# Patient Record
Sex: Female | Born: 1950 | Race: White | Hispanic: No | Smoking: Never smoker
Health system: Southern US, Community
[De-identification: ages and names within clinical notes are randomized; demographics above are authoritative.]

## PROBLEM LIST (undated history)

## (undated) DIAGNOSIS — R000 Tachycardia, unspecified: Secondary | ICD-10-CM

## (undated) DIAGNOSIS — M1990 Unspecified osteoarthritis, unspecified site: Secondary | ICD-10-CM

## (undated) HISTORY — PX: ABDOMINAL HYSTERECTOMY: SHX81

## (undated) HISTORY — DX: Tachycardia, unspecified: R00.0

## (undated) HISTORY — PX: BREAST EXCISIONAL BIOPSY: SUR124

## (undated) HISTORY — PX: OTHER SURGICAL HISTORY: SHX169

---

## 2017-04-13 ENCOUNTER — Telehealth: Payer: Self-pay | Admitting: *Deleted

## 2017-04-13 NOTE — Telephone Encounter (Signed)
Call and left message for patient to call and schedule appointment--patient requested her records be transferred to [facility]

## 2017-04-15 ENCOUNTER — Encounter: Payer: Self-pay | Admitting: Physician Assistant

## 2017-04-15 ENCOUNTER — Other Ambulatory Visit: Payer: Self-pay | Admitting: Physician Assistant

## 2017-04-15 ENCOUNTER — Telehealth: Payer: Self-pay | Admitting: Cardiovascular Disease

## 2017-04-15 DIAGNOSIS — R000 Tachycardia, unspecified: Secondary | ICD-10-CM | POA: Insufficient documentation

## 2017-04-15 MED ORDER — METOPROLOL TARTRATE 50 MG PO TABS: 75.0000 mg | ORAL_TABLET | Freq: Two times a day (BID) | ORAL | 11 refills | Status: DC

## 2017-04-15 NOTE — Telephone Encounter (Signed)
Received records from [facility] for appointment on 04/20/17 with Dr Gwenlyn Found.  Records put with Dr Kennon Holter schedule for 04/20/17. lp

## 2017-04-15 NOTE — Progress Notes (Signed)
Pt will be seeing Dr Gwenlyn Found next week as a new pt.  However, she does not have enough metoprolol to last. Spoke with Dr Gwenlyn Found and got his permission to call in a short-term rx for her.  Emphasized need to keep appt. Pt states she will do so. (she is my sister)  Lenoard Aden 04/15/2017 11:40 AM Beeper [phone]

## 2017-04-16 ENCOUNTER — Telehealth: Payer: Self-pay | Admitting: Cardiovascular Disease

## 2017-04-16 NOTE — Telephone Encounter (Signed)
On 04/16/17 received records from [facility] for upcoming appointment on 04/20/17 @ 9:45am with Dr. Gwenlyn Found. Records given to [facility]. ab

## 2017-04-20 ENCOUNTER — Encounter: Payer: Self-pay | Admitting: Cardiovascular Disease

## 2017-04-20 ENCOUNTER — Ambulatory Visit (INDEPENDENT_AMBULATORY_CARE_PROVIDER_SITE_OTHER): Payer: Medicare HMO | Admitting: Cardiovascular Disease

## 2017-04-20 ENCOUNTER — Encounter (INDEPENDENT_AMBULATORY_CARE_PROVIDER_SITE_OTHER): Payer: Self-pay

## 2017-04-20 VITALS — BP 118/84 | HR 68 | Ht 67.0 in | Wt 188.8 lb

## 2017-04-20 DIAGNOSIS — G4733 Obstructive sleep apnea (adult) (pediatric): Secondary | ICD-10-CM

## 2017-04-20 DIAGNOSIS — R002 Palpitations: Secondary | ICD-10-CM

## 2017-04-20 DIAGNOSIS — I341 Nonrheumatic mitral (valve) prolapse: Secondary | ICD-10-CM

## 2017-04-20 MED ORDER — METOPROLOL TARTRATE 50 MG PO TABS: 75.0000 mg | ORAL_TABLET | Freq: Two times a day (BID) | ORAL | 11 refills | Status: DC

## 2017-04-20 MED ORDER — HYDROXYCHLOROQUINE SULFATE 200 MG PO TABS: 400.0000 mg | ORAL_TABLET | Freq: Every day | ORAL | 3 refills | Status: AC

## 2017-04-20 NOTE — Patient Instructions (Signed)
Medication Instructions: Your physician recommends that you continue on your current medications as directed. Please refer to the Current Medication list given to you today.  Labwork: Your physician recommends that you return for FASTING lab work.  Testing/Procedures: Your physician has requested that you have an echocardiogram. Echocardiography is a painless test that uses sound waves to create images of your heart. It provides your doctor with information about the size and shape of your heart and how well your heart's chambers and valves are working. This procedure takes approximately one hour. There are no restrictions for this procedure.   Follow-Up: Your physician wants you to follow-up in: 1 year with Dr. Gwenlyn Found. You will receive a reminder letter in the mail two months in advance. If you don't receive a letter, please call our office to schedule the follow-up appointment.  Dr. Gwenlyn Found has referred you to Dr. Claiborne Billings for evaluation of sleep apnea.

## 2017-04-20 NOTE — Addendum Note (Signed)
Addended by: Zebedee Iba on: 04/20/2017 04:33 PM   Modules accepted: Orders

## 2017-04-20 NOTE — Assessment & Plan Note (Signed)
History of obstructive sleep apnea which has been evaluated in the past but she is not wearing CPAP. This may be contributing to her palpitations. I will have her reevaluated for this.

## 2017-04-20 NOTE — Progress Notes (Signed)
     04/20/2017 Ruth Sharp   1951/07/23  [id number]  Primary Physician Patient, No Pcp Per Primary Cardiologist: Lorretta Harp MD Garret Reddish, [place], [place]  HPI:  Ruth Sharp is a 66 y.o. female , separated, mother of one child with no grandchildren who worked in a gym in the past and recently relocated from [place] down to [place] approximately 3 months ago to be closer to family. She had been seeing cardiology group in [place]. She has a history of palpitations thought to be related to PACs on event monitor on low-dose beta blocker. She has mild mitral valve prolapse with mild to moderate MR by 2-D echo and a negative stress test in the past. She does have symptoms of obstructive sleep apnea as well.   Current Meds  Medication Sig  . Calcium Carbonate-Vitamin D (CALTRATE 600+D PO) Take 1 tablet by mouth daily.  . hydroxychloroquine (PLAQUENIL) 200 MG tablet Take 2 tablets by mouth daily.  . metoprolol tartrate (LOPRESSOR) 50 MG tablet Take 1.5 tablets (75 mg total) by mouth 2 (two) times daily.     Allergies  Allergen Reactions  . Keflex [Cephalexin]     Social History   Social History  . Marital status: Legally Separated    Spouse name: N/A  . Number of children: N/A  . Years of education: N/A   Occupational History  . Not on file.   Social History Main Topics  . Smoking status: Never Smoker  . Smokeless tobacco: Never Used  . Alcohol use Not on file  . Drug use: Unknown  . Sexual activity: Not on file   Other Topics Concern  . Not on file   Social History Narrative  . No narrative on file     Review of Systems: General: negative for chills, fever, night sweats or weight changes.  Cardiovascular: negative for chest pain, dyspnea on exertion, edema, orthopnea, palpitations, paroxysmal nocturnal dyspnea or shortness of breath Dermatological: negative for rash Respiratory: negative for cough or wheezing Urologic: negative for  hematuria Abdominal: negative for nausea, vomiting, diarrhea, bright red blood per rectum, melena, or hematemesis Neurologic: negative for visual changes, syncope, or dizziness All other systems reviewed and are otherwise negative except as noted above.    Blood pressure 118/84, pulse 68, height 5\' 7"  (1.702 m), weight 188 lb 12.8 oz (85.6 kg), SpO2 97 %.  General appearance: alert and no distress Neck: no adenopathy, no carotid bruit, no JVD, supple, symmetrical, trachea midline and thyroid not enlarged, symmetric, no tenderness/mass/nodules Lungs: clear to auscultation bilaterally Heart: regular rate and rhythm, S1, S2 normal, no murmur, click, rub or gallop Extremities: extremities normal, atraumatic, no cyanosis or edema  EKG sinus rhythm at 68 without ST or T-wave changes. Personally reviewed this EKG.  ASSESSMENT AND PLAN:   Tachycardia Event monitor has shown PACs in the past. She is on low-dose beta blocker for this  Mitral valve prolapse History of nonrheumatic mild mitral valve prolapse P2 by 2-D echo in the past by her primary cardiologist with moderate MR. There is no pulmonary hypertension. We will repeat a 2-D echocardiogram.  Obstructive sleep apnea History of obstructive sleep apnea which has been evaluated in the past but she is not wearing CPAP. This may be contributing to her palpitations. I will have her reevaluated for this.      Lorretta Harp MD FACP,FACC,FAHA, [facility] 04/20/2017 10:19 AM

## 2017-04-20 NOTE — Assessment & Plan Note (Signed)
History of nonrheumatic mild mitral valve prolapse P2 by 2-D echo in the past by her primary cardiologist with moderate MR. There is no pulmonary hypertension. We will repeat a 2-D echocardiogram.

## 2017-04-20 NOTE — Assessment & Plan Note (Signed)
Event monitor has shown PACs in the past. She is on low-dose beta blocker for this

## 2017-04-21 LAB — LIPID PANEL
Chol/HDL Ratio: 2.9 ratio (ref 0.0–4.4)
Cholesterol, Total: 231 mg/dL — ABNORMAL HIGH (ref 100–199)
HDL: 80 mg/dL (ref >39)
LDL Calculated: 126 mg/dL — ABNORMAL HIGH (ref 0–99)
Triglycerides: 125 mg/dL (ref 0–149)
VLDL Cholesterol Cal: 25 mg/dL (ref 5–40)

## 2017-04-21 LAB — CBC WITH DIFFERENTIAL/PLATELET
Basophils Absolute: 0 10*3/uL (ref 0.0–0.2)
Basos: 1 %
EOS (ABSOLUTE): 0.2 10*3/uL (ref 0.0–0.4)
Eos: 4 %
Hematocrit: 46.4 % (ref 34.0–46.6)
Hemoglobin: 15.2 g/dL (ref 11.1–15.9)
Immature Grans (Abs): 0 10*3/uL (ref 0.0–0.1)
Immature Granulocytes: 0 %
Lymphocytes Absolute: 1.4 10*3/uL (ref 0.7–3.1)
Lymphs: 23 %
MCH: 27.3 pg (ref 26.6–33.0)
MCHC: 32.8 g/dL (ref 31.5–35.7)
MCV: 83 fL (ref 79–97)
Monocytes Absolute: 0.4 10*3/uL (ref 0.1–0.9)
Monocytes: 6 %
Neutrophils Absolute: 3.9 10*3/uL (ref 1.4–7.0)
Neutrophils: 66 %
Platelets: 590 10*3/uL — ABNORMAL HIGH (ref 150–379)
RBC: 5.57 x10E6/uL — ABNORMAL HIGH (ref 3.77–5.28)
RDW: 14.8 % (ref 12.3–15.4)
WBC: 5.9 10*3/uL (ref 3.4–10.8)

## 2017-04-21 LAB — T4, FREE: Free T4: 1.1 ng/dL (ref 0.82–1.77)

## 2017-04-21 LAB — HEPATIC FUNCTION PANEL
ALT: 21 IU/L (ref 0–32)
AST: 29 IU/L (ref 0–40)
Albumin: 4.7 g/dL (ref 3.6–4.8)
Alkaline Phosphatase: 85 IU/L (ref 39–117)
Bilirubin Total: 0.5 mg/dL (ref 0.0–1.2)
Bilirubin, Direct: 0.13 mg/dL (ref 0.00–0.40)
Total Protein: 7.1 g/dL (ref 6.0–8.5)

## 2017-04-21 LAB — BASIC METABOLIC PANEL
BUN/Creatinine Ratio: 21 (ref 12–28)
BUN: 16 mg/dL (ref 8–27)
CO2: 21 mmol/L (ref 20–29)
Calcium: 9.7 mg/dL (ref 8.7–10.3)
Chloride: 105 mmol/L (ref 96–106)
Creatinine, Ser: 0.75 mg/dL (ref 0.57–1.00)
GFR calc Af Amer: 96 mL/min/{1.73_m2} (ref >59)
GFR calc non Af Amer: 83 mL/min/{1.73_m2} (ref >59)
Glucose: 106 mg/dL — ABNORMAL HIGH (ref 65–99)
Potassium: 5.4 mmol/L — ABNORMAL HIGH (ref 3.5–5.2)
Sodium: 142 mmol/L (ref 134–144)

## 2017-04-21 LAB — HEMOGLOBIN A1C
Est. average glucose Bld gHb Est-mCnc: 123 mg/dL
Hgb A1c MFr Bld: 5.9 % — ABNORMAL HIGH (ref 4.8–5.6)

## 2017-04-21 LAB — TSH: TSH: 1.88 u[IU]/mL (ref 0.450–4.500)

## 2017-04-26 ENCOUNTER — Other Ambulatory Visit: Payer: Self-pay | Admitting: Cardiovascular Disease

## 2017-04-26 ENCOUNTER — Encounter: Payer: Self-pay | Admitting: Cardiovascular Disease

## 2017-04-26 DIAGNOSIS — R000 Tachycardia, unspecified: Secondary | ICD-10-CM

## 2017-04-26 DIAGNOSIS — I341 Nonrheumatic mitral (valve) prolapse: Secondary | ICD-10-CM

## 2017-04-26 NOTE — Patient Instructions (Signed)

## 2017-05-04 ENCOUNTER — Other Ambulatory Visit: Payer: Self-pay

## 2017-05-04 ENCOUNTER — Ambulatory Visit (HOSPITAL_COMMUNITY): Payer: Medicare HMO | Attending: Cardiovascular Disease

## 2017-05-04 DIAGNOSIS — I341 Nonrheumatic mitral (valve) prolapse: Secondary | ICD-10-CM | POA: Insufficient documentation

## 2017-05-04 DIAGNOSIS — R002 Palpitations: Secondary | ICD-10-CM | POA: Diagnosis not present

## 2017-05-04 DIAGNOSIS — M9902 Segmental and somatic dysfunction of thoracic region: Secondary | ICD-10-CM | POA: Diagnosis not present

## 2017-05-04 DIAGNOSIS — I340 Nonrheumatic mitral (valve) insufficiency: Secondary | ICD-10-CM | POA: Insufficient documentation

## 2017-05-04 DIAGNOSIS — M791 Myalgia: Secondary | ICD-10-CM | POA: Diagnosis not present

## 2017-05-04 DIAGNOSIS — M9901 Segmental and somatic dysfunction of cervical region: Secondary | ICD-10-CM | POA: Diagnosis not present

## 2017-05-04 DIAGNOSIS — G4733 Obstructive sleep apnea (adult) (pediatric): Secondary | ICD-10-CM | POA: Diagnosis not present

## 2017-05-04 DIAGNOSIS — M47814 Spondylosis without myelopathy or radiculopathy, thoracic region: Secondary | ICD-10-CM | POA: Diagnosis not present

## 2017-05-04 DIAGNOSIS — M62830 Muscle spasm of back: Secondary | ICD-10-CM | POA: Diagnosis not present

## 2017-05-04 DIAGNOSIS — M50323 Other cervical disc degeneration at C6-C7 level: Secondary | ICD-10-CM | POA: Diagnosis not present

## 2017-05-07 DIAGNOSIS — M47814 Spondylosis without myelopathy or radiculopathy, thoracic region: Secondary | ICD-10-CM | POA: Diagnosis not present

## 2017-05-07 DIAGNOSIS — M9902 Segmental and somatic dysfunction of thoracic region: Secondary | ICD-10-CM | POA: Diagnosis not present

## 2017-05-07 DIAGNOSIS — M50323 Other cervical disc degeneration at C6-C7 level: Secondary | ICD-10-CM | POA: Diagnosis not present

## 2017-05-07 DIAGNOSIS — M9901 Segmental and somatic dysfunction of cervical region: Secondary | ICD-10-CM | POA: Diagnosis not present

## 2017-05-07 DIAGNOSIS — M791 Myalgia: Secondary | ICD-10-CM | POA: Diagnosis not present

## 2017-05-07 DIAGNOSIS — M62830 Muscle spasm of back: Secondary | ICD-10-CM | POA: Diagnosis not present

## 2017-05-10 DIAGNOSIS — M9902 Segmental and somatic dysfunction of thoracic region: Secondary | ICD-10-CM | POA: Diagnosis not present

## 2017-05-10 DIAGNOSIS — M9901 Segmental and somatic dysfunction of cervical region: Secondary | ICD-10-CM | POA: Diagnosis not present

## 2017-05-10 DIAGNOSIS — M50323 Other cervical disc degeneration at C6-C7 level: Secondary | ICD-10-CM | POA: Diagnosis not present

## 2017-05-10 DIAGNOSIS — M47814 Spondylosis without myelopathy or radiculopathy, thoracic region: Secondary | ICD-10-CM | POA: Diagnosis not present

## 2017-05-13 DIAGNOSIS — M47814 Spondylosis without myelopathy or radiculopathy, thoracic region: Secondary | ICD-10-CM | POA: Diagnosis not present

## 2017-05-13 DIAGNOSIS — M9901 Segmental and somatic dysfunction of cervical region: Secondary | ICD-10-CM | POA: Diagnosis not present

## 2017-05-13 DIAGNOSIS — M50323 Other cervical disc degeneration at C6-C7 level: Secondary | ICD-10-CM | POA: Diagnosis not present

## 2017-05-13 DIAGNOSIS — M9902 Segmental and somatic dysfunction of thoracic region: Secondary | ICD-10-CM | POA: Diagnosis not present

## 2017-05-18 DIAGNOSIS — M9901 Segmental and somatic dysfunction of cervical region: Secondary | ICD-10-CM | POA: Diagnosis not present

## 2017-05-18 DIAGNOSIS — M50323 Other cervical disc degeneration at C6-C7 level: Secondary | ICD-10-CM | POA: Diagnosis not present

## 2017-05-18 DIAGNOSIS — M9902 Segmental and somatic dysfunction of thoracic region: Secondary | ICD-10-CM | POA: Diagnosis not present

## 2017-05-18 DIAGNOSIS — M47814 Spondylosis without myelopathy or radiculopathy, thoracic region: Secondary | ICD-10-CM | POA: Diagnosis not present

## 2017-05-20 DIAGNOSIS — M9902 Segmental and somatic dysfunction of thoracic region: Secondary | ICD-10-CM | POA: Diagnosis not present

## 2017-05-20 DIAGNOSIS — M50323 Other cervical disc degeneration at C6-C7 level: Secondary | ICD-10-CM | POA: Diagnosis not present

## 2017-05-20 DIAGNOSIS — M9901 Segmental and somatic dysfunction of cervical region: Secondary | ICD-10-CM | POA: Diagnosis not present

## 2017-05-20 DIAGNOSIS — M47814 Spondylosis without myelopathy or radiculopathy, thoracic region: Secondary | ICD-10-CM | POA: Diagnosis not present

## 2017-05-24 DIAGNOSIS — M47814 Spondylosis without myelopathy or radiculopathy, thoracic region: Secondary | ICD-10-CM | POA: Diagnosis not present

## 2017-05-24 DIAGNOSIS — M50323 Other cervical disc degeneration at C6-C7 level: Secondary | ICD-10-CM | POA: Diagnosis not present

## 2017-05-24 DIAGNOSIS — M9902 Segmental and somatic dysfunction of thoracic region: Secondary | ICD-10-CM | POA: Diagnosis not present

## 2017-05-24 DIAGNOSIS — M9901 Segmental and somatic dysfunction of cervical region: Secondary | ICD-10-CM | POA: Diagnosis not present

## 2017-05-31 DIAGNOSIS — M50323 Other cervical disc degeneration at C6-C7 level: Secondary | ICD-10-CM | POA: Diagnosis not present

## 2017-05-31 DIAGNOSIS — M9902 Segmental and somatic dysfunction of thoracic region: Secondary | ICD-10-CM | POA: Diagnosis not present

## 2017-05-31 DIAGNOSIS — M47814 Spondylosis without myelopathy or radiculopathy, thoracic region: Secondary | ICD-10-CM | POA: Diagnosis not present

## 2017-05-31 DIAGNOSIS — M9901 Segmental and somatic dysfunction of cervical region: Secondary | ICD-10-CM | POA: Diagnosis not present

## 2017-06-03 DIAGNOSIS — M47814 Spondylosis without myelopathy or radiculopathy, thoracic region: Secondary | ICD-10-CM | POA: Diagnosis not present

## 2017-06-03 DIAGNOSIS — M9901 Segmental and somatic dysfunction of cervical region: Secondary | ICD-10-CM | POA: Diagnosis not present

## 2017-06-03 DIAGNOSIS — M9902 Segmental and somatic dysfunction of thoracic region: Secondary | ICD-10-CM | POA: Diagnosis not present

## 2017-06-03 DIAGNOSIS — M50323 Other cervical disc degeneration at C6-C7 level: Secondary | ICD-10-CM | POA: Diagnosis not present

## 2017-06-09 DIAGNOSIS — M9901 Segmental and somatic dysfunction of cervical region: Secondary | ICD-10-CM | POA: Diagnosis not present

## 2017-06-09 DIAGNOSIS — M9902 Segmental and somatic dysfunction of thoracic region: Secondary | ICD-10-CM | POA: Diagnosis not present

## 2017-06-09 DIAGNOSIS — M50323 Other cervical disc degeneration at C6-C7 level: Secondary | ICD-10-CM | POA: Diagnosis not present

## 2017-06-09 DIAGNOSIS — M47814 Spondylosis without myelopathy or radiculopathy, thoracic region: Secondary | ICD-10-CM | POA: Diagnosis not present

## 2017-06-10 ENCOUNTER — Encounter: Payer: Self-pay | Admitting: Cardiovascular Disease

## 2017-06-10 ENCOUNTER — Ambulatory Visit (INDEPENDENT_AMBULATORY_CARE_PROVIDER_SITE_OTHER): Payer: Medicare HMO | Admitting: Cardiovascular Disease

## 2017-06-10 VITALS — BP 120/79 | HR 69 | Ht 67.0 in | Wt 188.6 lb

## 2017-06-10 DIAGNOSIS — I341 Nonrheumatic mitral (valve) prolapse: Secondary | ICD-10-CM | POA: Diagnosis not present

## 2017-06-10 DIAGNOSIS — G4733 Obstructive sleep apnea (adult) (pediatric): Secondary | ICD-10-CM | POA: Diagnosis not present

## 2017-06-10 DIAGNOSIS — I340 Nonrheumatic mitral (valve) insufficiency: Secondary | ICD-10-CM | POA: Diagnosis not present

## 2017-06-10 DIAGNOSIS — R002 Palpitations: Secondary | ICD-10-CM | POA: Diagnosis not present

## 2017-06-10 DIAGNOSIS — I480 Paroxysmal atrial fibrillation: Secondary | ICD-10-CM | POA: Diagnosis not present

## 2017-06-10 NOTE — Patient Instructions (Signed)
Medication Instructions:  Your physician recommends that you continue on your current medications as directed. Please refer to the Current Medication list given to you today.  Testing/Procedures: Once we receive sleep study, Dr. Claiborne Billings will decide whether to order in lab study vs. home sleep study.  Follow-Up: Pending plan-after set up.  Any Other Special Instructions Will Be Listed Below (If Applicable).     If you need a refill on your cardiac medications before your next appointment, please call your pharmacy.

## 2017-06-12 NOTE — Progress Notes (Signed)
Cardiology Office Note    Date:  06/12/2017   ID:  Ruth Sharp, DOB 01/16/1951, MRN [id number]  PCP:  Patient, No Pcp Per  Cardiologist:  Shelva Majestic, MD   Chief Complaint  Patient presents with  . Follow-up    New sleep evaluation, referred by Dr. Gwenlyn Found.  History of Present Illness:  Ruth Sharp is a 66 y.o. female who is referred by Dr. Gwenlyn Found for evaluation of untreated sleep apnea.  [name] is the sister of Ruth Ferries, PA-C.  She had been living in [place] and earlier this year.  Relocated to [facility] to be closer to family.  She has a history of palpitations thought to be related to PACs on event monitoring and she has been on low-dose beta blocker therapy.  She relates a possible history of PAF. She has a history of mild mitral valve prolapse with mild-to-moderate MR by echo and remotely had a negative stress test.  Last year she apparently underwent an in lab sleep evaluation in [place] and subsequently had a CPAP titration study.  However, she was never set up with CPAP therapy and when she called the DME company.  He never returned her call and she never followed up with this.  The physician who had ordered her study was Dr. Broadus John in [place], but she never notified them that she was never set up for her CPAP.  Over the past year, she admits to some sleepiness during the day.  She typically goes to bed around midnight, wakes up at 7 to 7:30 AM.  She oftentimes wakes up 2 times per night.  Her sleep at times is nonrestorative.  She denies any bruxism, restless legs,  hypnogognic hallucinations or cataplexy. An Epworth Sleepiness Scale was calculated in the office today and this endorsed at 10 and shown below:   Epworth Sleepiness Scale: Situation   Chance of Dozing/Sleeping (0 = never , 1 = slight chance , 2 = moderate chance , 3 = high chance )   sitting and reading 1   watching TV 2   sitting inactive in a public place 1   being a  passenger in a motor vehicle for an hour or more 1   lying down in the afternoon 3   sitting and talking to someone 0   sitting quietly after lunch (no alcohol) 2   while stopped for a few minutes in traffic as the driver 0   Total Score  10   She presents for a new sleep evaluation.   Past Medical History:  Diagnosis Date  . Tachycardia     No past surgical history on file.  Current Medications: Outpatient Medications Prior to Visit  Medication Sig Dispense Refill  . Calcium Carbonate-Vitamin D (CALTRATE 600+D PO) Take 1 tablet by mouth daily.    . hydroxychloroquine (PLAQUENIL) 200 MG tablet Take 2 tablets (400 mg total) by mouth daily. 90 tablet 3  . metoprolol tartrate (LOPRESSOR) 50 MG tablet Take 1.5 tablets (75 mg total) by mouth 2 (two) times daily. 92 tablet 11   No facility-administered medications prior to visit.      Allergies:   Keflex [cephalexin]   Social History   Social History  . Marital status: Legally Separated    Spouse name: N/A  . Number of children: N/A  . Years of education: N/A   Social History Main Topics  . Smoking status: Never Smoker  . Smokeless tobacco: Never Used  .  Alcohol use None  . Drug use: Unknown  . Sexual activity: Not Asked   Other Topics Concern  . None   Social History Narrative  . None    She is separated.  She has one son age 83.  Family History:  The patient's family history includes Heart failure in her mother.  Her mother had sudden death at age 52.  Father died with melanoma.  ROS General: Negative; No fevers, chills, or night sweats;  HEENT: Negative; No changes in vision or hearing, sinus congestion, difficulty swallowing Pulmonary: Negative; No cough, wheezing, shortness of breath, hemoptysis Cardiovascular: See history of present illness GI: Negative; No nausea, vomiting, diarrhea, or abdominal pain GU: Negative; No dysuria, hematuria, or difficulty voiding Musculoskeletal: Negative; no myalgias, joint  pain, or weakness Hematologic/Oncology: Negative; no easy bruising, bleeding Endocrine: Negative; no heat/cold intolerance; no diabetes Neuro: Negative; no changes in balance, headaches Skin: Negative; No rashes or skin lesions Psychiatric: Negative; No behavioral problems, depression Sleep: See history of present illness  Other comprehensive 14 point system review is negative.   PHYSICAL EXAM:   VS:  BP 120/79   Pulse 69   Ht '5\' 7"'$  (1.702 m)   Wt 188 lb 9.6 oz (85.5 kg)   BMI 29.54 kg/m     Repeat blood pressure by me was 124/82. Wt Readings from Last 3 Encounters:  06/10/17 188 lb 9.6 oz (85.5 kg)  04/20/17 188 lb 12.8 oz (85.6 kg)    General: Alert, oriented, no distress.  Skin: normal turgor, no rashes, warm and dry HEENT: Normocephalic, atraumatic. Pupils equal round and reactive to light; sclera anicteric; extraocular muscles intact; Fundi Without hemorrhages or exudates Nose without nasal septal hypertrophy Mouth/Parynx benign; Mallinpatti scale 3 Neck: No JVD, no carotid bruits; normal carotid upstroke Lungs: clear to ausculatation and percussion; no wheezing or rales Chest wall: without tenderness to palpitation Heart: PMI not displaced, RRR, s1 s2 normal, 1/6 systolic murmur, no diastolic murmur; possible intermittent systolic click, no rubs, gallops, thrills, or heaves Abdomen: soft, nontender; no hepatosplenomehaly, BS+; abdominal aorta nontender and not dilated by palpation. Back: no CVA tenderness Pulses 2+ Musculoskeletal: full range of motion, normal strength, no joint deformities Extremities: no clubbing cyanosis or edema, Homan's sign negative  Neurologic: grossly nonfocal; Cranial nerves grossly wnl Psychologic: Normal mood and affect   Studies/Labs Reviewed:    Recent Labs: BMP Latest Ref Rng & Units 04/20/2017  Glucose 65 - 99 mg/dL 106(H)  BUN 8 - 27 mg/dL 16  Creatinine 0.57 - 1.00 mg/dL 0.75  BUN/Creat Ratio 12 - 28 21  Sodium 134 - 144 mmol/L  142  Potassium 3.5 - 5.2 mmol/L 5.4(H)  Chloride 96 - 106 mmol/L 105  CO2 20 - 29 mmol/L 21  Calcium 8.7 - 10.3 mg/dL 9.7     Hepatic Function Latest Ref Rng & Units 04/20/2017  Total Protein 6.0 - 8.5 g/dL 7.1  Albumin 3.6 - 4.8 g/dL 4.7  AST 0 - 40 IU/L 29  ALT 0 - 32 IU/L 21  Alk Phosphatase 39 - 117 IU/L 85  Total Bilirubin 0.0 - 1.2 mg/dL 0.5  Bilirubin, Direct 0.00 - 0.40 mg/dL 0.13    CBC Latest Ref Rng & Units 04/20/2017  WBC 3.4 - 10.8 x10E3/uL 5.9  Hemoglobin 11.1 - 15.9 g/dL 15.2  Hematocrit 34.0 - 46.6 % 46.4  Platelets 150 - 379 x10E3/uL 590(H)   Lab Results  Component Value Date   MCV 83 04/20/2017   Lab Results  Component Value Date   TSH 1.880 04/20/2017   Lab Results  Component Value Date   HGBA1C 5.9 (H) 04/20/2017     BNP No results found for: BNP  ProBNP No results found for: PROBNP   Lipid Panel     Component Value Date/Time   CHOL 231 (H) 04/20/2017 1033   TRIG 125 04/20/2017 1033   HDL 80 04/20/2017 1033   CHOLHDL 2.9 04/20/2017 1033   LDLCALC 126 (H) 04/20/2017 1033     RADIOLOGY: No results found.   Additional studies/ records that were reviewed today include:  I reviewed the records by Dr. Gwenlyn Found.  I've requested.  The patient's sleep studies from her pulmonologist in [place], [place] Dr. Deretha Emory.   ASSESSMENT:    No diagnosis found.   PLAN:  Ms. Mersereau is a very pleasant 66 year old female who has a history of previously documented mitral valve prolapse with mild-to-moderate MR, and documented PACs in the past on event monitoring for which she has been on low-dose beta blocker.  She states she may have had an episode of atrial fibrillation in the past.  She was referred for sleep study in early 2017 and was told of having sleep apnea.  She apparently underwent CPAP titration.  She never received a CPAP machine.  I will try to obtain her records from [place], including her diagnostic polysomnogram as well as CPAP  titration trial.  It is over a year since her evaluations.  He will depend upon her insurance whether or not we can initiate CPAP therapy, but I suspect since it is over a year.  She may require another evaluation.  The pending upon the severity of her sleep apnea, it is possible may be we can perform a home study or if not, we will try to set her up for split-night evaluation.  If another study is necessary.  I discussed with her at length the adverse consequences of months, treated sleep apnea with reference to her cardiovascular health.  I will see her back in follow-up following obtaining a CPAP machine for additional evaluation, treatment, and assessment of compliance.  Medication Adjustments/Labs and Tests Ordered: Current medicines are reviewed at length with the patient today.  Concerns regarding medicines are outlined above.  Medication changes, Labs and Tests ordered today are listed in the Patient Instructions below. Patient Instructions  Medication Instructions:  Your physician recommends that you continue on your current medications as directed. Please refer to the Current Medication list given to you today.  Testing/Procedures: Once we receive sleep study, Dr. Claiborne Billings will decide whether to order in lab study vs. home sleep study.  Follow-Up: Pending plan-after set up.  Any Other Special Instructions Will Be Listed Below (If Applicable).     If you need a refill on your cardiac medications before your next appointment, please call your pharmacy.      Signed, Shelva Majestic, MD  06/12/2017 9:12 AM    [facility] [address] [place], [place],   [address] Phone: [phone]

## 2017-06-16 DIAGNOSIS — M9902 Segmental and somatic dysfunction of thoracic region: Secondary | ICD-10-CM | POA: Diagnosis not present

## 2017-06-16 DIAGNOSIS — M50323 Other cervical disc degeneration at C6-C7 level: Secondary | ICD-10-CM | POA: Diagnosis not present

## 2017-06-16 DIAGNOSIS — M47814 Spondylosis without myelopathy or radiculopathy, thoracic region: Secondary | ICD-10-CM | POA: Diagnosis not present

## 2017-06-16 DIAGNOSIS — M9901 Segmental and somatic dysfunction of cervical region: Secondary | ICD-10-CM | POA: Diagnosis not present

## 2017-06-23 DIAGNOSIS — M9902 Segmental and somatic dysfunction of thoracic region: Secondary | ICD-10-CM | POA: Diagnosis not present

## 2017-06-23 DIAGNOSIS — M47814 Spondylosis without myelopathy or radiculopathy, thoracic region: Secondary | ICD-10-CM | POA: Diagnosis not present

## 2017-06-23 DIAGNOSIS — M50323 Other cervical disc degeneration at C6-C7 level: Secondary | ICD-10-CM | POA: Diagnosis not present

## 2017-06-23 DIAGNOSIS — M9901 Segmental and somatic dysfunction of cervical region: Secondary | ICD-10-CM | POA: Diagnosis not present

## 2017-06-30 DIAGNOSIS — M9902 Segmental and somatic dysfunction of thoracic region: Secondary | ICD-10-CM | POA: Diagnosis not present

## 2017-06-30 DIAGNOSIS — M9901 Segmental and somatic dysfunction of cervical region: Secondary | ICD-10-CM | POA: Diagnosis not present

## 2017-06-30 DIAGNOSIS — M47814 Spondylosis without myelopathy or radiculopathy, thoracic region: Secondary | ICD-10-CM | POA: Diagnosis not present

## 2017-06-30 DIAGNOSIS — M50323 Other cervical disc degeneration at C6-C7 level: Secondary | ICD-10-CM | POA: Diagnosis not present

## 2017-07-07 DIAGNOSIS — M9901 Segmental and somatic dysfunction of cervical region: Secondary | ICD-10-CM | POA: Diagnosis not present

## 2017-07-07 DIAGNOSIS — M47814 Spondylosis without myelopathy or radiculopathy, thoracic region: Secondary | ICD-10-CM | POA: Diagnosis not present

## 2017-07-07 DIAGNOSIS — M50323 Other cervical disc degeneration at C6-C7 level: Secondary | ICD-10-CM | POA: Diagnosis not present

## 2017-07-07 DIAGNOSIS — M9902 Segmental and somatic dysfunction of thoracic region: Secondary | ICD-10-CM | POA: Diagnosis not present

## 2017-07-14 DIAGNOSIS — M47814 Spondylosis without myelopathy or radiculopathy, thoracic region: Secondary | ICD-10-CM | POA: Diagnosis not present

## 2017-07-14 DIAGNOSIS — M9901 Segmental and somatic dysfunction of cervical region: Secondary | ICD-10-CM | POA: Diagnosis not present

## 2017-07-14 DIAGNOSIS — M50323 Other cervical disc degeneration at C6-C7 level: Secondary | ICD-10-CM | POA: Diagnosis not present

## 2017-07-14 DIAGNOSIS — M9902 Segmental and somatic dysfunction of thoracic region: Secondary | ICD-10-CM | POA: Diagnosis not present

## 2017-07-19 DIAGNOSIS — R69 Illness, unspecified: Secondary | ICD-10-CM | POA: Diagnosis not present

## 2017-07-21 DIAGNOSIS — M9902 Segmental and somatic dysfunction of thoracic region: Secondary | ICD-10-CM | POA: Diagnosis not present

## 2017-07-21 DIAGNOSIS — M9901 Segmental and somatic dysfunction of cervical region: Secondary | ICD-10-CM | POA: Diagnosis not present

## 2017-07-21 DIAGNOSIS — M47814 Spondylosis without myelopathy or radiculopathy, thoracic region: Secondary | ICD-10-CM | POA: Diagnosis not present

## 2017-07-21 DIAGNOSIS — M50323 Other cervical disc degeneration at C6-C7 level: Secondary | ICD-10-CM | POA: Diagnosis not present

## 2017-07-28 DIAGNOSIS — M50323 Other cervical disc degeneration at C6-C7 level: Secondary | ICD-10-CM | POA: Diagnosis not present

## 2017-07-28 DIAGNOSIS — M9901 Segmental and somatic dysfunction of cervical region: Secondary | ICD-10-CM | POA: Diagnosis not present

## 2017-07-28 DIAGNOSIS — M47814 Spondylosis without myelopathy or radiculopathy, thoracic region: Secondary | ICD-10-CM | POA: Diagnosis not present

## 2017-07-28 DIAGNOSIS — M9902 Segmental and somatic dysfunction of thoracic region: Secondary | ICD-10-CM | POA: Diagnosis not present

## 2017-08-02 DIAGNOSIS — Z6831 Body mass index (BMI) 31.0-31.9, adult: Secondary | ICD-10-CM | POA: Diagnosis not present

## 2017-08-02 DIAGNOSIS — I10 Essential (primary) hypertension: Secondary | ICD-10-CM | POA: Diagnosis not present

## 2017-08-02 DIAGNOSIS — M2550 Pain in unspecified joint: Secondary | ICD-10-CM | POA: Diagnosis not present

## 2017-08-02 DIAGNOSIS — H04129 Dry eye syndrome of unspecified lacrimal gland: Secondary | ICD-10-CM | POA: Diagnosis not present

## 2017-08-02 DIAGNOSIS — Z0000 Encounter for general adult medical examination without abnormal findings: Secondary | ICD-10-CM | POA: Diagnosis not present

## 2017-08-02 DIAGNOSIS — E669 Obesity, unspecified: Secondary | ICD-10-CM | POA: Diagnosis not present

## 2017-08-03 ENCOUNTER — Telehealth: Payer: Self-pay | Admitting: *Deleted

## 2017-08-03 NOTE — Telephone Encounter (Signed)
Sleep study and CPAP titration study received from Dr. Monia Pouch.   Ok to try to set up for CPAP per Dr. Claiborne Billings.  Additional sleep study may be necessary since study was 02/2016.    Need to obtain OV note from Dr. Ginny Forth in order to fax orders.      Called patient to notify-left detailed message (ok per DPR) making aware that once I receive OV note from Dr. Ginny Forth I will send orders for CPAP to see if she can get set up or if another study will be needed.

## 2017-08-09 NOTE — Telephone Encounter (Signed)
Medical records have faxed request for OV note prior to sleep study.

## 2017-08-10 DIAGNOSIS — M9901 Segmental and somatic dysfunction of cervical region: Secondary | ICD-10-CM | POA: Diagnosis not present

## 2017-08-10 DIAGNOSIS — M9902 Segmental and somatic dysfunction of thoracic region: Secondary | ICD-10-CM | POA: Diagnosis not present

## 2017-08-10 DIAGNOSIS — M50323 Other cervical disc degeneration at C6-C7 level: Secondary | ICD-10-CM | POA: Diagnosis not present

## 2017-08-10 DIAGNOSIS — M47814 Spondylosis without myelopathy or radiculopathy, thoracic region: Secondary | ICD-10-CM | POA: Diagnosis not present

## 2017-08-24 DIAGNOSIS — M9901 Segmental and somatic dysfunction of cervical region: Secondary | ICD-10-CM | POA: Diagnosis not present

## 2017-08-24 DIAGNOSIS — M50323 Other cervical disc degeneration at C6-C7 level: Secondary | ICD-10-CM | POA: Diagnosis not present

## 2017-08-24 DIAGNOSIS — M9902 Segmental and somatic dysfunction of thoracic region: Secondary | ICD-10-CM | POA: Diagnosis not present

## 2017-08-24 DIAGNOSIS — M47814 Spondylosis without myelopathy or radiculopathy, thoracic region: Secondary | ICD-10-CM | POA: Diagnosis not present

## 2017-09-09 DIAGNOSIS — M50323 Other cervical disc degeneration at C6-C7 level: Secondary | ICD-10-CM | POA: Diagnosis not present

## 2017-09-09 DIAGNOSIS — M9901 Segmental and somatic dysfunction of cervical region: Secondary | ICD-10-CM | POA: Diagnosis not present

## 2017-09-09 DIAGNOSIS — M47814 Spondylosis without myelopathy or radiculopathy, thoracic region: Secondary | ICD-10-CM | POA: Diagnosis not present

## 2017-09-09 DIAGNOSIS — M9902 Segmental and somatic dysfunction of thoracic region: Secondary | ICD-10-CM | POA: Diagnosis not present

## 2017-09-17 NOTE — Telephone Encounter (Signed)
After multiple attempts, unsuccessful obtaining necessary medical records from Dr. Ginny Forth.   As discussed at [place], may need new sleep study.  Will route to Dr. Claiborne Billings to review.

## 2017-09-20 NOTE — Telephone Encounter (Signed)
Tried additional time to see if the office record which details the need for the sleep study can be obtained

## 2017-09-21 NOTE — Telephone Encounter (Signed)
Medical records has requested records

## 2017-09-28 DIAGNOSIS — M47814 Spondylosis without myelopathy or radiculopathy, thoracic region: Secondary | ICD-10-CM | POA: Diagnosis not present

## 2017-09-28 DIAGNOSIS — M9902 Segmental and somatic dysfunction of thoracic region: Secondary | ICD-10-CM | POA: Diagnosis not present

## 2017-09-28 DIAGNOSIS — M50323 Other cervical disc degeneration at C6-C7 level: Secondary | ICD-10-CM | POA: Diagnosis not present

## 2017-09-28 DIAGNOSIS — M9901 Segmental and somatic dysfunction of cervical region: Secondary | ICD-10-CM | POA: Diagnosis not present

## 2017-10-05 DIAGNOSIS — M9901 Segmental and somatic dysfunction of cervical region: Secondary | ICD-10-CM | POA: Diagnosis not present

## 2017-10-05 DIAGNOSIS — M50323 Other cervical disc degeneration at C6-C7 level: Secondary | ICD-10-CM | POA: Diagnosis not present

## 2017-10-05 DIAGNOSIS — M47814 Spondylosis without myelopathy or radiculopathy, thoracic region: Secondary | ICD-10-CM | POA: Diagnosis not present

## 2017-10-05 DIAGNOSIS — M9902 Segmental and somatic dysfunction of thoracic region: Secondary | ICD-10-CM | POA: Diagnosis not present

## 2017-10-08 DIAGNOSIS — M47814 Spondylosis without myelopathy or radiculopathy, thoracic region: Secondary | ICD-10-CM | POA: Diagnosis not present

## 2017-10-08 DIAGNOSIS — M9901 Segmental and somatic dysfunction of cervical region: Secondary | ICD-10-CM | POA: Diagnosis not present

## 2017-10-08 DIAGNOSIS — M9902 Segmental and somatic dysfunction of thoracic region: Secondary | ICD-10-CM | POA: Diagnosis not present

## 2017-10-08 DIAGNOSIS — M50323 Other cervical disc degeneration at C6-C7 level: Secondary | ICD-10-CM | POA: Diagnosis not present

## 2017-10-12 DIAGNOSIS — M9902 Segmental and somatic dysfunction of thoracic region: Secondary | ICD-10-CM | POA: Diagnosis not present

## 2017-10-12 DIAGNOSIS — M9901 Segmental and somatic dysfunction of cervical region: Secondary | ICD-10-CM | POA: Diagnosis not present

## 2017-10-12 DIAGNOSIS — M50323 Other cervical disc degeneration at C6-C7 level: Secondary | ICD-10-CM | POA: Diagnosis not present

## 2017-10-12 DIAGNOSIS — M47814 Spondylosis without myelopathy or radiculopathy, thoracic region: Secondary | ICD-10-CM | POA: Diagnosis not present

## 2017-10-15 DIAGNOSIS — M47814 Spondylosis without myelopathy or radiculopathy, thoracic region: Secondary | ICD-10-CM | POA: Diagnosis not present

## 2017-10-15 DIAGNOSIS — M50323 Other cervical disc degeneration at C6-C7 level: Secondary | ICD-10-CM | POA: Diagnosis not present

## 2017-10-15 DIAGNOSIS — M9902 Segmental and somatic dysfunction of thoracic region: Secondary | ICD-10-CM | POA: Diagnosis not present

## 2017-10-15 DIAGNOSIS — M9901 Segmental and somatic dysfunction of cervical region: Secondary | ICD-10-CM | POA: Diagnosis not present

## 2017-10-22 DIAGNOSIS — M50323 Other cervical disc degeneration at C6-C7 level: Secondary | ICD-10-CM | POA: Diagnosis not present

## 2017-10-22 DIAGNOSIS — M47814 Spondylosis without myelopathy or radiculopathy, thoracic region: Secondary | ICD-10-CM | POA: Diagnosis not present

## 2017-10-22 DIAGNOSIS — M9902 Segmental and somatic dysfunction of thoracic region: Secondary | ICD-10-CM | POA: Diagnosis not present

## 2017-10-22 DIAGNOSIS — M9901 Segmental and somatic dysfunction of cervical region: Secondary | ICD-10-CM | POA: Diagnosis not present

## 2017-11-02 DIAGNOSIS — M47814 Spondylosis without myelopathy or radiculopathy, thoracic region: Secondary | ICD-10-CM | POA: Diagnosis not present

## 2017-11-02 DIAGNOSIS — M50323 Other cervical disc degeneration at C6-C7 level: Secondary | ICD-10-CM | POA: Diagnosis not present

## 2017-11-02 DIAGNOSIS — M9902 Segmental and somatic dysfunction of thoracic region: Secondary | ICD-10-CM | POA: Diagnosis not present

## 2017-11-02 DIAGNOSIS — M9901 Segmental and somatic dysfunction of cervical region: Secondary | ICD-10-CM | POA: Diagnosis not present

## 2017-11-12 DIAGNOSIS — M47814 Spondylosis without myelopathy or radiculopathy, thoracic region: Secondary | ICD-10-CM | POA: Diagnosis not present

## 2017-11-12 DIAGNOSIS — M9901 Segmental and somatic dysfunction of cervical region: Secondary | ICD-10-CM | POA: Diagnosis not present

## 2017-11-12 DIAGNOSIS — M50323 Other cervical disc degeneration at C6-C7 level: Secondary | ICD-10-CM | POA: Diagnosis not present

## 2017-11-12 DIAGNOSIS — M9902 Segmental and somatic dysfunction of thoracic region: Secondary | ICD-10-CM | POA: Diagnosis not present

## 2017-11-26 DIAGNOSIS — M9901 Segmental and somatic dysfunction of cervical region: Secondary | ICD-10-CM | POA: Diagnosis not present

## 2017-11-26 DIAGNOSIS — M50323 Other cervical disc degeneration at C6-C7 level: Secondary | ICD-10-CM | POA: Diagnosis not present

## 2017-11-26 DIAGNOSIS — M9902 Segmental and somatic dysfunction of thoracic region: Secondary | ICD-10-CM | POA: Diagnosis not present

## 2017-11-26 DIAGNOSIS — M47814 Spondylosis without myelopathy or radiculopathy, thoracic region: Secondary | ICD-10-CM | POA: Diagnosis not present

## 2017-12-10 DIAGNOSIS — M9901 Segmental and somatic dysfunction of cervical region: Secondary | ICD-10-CM | POA: Diagnosis not present

## 2017-12-10 DIAGNOSIS — M9902 Segmental and somatic dysfunction of thoracic region: Secondary | ICD-10-CM | POA: Diagnosis not present

## 2017-12-10 DIAGNOSIS — M47814 Spondylosis without myelopathy or radiculopathy, thoracic region: Secondary | ICD-10-CM | POA: Diagnosis not present

## 2017-12-10 DIAGNOSIS — M50323 Other cervical disc degeneration at C6-C7 level: Secondary | ICD-10-CM | POA: Diagnosis not present

## 2017-12-14 DIAGNOSIS — Z136 Encounter for screening for cardiovascular disorders: Secondary | ICD-10-CM | POA: Diagnosis not present

## 2017-12-14 DIAGNOSIS — I10 Essential (primary) hypertension: Secondary | ICD-10-CM | POA: Diagnosis not present

## 2017-12-14 DIAGNOSIS — R69 Illness, unspecified: Secondary | ICD-10-CM | POA: Diagnosis not present

## 2017-12-14 DIAGNOSIS — M069 Rheumatoid arthritis, unspecified: Secondary | ICD-10-CM | POA: Diagnosis not present

## 2017-12-14 DIAGNOSIS — Z0000 Encounter for general adult medical examination without abnormal findings: Secondary | ICD-10-CM | POA: Diagnosis not present

## 2017-12-14 DIAGNOSIS — G4733 Obstructive sleep apnea (adult) (pediatric): Secondary | ICD-10-CM | POA: Diagnosis not present

## 2017-12-14 DIAGNOSIS — R7303 Prediabetes: Secondary | ICD-10-CM | POA: Diagnosis not present

## 2017-12-14 DIAGNOSIS — E785 Hyperlipidemia, unspecified: Secondary | ICD-10-CM | POA: Diagnosis not present

## 2017-12-14 DIAGNOSIS — Z131 Encounter for screening for diabetes mellitus: Secondary | ICD-10-CM | POA: Diagnosis not present

## 2017-12-14 DIAGNOSIS — E559 Vitamin D deficiency, unspecified: Secondary | ICD-10-CM | POA: Diagnosis not present

## 2017-12-14 DIAGNOSIS — Z01118 Encounter for examination of ears and hearing with other abnormal findings: Secondary | ICD-10-CM | POA: Diagnosis not present

## 2017-12-24 DIAGNOSIS — M47814 Spondylosis without myelopathy or radiculopathy, thoracic region: Secondary | ICD-10-CM | POA: Diagnosis not present

## 2017-12-24 DIAGNOSIS — M50323 Other cervical disc degeneration at C6-C7 level: Secondary | ICD-10-CM | POA: Diagnosis not present

## 2017-12-24 DIAGNOSIS — M9901 Segmental and somatic dysfunction of cervical region: Secondary | ICD-10-CM | POA: Diagnosis not present

## 2017-12-24 DIAGNOSIS — M9902 Segmental and somatic dysfunction of thoracic region: Secondary | ICD-10-CM | POA: Diagnosis not present

## 2017-12-27 DIAGNOSIS — H16203 Unspecified keratoconjunctivitis, bilateral: Secondary | ICD-10-CM | POA: Diagnosis not present

## 2017-12-27 DIAGNOSIS — H01002 Unspecified blepharitis right lower eyelid: Secondary | ICD-10-CM | POA: Diagnosis not present

## 2017-12-27 DIAGNOSIS — Z961 Presence of intraocular lens: Secondary | ICD-10-CM | POA: Diagnosis not present

## 2017-12-27 DIAGNOSIS — H01001 Unspecified blepharitis right upper eyelid: Secondary | ICD-10-CM | POA: Diagnosis not present

## 2018-01-07 DIAGNOSIS — M9902 Segmental and somatic dysfunction of thoracic region: Secondary | ICD-10-CM | POA: Diagnosis not present

## 2018-01-07 DIAGNOSIS — M9901 Segmental and somatic dysfunction of cervical region: Secondary | ICD-10-CM | POA: Diagnosis not present

## 2018-01-07 DIAGNOSIS — M50323 Other cervical disc degeneration at C6-C7 level: Secondary | ICD-10-CM | POA: Diagnosis not present

## 2018-01-07 DIAGNOSIS — M47814 Spondylosis without myelopathy or radiculopathy, thoracic region: Secondary | ICD-10-CM | POA: Diagnosis not present

## 2018-01-25 DIAGNOSIS — G4733 Obstructive sleep apnea (adult) (pediatric): Secondary | ICD-10-CM | POA: Diagnosis not present

## 2018-01-25 DIAGNOSIS — Z0000 Encounter for general adult medical examination without abnormal findings: Secondary | ICD-10-CM | POA: Diagnosis not present

## 2018-01-25 DIAGNOSIS — E559 Vitamin D deficiency, unspecified: Secondary | ICD-10-CM | POA: Diagnosis not present

## 2018-01-25 DIAGNOSIS — E785 Hyperlipidemia, unspecified: Secondary | ICD-10-CM | POA: Diagnosis not present

## 2018-01-25 DIAGNOSIS — R69 Illness, unspecified: Secondary | ICD-10-CM | POA: Diagnosis not present

## 2018-01-25 DIAGNOSIS — R7303 Prediabetes: Secondary | ICD-10-CM | POA: Diagnosis not present

## 2018-01-25 DIAGNOSIS — M069 Rheumatoid arthritis, unspecified: Secondary | ICD-10-CM | POA: Diagnosis not present

## 2018-01-25 DIAGNOSIS — I10 Essential (primary) hypertension: Secondary | ICD-10-CM | POA: Diagnosis not present

## 2018-01-31 ENCOUNTER — Other Ambulatory Visit: Payer: Self-pay | Admitting: Internal Medicine

## 2018-01-31 DIAGNOSIS — L570 Actinic keratosis: Secondary | ICD-10-CM | POA: Diagnosis not present

## 2018-01-31 DIAGNOSIS — Z139 Encounter for screening, unspecified: Secondary | ICD-10-CM

## 2018-01-31 DIAGNOSIS — E2839 Other primary ovarian failure: Secondary | ICD-10-CM

## 2018-02-04 DIAGNOSIS — M9901 Segmental and somatic dysfunction of cervical region: Secondary | ICD-10-CM | POA: Diagnosis not present

## 2018-02-04 DIAGNOSIS — M50323 Other cervical disc degeneration at C6-C7 level: Secondary | ICD-10-CM | POA: Diagnosis not present

## 2018-02-04 DIAGNOSIS — M9902 Segmental and somatic dysfunction of thoracic region: Secondary | ICD-10-CM | POA: Diagnosis not present

## 2018-02-04 DIAGNOSIS — M47814 Spondylosis without myelopathy or radiculopathy, thoracic region: Secondary | ICD-10-CM | POA: Diagnosis not present

## 2018-02-18 DIAGNOSIS — M50323 Other cervical disc degeneration at C6-C7 level: Secondary | ICD-10-CM | POA: Diagnosis not present

## 2018-02-18 DIAGNOSIS — M47814 Spondylosis without myelopathy or radiculopathy, thoracic region: Secondary | ICD-10-CM | POA: Diagnosis not present

## 2018-02-18 DIAGNOSIS — M9901 Segmental and somatic dysfunction of cervical region: Secondary | ICD-10-CM | POA: Diagnosis not present

## 2018-02-18 DIAGNOSIS — M9902 Segmental and somatic dysfunction of thoracic region: Secondary | ICD-10-CM | POA: Diagnosis not present

## 2018-03-01 DIAGNOSIS — L570 Actinic keratosis: Secondary | ICD-10-CM | POA: Diagnosis not present

## 2018-03-10 ENCOUNTER — Encounter: Payer: Self-pay | Admitting: Radiology

## 2018-03-10 ENCOUNTER — Ambulatory Visit
Admission: RE | Admit: 2018-03-10 | Discharge: 2018-03-10 | Disposition: A | Discharge status: Home / Self Care | Payer: Medicare HMO | Source: Ambulatory Visit | Attending: Internal Medicine | Admitting: Internal Medicine

## 2018-03-10 DIAGNOSIS — Z139 Encounter for screening, unspecified: Secondary | ICD-10-CM

## 2018-03-10 DIAGNOSIS — M8588 Other specified disorders of bone density and structure, other site: Secondary | ICD-10-CM | POA: Diagnosis not present

## 2018-03-10 DIAGNOSIS — Z1231 Encounter for screening mammogram for malignant neoplasm of breast: Secondary | ICD-10-CM | POA: Diagnosis not present

## 2018-03-10 DIAGNOSIS — Z780 Asymptomatic menopausal state: Secondary | ICD-10-CM | POA: Diagnosis not present

## 2018-03-10 DIAGNOSIS — E2839 Other primary ovarian failure: Secondary | ICD-10-CM

## 2018-03-11 DIAGNOSIS — M50323 Other cervical disc degeneration at C6-C7 level: Secondary | ICD-10-CM | POA: Diagnosis not present

## 2018-03-11 DIAGNOSIS — M9902 Segmental and somatic dysfunction of thoracic region: Secondary | ICD-10-CM | POA: Diagnosis not present

## 2018-03-11 DIAGNOSIS — M9901 Segmental and somatic dysfunction of cervical region: Secondary | ICD-10-CM | POA: Diagnosis not present

## 2018-03-11 DIAGNOSIS — M47814 Spondylosis without myelopathy or radiculopathy, thoracic region: Secondary | ICD-10-CM | POA: Diagnosis not present

## 2018-03-14 ENCOUNTER — Other Ambulatory Visit: Payer: Self-pay

## 2018-03-14 ENCOUNTER — Emergency Department (HOSPITAL_COMMUNITY)
Admission: EM | Admit: 2018-03-14 | Discharge: 2018-03-14 | Disposition: A | Discharge status: Home / Self Care | Payer: Medicare HMO | Attending: Emergency Medicine | Admitting: Emergency Medicine

## 2018-03-14 ENCOUNTER — Encounter (HOSPITAL_COMMUNITY): Payer: Self-pay

## 2018-03-14 DIAGNOSIS — S61210A Laceration without foreign body of right index finger without damage to nail, initial encounter: Secondary | ICD-10-CM | POA: Diagnosis not present

## 2018-03-14 DIAGNOSIS — Y929 Unspecified place or not applicable: Secondary | ICD-10-CM | POA: Diagnosis not present

## 2018-03-14 DIAGNOSIS — Z79899 Other long term (current) drug therapy: Secondary | ICD-10-CM | POA: Diagnosis not present

## 2018-03-14 DIAGNOSIS — Y93G1 Activity, food preparation and clean up: Secondary | ICD-10-CM | POA: Diagnosis not present

## 2018-03-14 DIAGNOSIS — Y999 Unspecified external cause status: Secondary | ICD-10-CM | POA: Insufficient documentation

## 2018-03-14 DIAGNOSIS — W260XXA Contact with knife, initial encounter: Secondary | ICD-10-CM | POA: Insufficient documentation

## 2018-03-14 HISTORY — DX: Unspecified osteoarthritis, unspecified site: M19.90

## 2018-03-14 MED ORDER — TETANUS-DIPHTH-ACELL PERTUSSIS 5-2.5-18.5 LF-MCG/0.5 IM SUSP
0.5000 mL | Freq: Once | INTRAMUSCULAR | Status: AC
  Administered 2018-03-14: 0.5 mL via INTRAMUSCULAR
  Filled 2018-03-14: qty 0.5

## 2018-03-14 MED ORDER — LIDOCAINE HCL (PF) 1 % IJ SOLN
5.0000 mL | Freq: Once | INTRAMUSCULAR | Status: AC
  Administered 2018-03-14: 5 mL
  Filled 2018-03-14: qty 30

## 2018-03-14 NOTE — ED Notes (Signed)
RN used finger tourniquet on pt and then irrigated wound for PA to stitch

## 2018-03-14 NOTE — Discharge Instructions (Signed)
Follow-up with your PCP in 1 week for wound re-check and to get sutures removed.   Keep area covered for the next 24-48 hours.   You may clean with soap and water. You may use Neosporin or Bacitracin with gauze or band-aid after initial dressing is removed.

## 2018-03-14 NOTE — ED Triage Notes (Signed)
Patient states she cut her right pointer finger with a knife while making lunch.

## 2018-03-14 NOTE — ED Provider Notes (Signed)
[facility] DEPT Provider Note  CSN: [id number] Arrival date & time: 03/14/18  1239  History   Chief Complaint Chief Complaint  Patient presents with  . Finger Injury    HPI Ruth Sharp is a 67 y.o. female with a medical history of MVP and OSA who presented to the ED for right finger laceration. Patient presents approx. 45 min after cutting her right index finger with knife while making lunch. Denies paresthesias, skin color changes or loss of movement with injury. She is not on any anticoagulant.  Past Medical History:  Diagnosis Date  . Arthritis   . Tachycardia     Patient Active Problem List   Diagnosis Date Noted  . Mitral valve prolapse 04/20/2017  . Obstructive sleep apnea 04/20/2017  . Tachycardia     Past Surgical History:  Procedure Laterality Date  . ABDOMINAL HYSTERECTOMY    . BREAST EXCISIONAL BIOPSY Right    benign  . knee replacements       OB History   None      Home Medications    Prior to Admission medications   Medication Sig Start Date End Date Taking? Authorizing Provider  Calcium Carbonate-Vitamin D (CALTRATE 600+D PO) Take 1 tablet by mouth daily.   Yes [provider]  fluorouracil (EFUDEX) 5 % cream Apply 1 application topically daily. For 2 weeks 02/04/18  Yes [provider]  hydroxychloroquine (PLAQUENIL) 200 MG tablet Take 2 tablets (400 mg total) by mouth daily. 04/20/17  Yes Lorretta Harp, MD  metoprolol tartrate (LOPRESSOR) 50 MG tablet Take 1.5 tablets (75 mg total) by mouth 2 (two) times daily. 04/20/17  Yes Lorretta Harp, MD  Multiple Vitamins-Minerals (CENTRUM SILVER) CHEW Chew 1 each by mouth daily.   Yes [provider]  RESTASIS 0.05 % ophthalmic emulsion Place 1 drop into both eyes daily. 01/26/18  Yes [provider]  TURMERIC PO Take by mouth.   Yes [provider]    Family History Family History  Problem Relation Age of Onset  . Heart  failure Mother     Social History Social History   Tobacco Use  . Smoking status: Never Smoker  . Smokeless tobacco: Never Used  Substance Use Topics  . Alcohol use: Yes    Comment: occasionally  . Drug use: Never     Allergies   Keflex [cephalexin]   Review of Systems Review of Systems  Constitutional: Negative.   Skin: Positive for wound. Negative for color change and pallor.  Allergic/Immunologic: Negative for immunocompromised state.  Neurological: Negative for weakness and numbness.  Hematological: Negative.      Physical Exam Updated Vital Signs BP 128/80 (BP Location: Left Arm)   Pulse 62   Temp 97.7 F (36.5 C) (Oral)   Resp 16   Ht 5\' 8"  (1.727 m)   Wt 78.9 kg (174 lb)   SpO2 100%   BMI 26.46 kg/m   Physical Exam  Constitutional: She appears well-developed and well-nourished.  Musculoskeletal:       Right wrist: Normal.       Left wrist: Normal.       Right hand: She exhibits laceration. She exhibits normal range of motion, normal capillary refill, no deformity and no swelling. Normal sensation noted. Normal strength noted.       Left hand: Normal.       Hands: 2 cm laceration on medial aspect right index finger near middle phalanx. 5/5 grip strength, extension and  flexion in fingers, hands and wrists bilaterally.  Neurological: She has normal strength. No sensory deficit. She exhibits normal muscle tone.  Skin: Skin is warm. Capillary refill takes less than 2 seconds. Laceration noted.  Nursing note and vitals reviewed.    ED Treatments / Results  Labs (all labs ordered are listed, but only abnormal results are displayed) Labs Reviewed - No data to display  EKG None  Radiology No results found.  Procedures .Marland KitchenLaceration Repair Date/Time: 03/14/2018 2:03 PM Performed by: Romie Jumper, PA-C Authorized by: Romie Jumper, PA-C   Consent:    Consent obtained:  Verbal   Consent given by:  Patient   Risks discussed:   Infection, pain, poor cosmetic result, need for additional repair and poor wound healing   Alternatives discussed:  No treatment Anesthesia (see MAR for exact dosages):    Anesthesia method:  Nerve block   Block needle gauge:  25 G   Block anesthetic:  Lidocaine 1% w/o epi   Block injection procedure:  Anatomic landmarks identified, anatomic landmarks palpated and introduced needle   Block outcome:  Anesthesia achieved Laceration details:    Location:  Finger   Finger location:  R index finger   Length (cm):  3   Depth (mm):  2 Repair type:    Repair type:  Simple Pre-procedure details:    Preparation:  Patient was prepped and draped in usual sterile fashion Exploration:    Hemostasis achieved with:  Tourniquet and direct pressure   Wound exploration: wound explored through full range of motion and entire depth of wound probed and visualized     Wound extent: no fascia violation noted, no foreign bodies/material noted, no muscle damage noted, no tendon damage noted and no vascular damage noted     Contaminated: no   Treatment:    Area cleansed with:  Saline and Betadine   Amount of cleaning:  Extensive   Irrigation solution:  Sterile saline   Irrigation volume:  20cc   Irrigation method:  Syringe Skin repair:    Repair method:  Sutures   Suture size:  4-0   Suture material:  Prolene   Number of sutures:  2 Approximation:    Approximation:  Loose Post-procedure details:    Dressing:  Sterile dressing   Patient tolerance of procedure:  Tolerated well, no immediate complications Comments:     Dermabond applied to proximal end as it was too superficial for sutures.   (including critical care time)  Medications Ordered in ED Medications  lidocaine (PF) (XYLOCAINE) 1 % injection 5 mL (5 mLs Infiltration Given by Other 03/14/18 1330)  Tdap (BOOSTRIX) injection 0.5 mL (0.5 mLs Intramuscular Given 03/14/18 1329)     Initial Impression / Assessment and Plan / ED Course  Triage  vital signs and the nursing notes have been reviewed.  Pertinent labs & imaging results that were available during care of the patient were reviewed and considered in medical decision making (see chart for details).   Patient presents approx. 45 minutes after laceration to right index finger. Physical exam is reassuring that there has been no tendon, arterial or nerve damage. Laceration repair was completed without complications.  Education provided on cleaning, s/s of infection and treatments for pain.   Final Clinical Impressions(s) / ED Diagnoses   Dispo: Home. After thorough clinical evaluation, this patient is determined to be medically stable and can be safely discharged with the previously mentioned treatment and/or outpatient follow-up/referral(s). At this time, there are  no other apparent medical conditions that require further screening, evaluation or treatment.   Final diagnoses:  Laceration of right index finger without foreign body without damage to nail, initial encounter    ED Discharge Orders    None        Junita Push 03/14/18 1406    Dorie Rank, MD 03/15/18 [phone]

## 2018-03-25 DIAGNOSIS — M50323 Other cervical disc degeneration at C6-C7 level: Secondary | ICD-10-CM | POA: Diagnosis not present

## 2018-03-25 DIAGNOSIS — M9901 Segmental and somatic dysfunction of cervical region: Secondary | ICD-10-CM | POA: Diagnosis not present

## 2018-03-25 DIAGNOSIS — M9902 Segmental and somatic dysfunction of thoracic region: Secondary | ICD-10-CM | POA: Diagnosis not present

## 2018-03-25 DIAGNOSIS — M47814 Spondylosis without myelopathy or radiculopathy, thoracic region: Secondary | ICD-10-CM | POA: Diagnosis not present

## 2018-04-14 ENCOUNTER — Other Ambulatory Visit: Payer: Self-pay | Admitting: Physician Assistant

## 2018-04-14 ENCOUNTER — Other Ambulatory Visit: Payer: Self-pay | Admitting: Cardiovascular Disease

## 2018-04-14 DIAGNOSIS — I341 Nonrheumatic mitral (valve) prolapse: Secondary | ICD-10-CM

## 2018-04-14 DIAGNOSIS — R002 Palpitations: Secondary | ICD-10-CM

## 2018-04-14 DIAGNOSIS — G4733 Obstructive sleep apnea (adult) (pediatric): Secondary | ICD-10-CM

## 2018-04-14 NOTE — Telephone Encounter (Signed)
This is Dr. Berry's pt 

## 2018-04-14 NOTE — Telephone Encounter (Signed)
Rx sent to pharmacy   

## 2018-04-15 DIAGNOSIS — M47814 Spondylosis without myelopathy or radiculopathy, thoracic region: Secondary | ICD-10-CM | POA: Diagnosis not present

## 2018-04-15 DIAGNOSIS — M50323 Other cervical disc degeneration at C6-C7 level: Secondary | ICD-10-CM | POA: Diagnosis not present

## 2018-04-15 DIAGNOSIS — M9901 Segmental and somatic dysfunction of cervical region: Secondary | ICD-10-CM | POA: Diagnosis not present

## 2018-04-15 DIAGNOSIS — M9902 Segmental and somatic dysfunction of thoracic region: Secondary | ICD-10-CM | POA: Diagnosis not present

## 2018-04-16 DIAGNOSIS — Z8249 Family history of ischemic heart disease and other diseases of the circulatory system: Secondary | ICD-10-CM | POA: Diagnosis not present

## 2018-04-16 DIAGNOSIS — Z8542 Personal history of malignant neoplasm of other parts of uterus: Secondary | ICD-10-CM | POA: Diagnosis not present

## 2018-04-16 DIAGNOSIS — Z825 Family history of asthma and other chronic lower respiratory diseases: Secondary | ICD-10-CM | POA: Diagnosis not present

## 2018-04-16 DIAGNOSIS — Z809 Family history of malignant neoplasm, unspecified: Secondary | ICD-10-CM | POA: Diagnosis not present

## 2018-04-16 DIAGNOSIS — R32 Unspecified urinary incontinence: Secondary | ICD-10-CM | POA: Diagnosis not present

## 2018-04-16 DIAGNOSIS — R000 Tachycardia, unspecified: Secondary | ICD-10-CM | POA: Diagnosis not present

## 2018-04-16 DIAGNOSIS — H04129 Dry eye syndrome of unspecified lacrimal gland: Secondary | ICD-10-CM | POA: Diagnosis not present

## 2018-04-16 DIAGNOSIS — M069 Rheumatoid arthritis, unspecified: Secondary | ICD-10-CM | POA: Diagnosis not present

## 2018-04-16 DIAGNOSIS — J302 Other seasonal allergic rhinitis: Secondary | ICD-10-CM | POA: Diagnosis not present

## 2018-04-16 DIAGNOSIS — Z85828 Personal history of other malignant neoplasm of skin: Secondary | ICD-10-CM | POA: Diagnosis not present

## 2018-04-20 ENCOUNTER — Other Ambulatory Visit: Payer: Self-pay | Admitting: *Deleted

## 2018-04-20 ENCOUNTER — Encounter: Payer: Self-pay | Admitting: Cardiovascular Disease

## 2018-04-20 ENCOUNTER — Ambulatory Visit: Payer: Medicare HMO | Admitting: Cardiovascular Disease

## 2018-04-20 VITALS — BP 124/68 | HR 62 | Ht 66.0 in | Wt 168.8 lb

## 2018-04-20 DIAGNOSIS — G4733 Obstructive sleep apnea (adult) (pediatric): Secondary | ICD-10-CM

## 2018-04-20 DIAGNOSIS — E7800 Pure hypercholesterolemia, unspecified: Secondary | ICD-10-CM

## 2018-04-20 DIAGNOSIS — R000 Tachycardia, unspecified: Secondary | ICD-10-CM

## 2018-04-20 DIAGNOSIS — R002 Palpitations: Secondary | ICD-10-CM

## 2018-04-20 DIAGNOSIS — I341 Nonrheumatic mitral (valve) prolapse: Secondary | ICD-10-CM

## 2018-04-20 MED ORDER — METOPROLOL TARTRATE 50 MG PO TABS: ORAL_TABLET | ORAL | 3 refills | Status: DC

## 2018-04-20 NOTE — Assessment & Plan Note (Signed)
History of obstructive sleep apnea not on CPAP. 

## 2018-04-20 NOTE — Patient Instructions (Signed)
Medication Instructions:   NO CHANGE  Labwork:  Your physician recommends that you return for lab work PRIOR TO EATING  Follow-Up:  Your physician wants you to follow-up in: ONE YEAR WITH DR BERRY You will receive a reminder letter in the mail two months in advance. If you don't receive a letter, please call our office to schedule the follow-up appointment.   If you need a refill on your cardiac medications before your next appointment, please call your pharmacy.    

## 2018-04-20 NOTE — Assessment & Plan Note (Signed)
Prior history of mitral valve prolapse although 2D echocardiogram performed 05/04/2017 did not confirm this.  She had only mild MR.

## 2018-04-20 NOTE — Progress Notes (Signed)
04/20/2018 Ruth Sharp   Mar 27, 1951  [id number]  Primary Physician Benito Mccreedy, MD Primary Cardiologist: Lorretta Harp MD FACP, [facility], [place], [place]  HPI:  Ruth Sharp is a 66 y.o.  separated, mother of one child with no grandchildren who worked in a gym in the past and relocated from [place] down to [place] approximately 3 months ago to be closer to family.  Her sister is Rosaria Ferries, PA-C.  She had been seeing cardiology group in [place]. She has a history of palpitations thought to be related to PACs on event monitor on low-dose beta blocker. She has mild mitral valve prolapse with mild to moderate MR by 2-D echo and a negative stress test in the past. She does have symptoms of obstructive sleep apnea as well.  Since I saw her a year ago she has lost 25 pounds as result of intentional dieting and feels clinically improved.  Her palpitations have essentially resolved.  She denies chest pain or shortness of breath.  A 2D echo performed 05/04/2017 was essentially normal without evidence of mitral valve prolapse with only mild MR.      Current Meds  Medication Sig  . Calcium Carbonate-Vitamin D (CALTRATE 600+D PO) Take 1 tablet by mouth daily.  . hydroxychloroquine (PLAQUENIL) 200 MG tablet Take 2 tablets (400 mg total) by mouth daily.  . metoprolol tartrate (LOPRESSOR) 50 MG tablet TAKE 1 AND 1/2 TABLETS BY MOUTH TWICE DAILY  . Multiple Vitamins-Minerals (CENTRUM SILVER) CHEW Chew 1 each by mouth daily.  . RESTASIS 0.05 % ophthalmic emulsion Place 1 drop into both eyes 2 (two) times daily.   . TURMERIC PO Take by mouth.     Allergies  Allergen Reactions  . Keflex [Cephalexin] Rash    Social History   Socioeconomic History  . Marital status: Legally Separated    Spouse name: Not on file  . Number of children: Not on file  . Years of education: Not on file  . Highest education level: Not on file  Occupational History  . Not on file  Social  Needs  . Financial resource strain: Not on file  . Food insecurity:    Worry: Not on file    Inability: Not on file  . Transportation needs:    Medical: Not on file    Non-medical: Not on file  Tobacco Use  . Smoking status: Never Smoker  . Smokeless tobacco: Never Used  Substance and Sexual Activity  . Alcohol use: Yes    Comment: occasionally  . Drug use: Never  . Sexual activity: Not on file  Lifestyle  . Physical activity:    Days per week: Not on file    Minutes per session: Not on file  . Stress: Not on file  Relationships  . Social connections:    Talks on phone: Not on file    Gets together: Not on file    Attends religious service: Not on file    Active member of club or organization: Not on file    Attends meetings of clubs or organizations: Not on file    Relationship status: Not on file  . Intimate partner violence:    Fear of current or ex partner: Not on file    Emotionally abused: Not on file    Physically abused: Not on file    Forced sexual activity: Not on file  Other Topics Concern  . Not on file  Social History Narrative  . Not  on file     Review of Systems: General: negative for chills, fever, night sweats or weight changes.  Cardiovascular: negative for chest pain, dyspnea on exertion, edema, orthopnea, palpitations, paroxysmal nocturnal dyspnea or shortness of breath Dermatological: negative for rash Respiratory: negative for cough or wheezing Urologic: negative for hematuria Abdominal: negative for nausea, vomiting, diarrhea, bright red blood per rectum, melena, or hematemesis Neurologic: negative for visual changes, syncope, or dizziness All other systems reviewed and are otherwise negative except as noted above.    Blood pressure 124/68, pulse 62, height 5\' 6"  (1.676 m), weight 168 lb 12.8 oz (76.6 kg).  General appearance: alert and no distress Neck: no adenopathy, no carotid bruit, no JVD, supple, symmetrical, trachea midline and  thyroid not enlarged, symmetric, no tenderness/mass/nodules Lungs: clear to auscultation bilaterally Heart: regular rate and rhythm, S1, S2 normal, no murmur, click, rub or gallop Extremities: extremities normal, atraumatic, no cyanosis or edema Pulses: 2+ and symmetric Skin: Skin color, texture, turgor normal. No rashes or lesions Neurologic: Alert and oriented X 3, normal strength and tone. Normal symmetric reflexes. Normal coordination and gait  EKG sinus rhythm at 62 without ST or T wave changes.  I personally reviewed this EKG.  ASSESSMENT AND PLAN:   Mitral valve prolapse Prior history of mitral valve prolapse although 2D echocardiogram performed 05/04/2017 did not confirm this.  She had only mild MR.  Obstructive sleep apnea History of obstructive sleep apnea not on CPAP      Lorretta Harp MD [facility], [facility] 04/20/2018 2:40 PM

## 2018-04-25 DIAGNOSIS — E7800 Pure hypercholesterolemia, unspecified: Secondary | ICD-10-CM | POA: Diagnosis not present

## 2018-04-26 LAB — LIPID PANEL
Chol/HDL Ratio: 2.6 ratio (ref 0.0–4.4)
Cholesterol, Total: 189 mg/dL (ref 100–199)
HDL: 72 mg/dL (ref >39)
LDL Calculated: 103 mg/dL — ABNORMAL HIGH (ref 0–99)
Triglycerides: 71 mg/dL (ref 0–149)
VLDL Cholesterol Cal: 14 mg/dL (ref 5–40)

## 2018-04-26 LAB — HEPATIC FUNCTION PANEL
ALT: 43 IU/L — ABNORMAL HIGH (ref 0–32)
AST: 63 IU/L — ABNORMAL HIGH (ref 0–40)
Albumin: 4.5 g/dL (ref 3.6–4.8)
Alkaline Phosphatase: 68 IU/L (ref 39–117)
Bilirubin Total: 0.5 mg/dL (ref 0.0–1.2)
Bilirubin, Direct: 0.15 mg/dL (ref 0.00–0.40)
Total Protein: 6.6 g/dL (ref 6.0–8.5)

## 2018-04-27 ENCOUNTER — Encounter: Payer: Self-pay | Admitting: *Deleted

## 2018-04-29 DIAGNOSIS — M9902 Segmental and somatic dysfunction of thoracic region: Secondary | ICD-10-CM | POA: Diagnosis not present

## 2018-04-29 DIAGNOSIS — M47814 Spondylosis without myelopathy or radiculopathy, thoracic region: Secondary | ICD-10-CM | POA: Diagnosis not present

## 2018-04-29 DIAGNOSIS — M50323 Other cervical disc degeneration at C6-C7 level: Secondary | ICD-10-CM | POA: Diagnosis not present

## 2018-04-29 DIAGNOSIS — M9901 Segmental and somatic dysfunction of cervical region: Secondary | ICD-10-CM | POA: Diagnosis not present

## 2018-05-03 DIAGNOSIS — R7303 Prediabetes: Secondary | ICD-10-CM | POA: Diagnosis not present

## 2018-05-03 DIAGNOSIS — E7800 Pure hypercholesterolemia, unspecified: Secondary | ICD-10-CM | POA: Diagnosis not present

## 2018-05-03 DIAGNOSIS — M069 Rheumatoid arthritis, unspecified: Secondary | ICD-10-CM | POA: Diagnosis not present

## 2018-05-03 DIAGNOSIS — E785 Hyperlipidemia, unspecified: Secondary | ICD-10-CM | POA: Diagnosis not present

## 2018-05-03 DIAGNOSIS — I10 Essential (primary) hypertension: Secondary | ICD-10-CM | POA: Diagnosis not present

## 2018-05-10 DIAGNOSIS — R7303 Prediabetes: Secondary | ICD-10-CM | POA: Diagnosis not present

## 2018-05-10 DIAGNOSIS — M069 Rheumatoid arthritis, unspecified: Secondary | ICD-10-CM | POA: Diagnosis not present

## 2018-05-10 DIAGNOSIS — I10 Essential (primary) hypertension: Secondary | ICD-10-CM | POA: Diagnosis not present

## 2018-05-10 DIAGNOSIS — E785 Hyperlipidemia, unspecified: Secondary | ICD-10-CM | POA: Diagnosis not present

## 2018-05-14 ENCOUNTER — Other Ambulatory Visit: Payer: Self-pay | Admitting: Cardiovascular Disease

## 2018-05-14 DIAGNOSIS — I341 Nonrheumatic mitral (valve) prolapse: Secondary | ICD-10-CM

## 2018-05-14 DIAGNOSIS — R002 Palpitations: Secondary | ICD-10-CM

## 2018-05-14 DIAGNOSIS — G4733 Obstructive sleep apnea (adult) (pediatric): Secondary | ICD-10-CM

## 2018-05-25 DIAGNOSIS — M25471 Effusion, right ankle: Secondary | ICD-10-CM | POA: Diagnosis not present

## 2018-05-25 DIAGNOSIS — R5383 Other fatigue: Secondary | ICD-10-CM | POA: Diagnosis not present

## 2018-05-25 DIAGNOSIS — Z6826 Body mass index (BMI) 26.0-26.9, adult: Secondary | ICD-10-CM | POA: Diagnosis not present

## 2018-05-25 DIAGNOSIS — M2550 Pain in unspecified joint: Secondary | ICD-10-CM | POA: Diagnosis not present

## 2018-05-25 DIAGNOSIS — E663 Overweight: Secondary | ICD-10-CM | POA: Diagnosis not present

## 2018-05-25 DIAGNOSIS — M0579 Rheumatoid arthritis with rheumatoid factor of multiple sites without organ or systems involvement: Secondary | ICD-10-CM | POA: Diagnosis not present

## 2018-05-25 DIAGNOSIS — M150 Primary generalized (osteo)arthritis: Secondary | ICD-10-CM | POA: Diagnosis not present

## 2018-05-31 DIAGNOSIS — M50323 Other cervical disc degeneration at C6-C7 level: Secondary | ICD-10-CM | POA: Diagnosis not present

## 2018-05-31 DIAGNOSIS — M9902 Segmental and somatic dysfunction of thoracic region: Secondary | ICD-10-CM | POA: Diagnosis not present

## 2018-05-31 DIAGNOSIS — M47814 Spondylosis without myelopathy or radiculopathy, thoracic region: Secondary | ICD-10-CM | POA: Diagnosis not present

## 2018-05-31 DIAGNOSIS — M9901 Segmental and somatic dysfunction of cervical region: Secondary | ICD-10-CM | POA: Diagnosis not present

## 2018-06-17 DIAGNOSIS — M47814 Spondylosis without myelopathy or radiculopathy, thoracic region: Secondary | ICD-10-CM | POA: Diagnosis not present

## 2018-06-17 DIAGNOSIS — M9901 Segmental and somatic dysfunction of cervical region: Secondary | ICD-10-CM | POA: Diagnosis not present

## 2018-06-17 DIAGNOSIS — M50323 Other cervical disc degeneration at C6-C7 level: Secondary | ICD-10-CM | POA: Diagnosis not present

## 2018-06-17 DIAGNOSIS — M9902 Segmental and somatic dysfunction of thoracic region: Secondary | ICD-10-CM | POA: Diagnosis not present

## 2018-07-01 DIAGNOSIS — M9902 Segmental and somatic dysfunction of thoracic region: Secondary | ICD-10-CM | POA: Diagnosis not present

## 2018-07-01 DIAGNOSIS — M9901 Segmental and somatic dysfunction of cervical region: Secondary | ICD-10-CM | POA: Diagnosis not present

## 2018-07-01 DIAGNOSIS — M47814 Spondylosis without myelopathy or radiculopathy, thoracic region: Secondary | ICD-10-CM | POA: Diagnosis not present

## 2018-07-01 DIAGNOSIS — M50323 Other cervical disc degeneration at C6-C7 level: Secondary | ICD-10-CM | POA: Diagnosis not present

## 2018-07-11 DIAGNOSIS — Z1211 Encounter for screening for malignant neoplasm of colon: Secondary | ICD-10-CM | POA: Diagnosis not present

## 2018-07-11 DIAGNOSIS — R000 Tachycardia, unspecified: Secondary | ICD-10-CM | POA: Diagnosis not present

## 2018-07-11 DIAGNOSIS — Z6825 Body mass index (BMI) 25.0-25.9, adult: Secondary | ICD-10-CM | POA: Diagnosis not present

## 2018-07-11 DIAGNOSIS — Z8542 Personal history of malignant neoplasm of other parts of uterus: Secondary | ICD-10-CM | POA: Diagnosis not present

## 2018-07-11 DIAGNOSIS — Z23 Encounter for immunization: Secondary | ICD-10-CM | POA: Diagnosis not present

## 2018-07-11 DIAGNOSIS — H6123 Impacted cerumen, bilateral: Secondary | ICD-10-CM | POA: Diagnosis not present

## 2018-07-11 DIAGNOSIS — M069 Rheumatoid arthritis, unspecified: Secondary | ICD-10-CM | POA: Diagnosis not present

## 2018-07-26 DIAGNOSIS — M9902 Segmental and somatic dysfunction of thoracic region: Secondary | ICD-10-CM | POA: Diagnosis not present

## 2018-07-26 DIAGNOSIS — M0579 Rheumatoid arthritis with rheumatoid factor of multiple sites without organ or systems involvement: Secondary | ICD-10-CM | POA: Diagnosis not present

## 2018-07-26 DIAGNOSIS — Z6827 Body mass index (BMI) 27.0-27.9, adult: Secondary | ICD-10-CM | POA: Diagnosis not present

## 2018-07-26 DIAGNOSIS — E663 Overweight: Secondary | ICD-10-CM | POA: Diagnosis not present

## 2018-07-26 DIAGNOSIS — M50323 Other cervical disc degeneration at C6-C7 level: Secondary | ICD-10-CM | POA: Diagnosis not present

## 2018-07-26 DIAGNOSIS — M47814 Spondylosis without myelopathy or radiculopathy, thoracic region: Secondary | ICD-10-CM | POA: Diagnosis not present

## 2018-07-26 DIAGNOSIS — M150 Primary generalized (osteo)arthritis: Secondary | ICD-10-CM | POA: Diagnosis not present

## 2018-07-26 DIAGNOSIS — R5383 Other fatigue: Secondary | ICD-10-CM | POA: Diagnosis not present

## 2018-07-26 DIAGNOSIS — M25471 Effusion, right ankle: Secondary | ICD-10-CM | POA: Diagnosis not present

## 2018-07-26 DIAGNOSIS — M2550 Pain in unspecified joint: Secondary | ICD-10-CM | POA: Diagnosis not present

## 2018-07-26 DIAGNOSIS — M9901 Segmental and somatic dysfunction of cervical region: Secondary | ICD-10-CM | POA: Diagnosis not present

## 2018-08-09 DIAGNOSIS — M9901 Segmental and somatic dysfunction of cervical region: Secondary | ICD-10-CM | POA: Diagnosis not present

## 2018-08-09 DIAGNOSIS — M9902 Segmental and somatic dysfunction of thoracic region: Secondary | ICD-10-CM | POA: Diagnosis not present

## 2018-08-09 DIAGNOSIS — M47814 Spondylosis without myelopathy or radiculopathy, thoracic region: Secondary | ICD-10-CM | POA: Diagnosis not present

## 2018-08-09 DIAGNOSIS — M50323 Other cervical disc degeneration at C6-C7 level: Secondary | ICD-10-CM | POA: Diagnosis not present

## 2018-08-18 DIAGNOSIS — L821 Other seborrheic keratosis: Secondary | ICD-10-CM | POA: Diagnosis not present

## 2018-08-18 DIAGNOSIS — D485 Neoplasm of uncertain behavior of skin: Secondary | ICD-10-CM | POA: Diagnosis not present

## 2018-08-18 DIAGNOSIS — D1801 Hemangioma of skin and subcutaneous tissue: Secondary | ICD-10-CM | POA: Diagnosis not present

## 2018-08-18 DIAGNOSIS — Z23 Encounter for immunization: Secondary | ICD-10-CM | POA: Diagnosis not present

## 2018-08-18 DIAGNOSIS — Z872 Personal history of diseases of the skin and subcutaneous tissue: Secondary | ICD-10-CM | POA: Diagnosis not present

## 2018-08-18 DIAGNOSIS — D2262 Melanocytic nevi of left upper limb, including shoulder: Secondary | ICD-10-CM | POA: Diagnosis not present

## 2018-08-18 DIAGNOSIS — D2272 Melanocytic nevi of left lower limb, including hip: Secondary | ICD-10-CM | POA: Diagnosis not present

## 2018-08-18 DIAGNOSIS — D225 Melanocytic nevi of trunk: Secondary | ICD-10-CM | POA: Diagnosis not present

## 2018-08-18 DIAGNOSIS — L814 Other melanin hyperpigmentation: Secondary | ICD-10-CM | POA: Diagnosis not present

## 2018-08-23 DIAGNOSIS — M9902 Segmental and somatic dysfunction of thoracic region: Secondary | ICD-10-CM | POA: Diagnosis not present

## 2018-08-23 DIAGNOSIS — M47814 Spondylosis without myelopathy or radiculopathy, thoracic region: Secondary | ICD-10-CM | POA: Diagnosis not present

## 2018-08-23 DIAGNOSIS — M9901 Segmental and somatic dysfunction of cervical region: Secondary | ICD-10-CM | POA: Diagnosis not present

## 2018-08-23 DIAGNOSIS — M50323 Other cervical disc degeneration at C6-C7 level: Secondary | ICD-10-CM | POA: Diagnosis not present

## 2018-09-06 DIAGNOSIS — M9901 Segmental and somatic dysfunction of cervical region: Secondary | ICD-10-CM | POA: Diagnosis not present

## 2018-09-06 DIAGNOSIS — M50323 Other cervical disc degeneration at C6-C7 level: Secondary | ICD-10-CM | POA: Diagnosis not present

## 2018-09-06 DIAGNOSIS — M9902 Segmental and somatic dysfunction of thoracic region: Secondary | ICD-10-CM | POA: Diagnosis not present

## 2018-09-06 DIAGNOSIS — M47814 Spondylosis without myelopathy or radiculopathy, thoracic region: Secondary | ICD-10-CM | POA: Diagnosis not present

## 2018-09-20 DIAGNOSIS — M9901 Segmental and somatic dysfunction of cervical region: Secondary | ICD-10-CM | POA: Diagnosis not present

## 2018-09-20 DIAGNOSIS — M50323 Other cervical disc degeneration at C6-C7 level: Secondary | ICD-10-CM | POA: Diagnosis not present

## 2018-09-20 DIAGNOSIS — M47814 Spondylosis without myelopathy or radiculopathy, thoracic region: Secondary | ICD-10-CM | POA: Diagnosis not present

## 2018-09-20 DIAGNOSIS — M9902 Segmental and somatic dysfunction of thoracic region: Secondary | ICD-10-CM | POA: Diagnosis not present

## 2018-10-04 DIAGNOSIS — M9901 Segmental and somatic dysfunction of cervical region: Secondary | ICD-10-CM | POA: Diagnosis not present

## 2018-10-04 DIAGNOSIS — M50323 Other cervical disc degeneration at C6-C7 level: Secondary | ICD-10-CM | POA: Diagnosis not present

## 2018-10-04 DIAGNOSIS — M47814 Spondylosis without myelopathy or radiculopathy, thoracic region: Secondary | ICD-10-CM | POA: Diagnosis not present

## 2018-10-04 DIAGNOSIS — M9902 Segmental and somatic dysfunction of thoracic region: Secondary | ICD-10-CM | POA: Diagnosis not present

## 2018-10-31 DIAGNOSIS — M9905 Segmental and somatic dysfunction of pelvic region: Secondary | ICD-10-CM | POA: Diagnosis not present

## 2018-10-31 DIAGNOSIS — M9902 Segmental and somatic dysfunction of thoracic region: Secondary | ICD-10-CM | POA: Diagnosis not present

## 2018-10-31 DIAGNOSIS — M9901 Segmental and somatic dysfunction of cervical region: Secondary | ICD-10-CM | POA: Diagnosis not present

## 2018-10-31 DIAGNOSIS — M9903 Segmental and somatic dysfunction of lumbar region: Secondary | ICD-10-CM | POA: Diagnosis not present

## 2018-11-16 DIAGNOSIS — M069 Rheumatoid arthritis, unspecified: Secondary | ICD-10-CM | POA: Diagnosis not present

## 2018-11-16 DIAGNOSIS — E663 Overweight: Secondary | ICD-10-CM | POA: Diagnosis not present

## 2018-11-16 DIAGNOSIS — Z6827 Body mass index (BMI) 27.0-27.9, adult: Secondary | ICD-10-CM | POA: Diagnosis not present

## 2018-11-16 DIAGNOSIS — Z1211 Encounter for screening for malignant neoplasm of colon: Secondary | ICD-10-CM | POA: Diagnosis not present

## 2018-11-16 DIAGNOSIS — I491 Atrial premature depolarization: Secondary | ICD-10-CM | POA: Diagnosis not present

## 2018-11-17 DIAGNOSIS — M9902 Segmental and somatic dysfunction of thoracic region: Secondary | ICD-10-CM | POA: Diagnosis not present

## 2018-11-17 DIAGNOSIS — M9903 Segmental and somatic dysfunction of lumbar region: Secondary | ICD-10-CM | POA: Diagnosis not present

## 2018-11-17 DIAGNOSIS — M9905 Segmental and somatic dysfunction of pelvic region: Secondary | ICD-10-CM | POA: Diagnosis not present

## 2018-11-17 DIAGNOSIS — M9901 Segmental and somatic dysfunction of cervical region: Secondary | ICD-10-CM | POA: Diagnosis not present

## 2018-11-24 DIAGNOSIS — M25471 Effusion, right ankle: Secondary | ICD-10-CM | POA: Diagnosis not present

## 2018-11-24 DIAGNOSIS — E663 Overweight: Secondary | ICD-10-CM | POA: Diagnosis not present

## 2018-11-24 DIAGNOSIS — R5383 Other fatigue: Secondary | ICD-10-CM | POA: Diagnosis not present

## 2018-11-24 DIAGNOSIS — M0579 Rheumatoid arthritis with rheumatoid factor of multiple sites without organ or systems involvement: Secondary | ICD-10-CM | POA: Diagnosis not present

## 2018-11-24 DIAGNOSIS — M150 Primary generalized (osteo)arthritis: Secondary | ICD-10-CM | POA: Diagnosis not present

## 2018-11-24 DIAGNOSIS — M2550 Pain in unspecified joint: Secondary | ICD-10-CM | POA: Diagnosis not present

## 2018-11-24 DIAGNOSIS — Z6828 Body mass index (BMI) 28.0-28.9, adult: Secondary | ICD-10-CM | POA: Diagnosis not present

## 2018-11-30 DIAGNOSIS — M9903 Segmental and somatic dysfunction of lumbar region: Secondary | ICD-10-CM | POA: Diagnosis not present

## 2018-11-30 DIAGNOSIS — M9901 Segmental and somatic dysfunction of cervical region: Secondary | ICD-10-CM | POA: Diagnosis not present

## 2018-11-30 DIAGNOSIS — M9905 Segmental and somatic dysfunction of pelvic region: Secondary | ICD-10-CM | POA: Diagnosis not present

## 2018-11-30 DIAGNOSIS — M9902 Segmental and somatic dysfunction of thoracic region: Secondary | ICD-10-CM | POA: Diagnosis not present

## 2018-12-13 DIAGNOSIS — M9902 Segmental and somatic dysfunction of thoracic region: Secondary | ICD-10-CM | POA: Diagnosis not present

## 2018-12-13 DIAGNOSIS — M9901 Segmental and somatic dysfunction of cervical region: Secondary | ICD-10-CM | POA: Diagnosis not present

## 2018-12-13 DIAGNOSIS — M9903 Segmental and somatic dysfunction of lumbar region: Secondary | ICD-10-CM | POA: Diagnosis not present

## 2018-12-13 DIAGNOSIS — M9905 Segmental and somatic dysfunction of pelvic region: Secondary | ICD-10-CM | POA: Diagnosis not present

## 2018-12-27 DIAGNOSIS — M9901 Segmental and somatic dysfunction of cervical region: Secondary | ICD-10-CM | POA: Diagnosis not present

## 2018-12-27 DIAGNOSIS — M9903 Segmental and somatic dysfunction of lumbar region: Secondary | ICD-10-CM | POA: Diagnosis not present

## 2018-12-27 DIAGNOSIS — M9905 Segmental and somatic dysfunction of pelvic region: Secondary | ICD-10-CM | POA: Diagnosis not present

## 2018-12-27 DIAGNOSIS — M9902 Segmental and somatic dysfunction of thoracic region: Secondary | ICD-10-CM | POA: Diagnosis not present

## 2019-01-10 DIAGNOSIS — M9905 Segmental and somatic dysfunction of pelvic region: Secondary | ICD-10-CM | POA: Diagnosis not present

## 2019-01-10 DIAGNOSIS — M9903 Segmental and somatic dysfunction of lumbar region: Secondary | ICD-10-CM | POA: Diagnosis not present

## 2019-01-10 DIAGNOSIS — M9901 Segmental and somatic dysfunction of cervical region: Secondary | ICD-10-CM | POA: Diagnosis not present

## 2019-01-10 DIAGNOSIS — M9902 Segmental and somatic dysfunction of thoracic region: Secondary | ICD-10-CM | POA: Diagnosis not present

## 2019-01-26 DIAGNOSIS — M9905 Segmental and somatic dysfunction of pelvic region: Secondary | ICD-10-CM | POA: Diagnosis not present

## 2019-01-26 DIAGNOSIS — M9901 Segmental and somatic dysfunction of cervical region: Secondary | ICD-10-CM | POA: Diagnosis not present

## 2019-01-26 DIAGNOSIS — M9903 Segmental and somatic dysfunction of lumbar region: Secondary | ICD-10-CM | POA: Diagnosis not present

## 2019-01-26 DIAGNOSIS — M9902 Segmental and somatic dysfunction of thoracic region: Secondary | ICD-10-CM | POA: Diagnosis not present

## 2019-02-09 DIAGNOSIS — M9903 Segmental and somatic dysfunction of lumbar region: Secondary | ICD-10-CM | POA: Diagnosis not present

## 2019-02-09 DIAGNOSIS — M9902 Segmental and somatic dysfunction of thoracic region: Secondary | ICD-10-CM | POA: Diagnosis not present

## 2019-02-09 DIAGNOSIS — M9905 Segmental and somatic dysfunction of pelvic region: Secondary | ICD-10-CM | POA: Diagnosis not present

## 2019-02-09 DIAGNOSIS — M9901 Segmental and somatic dysfunction of cervical region: Secondary | ICD-10-CM | POA: Diagnosis not present

## 2019-02-23 DIAGNOSIS — M9903 Segmental and somatic dysfunction of lumbar region: Secondary | ICD-10-CM | POA: Diagnosis not present

## 2019-02-23 DIAGNOSIS — M9902 Segmental and somatic dysfunction of thoracic region: Secondary | ICD-10-CM | POA: Diagnosis not present

## 2019-02-23 DIAGNOSIS — M9905 Segmental and somatic dysfunction of pelvic region: Secondary | ICD-10-CM | POA: Diagnosis not present

## 2019-02-23 DIAGNOSIS — M9901 Segmental and somatic dysfunction of cervical region: Secondary | ICD-10-CM | POA: Diagnosis not present

## 2019-03-02 DIAGNOSIS — M9902 Segmental and somatic dysfunction of thoracic region: Secondary | ICD-10-CM | POA: Diagnosis not present

## 2019-03-02 DIAGNOSIS — M9903 Segmental and somatic dysfunction of lumbar region: Secondary | ICD-10-CM | POA: Diagnosis not present

## 2019-03-02 DIAGNOSIS — M9905 Segmental and somatic dysfunction of pelvic region: Secondary | ICD-10-CM | POA: Diagnosis not present

## 2019-03-02 DIAGNOSIS — M9901 Segmental and somatic dysfunction of cervical region: Secondary | ICD-10-CM | POA: Diagnosis not present

## 2019-03-03 ENCOUNTER — Other Ambulatory Visit: Payer: Self-pay | Admitting: Internal Medicine

## 2019-03-08 DIAGNOSIS — M9905 Segmental and somatic dysfunction of pelvic region: Secondary | ICD-10-CM | POA: Diagnosis not present

## 2019-03-08 DIAGNOSIS — M9903 Segmental and somatic dysfunction of lumbar region: Secondary | ICD-10-CM | POA: Diagnosis not present

## 2019-03-08 DIAGNOSIS — M9902 Segmental and somatic dysfunction of thoracic region: Secondary | ICD-10-CM | POA: Diagnosis not present

## 2019-03-08 DIAGNOSIS — M9901 Segmental and somatic dysfunction of cervical region: Secondary | ICD-10-CM | POA: Diagnosis not present

## 2019-03-16 DIAGNOSIS — M9901 Segmental and somatic dysfunction of cervical region: Secondary | ICD-10-CM | POA: Diagnosis not present

## 2019-03-16 DIAGNOSIS — M9902 Segmental and somatic dysfunction of thoracic region: Secondary | ICD-10-CM | POA: Diagnosis not present

## 2019-03-16 DIAGNOSIS — M9905 Segmental and somatic dysfunction of pelvic region: Secondary | ICD-10-CM | POA: Diagnosis not present

## 2019-03-16 DIAGNOSIS — M9903 Segmental and somatic dysfunction of lumbar region: Secondary | ICD-10-CM | POA: Diagnosis not present

## 2019-03-22 DIAGNOSIS — M9905 Segmental and somatic dysfunction of pelvic region: Secondary | ICD-10-CM | POA: Diagnosis not present

## 2019-03-22 DIAGNOSIS — M9903 Segmental and somatic dysfunction of lumbar region: Secondary | ICD-10-CM | POA: Diagnosis not present

## 2019-03-22 DIAGNOSIS — M9902 Segmental and somatic dysfunction of thoracic region: Secondary | ICD-10-CM | POA: Diagnosis not present

## 2019-03-22 DIAGNOSIS — M9901 Segmental and somatic dysfunction of cervical region: Secondary | ICD-10-CM | POA: Diagnosis not present

## 2019-03-30 DIAGNOSIS — M9905 Segmental and somatic dysfunction of pelvic region: Secondary | ICD-10-CM | POA: Diagnosis not present

## 2019-03-30 DIAGNOSIS — M9901 Segmental and somatic dysfunction of cervical region: Secondary | ICD-10-CM | POA: Diagnosis not present

## 2019-03-30 DIAGNOSIS — M9902 Segmental and somatic dysfunction of thoracic region: Secondary | ICD-10-CM | POA: Diagnosis not present

## 2019-03-30 DIAGNOSIS — M9903 Segmental and somatic dysfunction of lumbar region: Secondary | ICD-10-CM | POA: Diagnosis not present

## 2019-04-13 DIAGNOSIS — M9905 Segmental and somatic dysfunction of pelvic region: Secondary | ICD-10-CM | POA: Diagnosis not present

## 2019-04-13 DIAGNOSIS — M9902 Segmental and somatic dysfunction of thoracic region: Secondary | ICD-10-CM | POA: Diagnosis not present

## 2019-04-13 DIAGNOSIS — M9903 Segmental and somatic dysfunction of lumbar region: Secondary | ICD-10-CM | POA: Diagnosis not present

## 2019-04-13 DIAGNOSIS — M9901 Segmental and somatic dysfunction of cervical region: Secondary | ICD-10-CM | POA: Diagnosis not present

## 2019-04-20 DIAGNOSIS — M9901 Segmental and somatic dysfunction of cervical region: Secondary | ICD-10-CM | POA: Diagnosis not present

## 2019-04-20 DIAGNOSIS — M9902 Segmental and somatic dysfunction of thoracic region: Secondary | ICD-10-CM | POA: Diagnosis not present

## 2019-04-20 DIAGNOSIS — M9903 Segmental and somatic dysfunction of lumbar region: Secondary | ICD-10-CM | POA: Diagnosis not present

## 2019-04-20 DIAGNOSIS — M9905 Segmental and somatic dysfunction of pelvic region: Secondary | ICD-10-CM | POA: Diagnosis not present

## 2019-04-26 ENCOUNTER — Encounter: Payer: Self-pay | Admitting: Cardiovascular Disease

## 2019-04-26 ENCOUNTER — Other Ambulatory Visit: Payer: Self-pay

## 2019-04-26 ENCOUNTER — Ambulatory Visit: Payer: Medicare HMO | Admitting: Cardiovascular Disease

## 2019-04-26 DIAGNOSIS — M9901 Segmental and somatic dysfunction of cervical region: Secondary | ICD-10-CM | POA: Diagnosis not present

## 2019-04-26 DIAGNOSIS — R002 Palpitations: Secondary | ICD-10-CM

## 2019-04-26 DIAGNOSIS — G4733 Obstructive sleep apnea (adult) (pediatric): Secondary | ICD-10-CM | POA: Diagnosis not present

## 2019-04-26 DIAGNOSIS — I341 Nonrheumatic mitral (valve) prolapse: Secondary | ICD-10-CM

## 2019-04-26 DIAGNOSIS — R000 Tachycardia, unspecified: Secondary | ICD-10-CM | POA: Diagnosis not present

## 2019-04-26 DIAGNOSIS — M9905 Segmental and somatic dysfunction of pelvic region: Secondary | ICD-10-CM | POA: Diagnosis not present

## 2019-04-26 DIAGNOSIS — M9902 Segmental and somatic dysfunction of thoracic region: Secondary | ICD-10-CM | POA: Diagnosis not present

## 2019-04-26 DIAGNOSIS — M9903 Segmental and somatic dysfunction of lumbar region: Secondary | ICD-10-CM | POA: Diagnosis not present

## 2019-04-26 MED ORDER — METOPROLOL TARTRATE 50 MG PO TABS: ORAL_TABLET | ORAL | 3 refills | Status: DC

## 2019-04-26 NOTE — Assessment & Plan Note (Signed)
History of palpitations likely related to PACs improved on low-dose beta-blockade.

## 2019-04-26 NOTE — Progress Notes (Signed)
04/26/2019 Ruth Sharp   [birth year]  [id number]  Primary Physician Benito Mccreedy, MD Primary Cardiologist: Lorretta Harp MD FACP, [facility], [place], [place]  HPI:  Ruth Sharp is a 68 y.o.  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 She had been seeing cardiology group in [place].  I last saw her in the office 04/20/2017.  She has a history of palpitations thought to be related to PACs on event monitor on low-dose beta blocker. She has mild mitral valve prolapse with mild to moderate MR by 2-D echo and a negative stress test in the past. She does have symptoms of obstructive sleep apnea as well.  I saw her a year ago she did well.  She denies chest pain or shortness of breath.   Current Meds  Medication Sig  . Calcium Carbonate-Vitamin D (CALTRATE 600+D PO) Take 1 tablet by mouth daily.  . hydroxychloroquine (PLAQUENIL) 200 MG tablet Take 2 tablets (400 mg total) by mouth daily.  . metoprolol tartrate (LOPRESSOR) 50 MG tablet TAKE 1 AND 1/2 TABLETS BY MOUTH TWICE DAILY  . Multiple Vitamins-Minerals (CENTRUM SILVER) CHEW Chew 1 each by mouth daily.  . RESTASIS 0.05 % ophthalmic emulsion Place 1 drop into both eyes 2 (two) times daily.   . TURMERIC PO Take by mouth.     Allergies  Allergen Reactions  . Keflex [Cephalexin] Rash    Social History   Socioeconomic History  . Marital status: Legally Separated    Spouse name: Not on file  . Number of children: Not on file  . Years of education: Not on file  . Highest education level: Not on file  Occupational History  . Not on file  Social Needs  . Financial resource strain: Not on file  . Food insecurity    Worry: Not on file    Inability: Not on file  . Transportation needs    Medical: Not on file    Non-medical: Not on file  Tobacco Use  . Smoking status: Never Smoker  .  Smokeless tobacco: Never Used  Substance and Sexual Activity  . Alcohol use: Yes    Comment: occasionally  . Drug use: Never  . Sexual activity: Not on file  Lifestyle  . Physical activity    Days per week: Not on file    Minutes per session: Not on file  . Stress: Not on file  Relationships  . Social Herbalist on phone: Not on file    Gets together: Not on file    Attends religious service: Not on file    Active member of club or organization: Not on file    Attends meetings of clubs or organizations: Not on file    Relationship status: Not on file  . Intimate partner violence    Fear of current or ex partner: Not on file    Emotionally abused: Not on file    Physically abused: Not on file    Forced sexual activity: Not on file  Other Topics Concern  . Not on file  Social History Narrative  . Not on file     Review of Systems: General: negative for chills, fever, night sweats or weight changes.  Cardiovascular: negative for chest pain, dyspnea on exertion, edema, orthopnea, palpitations, paroxysmal nocturnal dyspnea or shortness of breath Dermatological: negative  for rash Respiratory: negative for cough or wheezing Urologic: negative for hematuria Abdominal: negative for nausea, vomiting, diarrhea, bright red blood per rectum, melena, or hematemesis Neurologic: negative for visual changes, syncope, or dizziness All other systems reviewed and are otherwise negative except as noted above.    Blood pressure 101/78, pulse 62, height 5\' 6"  (1.676 m), weight 190 lb (86.2 kg), SpO2 96 %.  General appearance: alert and no distress Neck: no adenopathy, no carotid bruit, no JVD, supple, symmetrical, trachea midline and thyroid not enlarged, symmetric, no tenderness/mass/nodules Lungs: clear to auscultation bilaterally Heart: regular rate and rhythm, S1, S2 normal, no murmur, click, rub or gallop Extremities: extremities normal, atraumatic, no cyanosis or edema  Pulses: 2+ and symmetric Skin: Skin color, texture, turgor normal. No rashes or lesions Neurologic: Alert and oriented X 3, normal strength and tone. Normal symmetric reflexes. Normal coordination and gait  EKG sinus rhythm at 62 without ST or T wave changes.  I personally reviewed this EKG.  ASSESSMENT AND PLAN:   Tachycardia History of palpitations likely related to PACs improved on low-dose beta-blockade.  Mitral valve prolapse Prior history of mitral valve prolapse not demonstrated on 2D echo performed 05/04/2017.  She did have mild MR.      Lorretta Harp MD FACP,FACC,FAHA, [facility] 04/26/2019 4:02 PM

## 2019-04-26 NOTE — Assessment & Plan Note (Signed)
Prior history of mitral valve prolapse not demonstrated on 2D echo performed 05/04/2017.  She did have mild MR.

## 2019-04-26 NOTE — Patient Instructions (Addendum)

## 2019-05-01 DIAGNOSIS — H5211 Myopia, right eye: Secondary | ICD-10-CM | POA: Diagnosis not present

## 2019-05-03 DIAGNOSIS — M9901 Segmental and somatic dysfunction of cervical region: Secondary | ICD-10-CM | POA: Diagnosis not present

## 2019-05-03 DIAGNOSIS — M9903 Segmental and somatic dysfunction of lumbar region: Secondary | ICD-10-CM | POA: Diagnosis not present

## 2019-05-03 DIAGNOSIS — M9902 Segmental and somatic dysfunction of thoracic region: Secondary | ICD-10-CM | POA: Diagnosis not present

## 2019-05-03 DIAGNOSIS — M9905 Segmental and somatic dysfunction of pelvic region: Secondary | ICD-10-CM | POA: Diagnosis not present

## 2019-05-17 DIAGNOSIS — M9903 Segmental and somatic dysfunction of lumbar region: Secondary | ICD-10-CM | POA: Diagnosis not present

## 2019-05-17 DIAGNOSIS — M9902 Segmental and somatic dysfunction of thoracic region: Secondary | ICD-10-CM | POA: Diagnosis not present

## 2019-05-17 DIAGNOSIS — M9905 Segmental and somatic dysfunction of pelvic region: Secondary | ICD-10-CM | POA: Diagnosis not present

## 2019-05-17 DIAGNOSIS — M9901 Segmental and somatic dysfunction of cervical region: Secondary | ICD-10-CM | POA: Diagnosis not present

## 2019-05-18 ENCOUNTER — Encounter: Payer: Self-pay | Admitting: Podiatry

## 2019-05-18 ENCOUNTER — Ambulatory Visit (INDEPENDENT_AMBULATORY_CARE_PROVIDER_SITE_OTHER): Payer: Medicare HMO

## 2019-05-18 ENCOUNTER — Ambulatory Visit: Payer: Medicare HMO | Admitting: Podiatry

## 2019-05-18 ENCOUNTER — Other Ambulatory Visit: Payer: Self-pay

## 2019-05-18 VITALS — BP 111/67 | HR 62

## 2019-05-18 DIAGNOSIS — M722 Plantar fascial fibromatosis: Secondary | ICD-10-CM | POA: Diagnosis not present

## 2019-05-18 MED ORDER — MELOXICAM 15 MG PO TABS: 15.0000 mg | ORAL_TABLET | Freq: Every day | ORAL | 3 refills | Status: DC

## 2019-05-18 MED ORDER — METHYLPREDNISOLONE 4 MG PO TBPK: ORAL_TABLET | ORAL | 0 refills | Status: DC

## 2019-05-18 NOTE — Patient Instructions (Addendum)

## 2019-05-18 NOTE — Progress Notes (Signed)
  Subjective:  Patient ID: Ruth Sharp, female    DOB: 1951-05-19,  MRN: [id number] HPI Chief Complaint  Patient presents with  . Plantar Fasciitis    Lt foot mid heel pain x 2 weeks, worsens with prolonged standing, h/o heel spur. TX: advil and rest    68 y.o. female presents with the above complaint.   ROS: Denies fever chills nausea vomiting muscle aches pains calf pain back pain chest pain shortness of breath.  Past Medical History:  Diagnosis Date  . Arthritis   . Tachycardia    Past Surgical History:  Procedure Laterality Date  . ABDOMINAL HYSTERECTOMY    . BREAST EXCISIONAL BIOPSY Right    benign  . knee replacements      Current Outpatient Medications:  .  Calcium Carbonate-Vitamin D (CALTRATE 600+D PO), Take 1 tablet by mouth daily., Disp: , Rfl:  .  hydroxychloroquine (PLAQUENIL) 200 MG tablet, Take 2 tablets (400 mg total) by mouth daily., Disp: 90 tablet, Rfl: 3 .  meloxicam (MOBIC) 15 MG tablet, Take 1 tablet (15 mg total) by mouth daily., Disp: 30 tablet, Rfl: 3 .  methylPREDNISolone (MEDROL DOSEPAK) 4 MG TBPK tablet, 6 day dose pack - take as directed, Disp: 21 tablet, Rfl: 0 .  metoprolol tartrate (LOPRESSOR) 50 MG tablet, TAKE 1 AND 1/2 TABLETS BY MOUTH TWICE DAILY, Disp: 270 tablet, Rfl: 3 .  Multiple Vitamins-Minerals (CENTRUM SILVER) CHEW, Chew 1 each by mouth daily., Disp: , Rfl:  .  RESTASIS 0.05 % ophthalmic emulsion, Place 1 drop into both eyes 2 (two) times daily. , Disp: , Rfl:  .  TURMERIC PO, Take by mouth., Disp: , Rfl:   Allergies  Allergen Reactions  . Keflex [Cephalexin] Rash  . Tape Rash   Review of Systems Objective:   Vitals:   05/18/19 0906  BP: 111/67  Pulse: 62    General: Well developed, nourished, in no acute distress, alert and oriented x3   Dermatological: Skin is warm, dry and supple bilateral. Nails x 10 are well maintained; remaining integument appears unremarkable at this time. There are no open sores, no  preulcerative lesions, no rash or signs of infection present.  Vascular: Dorsalis Pedis artery and Posterior Tibial artery pedal pulses are 2/4 bilateral with immedate capillary fill time. Pedal hair growth present. No varicosities and no lower extremity edema present bilateral.   Neruologic: Grossly intact via light touch bilateral. Vibratory intact via tuning fork bilateral. Protective threshold with Semmes Wienstein monofilament intact to all pedal sites bilateral. Patellar and Achilles deep tendon reflexes 2+ bilateral. No Babinski or clonus noted bilateral.   Musculoskeletal: No gross boney pedal deformities bilateral. No pain, crepitus, or limitation noted with foot and ankle range of motion bilateral. Muscular strength 5/5 in all groups tested bilateral.  Pain on palpation medial calcaneal tubercle of the right heel.  Gait: Unassisted, Nonantalgic.    Radiographs:  Radiographs taken today demonstrate soft tissue increase in density plantar fashion calcaneal insertion site of the right heel.  Assessment & Plan:   Assessment: Plantar fasciitis right.  Plan: Discussed etiology pathology conservative versus surgical therapies.  At this point injected the right heel with 20 mg Kenalog 5 mg Marcaine for maximal tenderness of the right heel.  Tolerated seizure well without complications.  Start her on Medrol Dosepak to be followed by meloxicam.  Backslash brace and a night splint.  Follow-up with her in 1 month     Emmah Bratcher T. Martorell, [place]

## 2019-05-23 DIAGNOSIS — R69 Illness, unspecified: Secondary | ICD-10-CM | POA: Diagnosis not present

## 2019-05-25 DIAGNOSIS — Z6831 Body mass index (BMI) 31.0-31.9, adult: Secondary | ICD-10-CM | POA: Diagnosis not present

## 2019-05-25 DIAGNOSIS — M25471 Effusion, right ankle: Secondary | ICD-10-CM | POA: Diagnosis not present

## 2019-05-25 DIAGNOSIS — M0579 Rheumatoid arthritis with rheumatoid factor of multiple sites without organ or systems involvement: Secondary | ICD-10-CM | POA: Diagnosis not present

## 2019-05-25 DIAGNOSIS — M2550 Pain in unspecified joint: Secondary | ICD-10-CM | POA: Diagnosis not present

## 2019-05-25 DIAGNOSIS — E669 Obesity, unspecified: Secondary | ICD-10-CM | POA: Diagnosis not present

## 2019-05-25 DIAGNOSIS — M150 Primary generalized (osteo)arthritis: Secondary | ICD-10-CM | POA: Diagnosis not present

## 2019-05-25 DIAGNOSIS — R5383 Other fatigue: Secondary | ICD-10-CM | POA: Diagnosis not present

## 2019-06-01 DIAGNOSIS — M9903 Segmental and somatic dysfunction of lumbar region: Secondary | ICD-10-CM | POA: Diagnosis not present

## 2019-06-01 DIAGNOSIS — M9902 Segmental and somatic dysfunction of thoracic region: Secondary | ICD-10-CM | POA: Diagnosis not present

## 2019-06-01 DIAGNOSIS — M9901 Segmental and somatic dysfunction of cervical region: Secondary | ICD-10-CM | POA: Diagnosis not present

## 2019-06-01 DIAGNOSIS — M9905 Segmental and somatic dysfunction of pelvic region: Secondary | ICD-10-CM | POA: Diagnosis not present

## 2019-06-14 DIAGNOSIS — M9903 Segmental and somatic dysfunction of lumbar region: Secondary | ICD-10-CM | POA: Diagnosis not present

## 2019-06-14 DIAGNOSIS — M9905 Segmental and somatic dysfunction of pelvic region: Secondary | ICD-10-CM | POA: Diagnosis not present

## 2019-06-14 DIAGNOSIS — M9901 Segmental and somatic dysfunction of cervical region: Secondary | ICD-10-CM | POA: Diagnosis not present

## 2019-06-14 DIAGNOSIS — M9902 Segmental and somatic dysfunction of thoracic region: Secondary | ICD-10-CM | POA: Diagnosis not present

## 2019-06-20 ENCOUNTER — Other Ambulatory Visit: Payer: Self-pay

## 2019-06-20 ENCOUNTER — Ambulatory Visit (INDEPENDENT_AMBULATORY_CARE_PROVIDER_SITE_OTHER): Payer: Medicare HMO | Admitting: Podiatry

## 2019-06-20 ENCOUNTER — Encounter: Payer: Self-pay | Admitting: Podiatry

## 2019-06-20 DIAGNOSIS — M722 Plantar fascial fibromatosis: Secondary | ICD-10-CM

## 2019-06-21 ENCOUNTER — Encounter: Payer: Self-pay | Admitting: Podiatry

## 2019-06-21 NOTE — Progress Notes (Signed)
Presents today for follow-up of the right heel states that is better but is not pain-free yet.  90% improved  Objective: Vital signs are stable alert and oriented x3.  Pulses are palpable.  No reproducible tenderness on palpation today.  Assessment: Resolving plantar fasciitis.  90% improved  Plan: Continue current therapies.  Plantar fascial braces night splint and oral medication.

## 2019-06-28 DIAGNOSIS — M9903 Segmental and somatic dysfunction of lumbar region: Secondary | ICD-10-CM | POA: Diagnosis not present

## 2019-06-28 DIAGNOSIS — M9901 Segmental and somatic dysfunction of cervical region: Secondary | ICD-10-CM | POA: Diagnosis not present

## 2019-06-28 DIAGNOSIS — M9902 Segmental and somatic dysfunction of thoracic region: Secondary | ICD-10-CM | POA: Diagnosis not present

## 2019-06-28 DIAGNOSIS — M9905 Segmental and somatic dysfunction of pelvic region: Secondary | ICD-10-CM | POA: Diagnosis not present

## 2019-07-12 DIAGNOSIS — M9902 Segmental and somatic dysfunction of thoracic region: Secondary | ICD-10-CM | POA: Diagnosis not present

## 2019-07-12 DIAGNOSIS — M9901 Segmental and somatic dysfunction of cervical region: Secondary | ICD-10-CM | POA: Diagnosis not present

## 2019-07-12 DIAGNOSIS — M9903 Segmental and somatic dysfunction of lumbar region: Secondary | ICD-10-CM | POA: Diagnosis not present

## 2019-07-12 DIAGNOSIS — M9905 Segmental and somatic dysfunction of pelvic region: Secondary | ICD-10-CM | POA: Diagnosis not present

## 2019-07-26 DIAGNOSIS — M9901 Segmental and somatic dysfunction of cervical region: Secondary | ICD-10-CM | POA: Diagnosis not present

## 2019-07-26 DIAGNOSIS — M9903 Segmental and somatic dysfunction of lumbar region: Secondary | ICD-10-CM | POA: Diagnosis not present

## 2019-07-26 DIAGNOSIS — M9905 Segmental and somatic dysfunction of pelvic region: Secondary | ICD-10-CM | POA: Diagnosis not present

## 2019-07-26 DIAGNOSIS — M9902 Segmental and somatic dysfunction of thoracic region: Secondary | ICD-10-CM | POA: Diagnosis not present

## 2019-08-08 DIAGNOSIS — M9903 Segmental and somatic dysfunction of lumbar region: Secondary | ICD-10-CM | POA: Diagnosis not present

## 2019-08-08 DIAGNOSIS — M9905 Segmental and somatic dysfunction of pelvic region: Secondary | ICD-10-CM | POA: Diagnosis not present

## 2019-08-08 DIAGNOSIS — M9901 Segmental and somatic dysfunction of cervical region: Secondary | ICD-10-CM | POA: Diagnosis not present

## 2019-08-08 DIAGNOSIS — M9902 Segmental and somatic dysfunction of thoracic region: Secondary | ICD-10-CM | POA: Diagnosis not present

## 2019-08-15 DIAGNOSIS — Z0000 Encounter for general adult medical examination without abnormal findings: Secondary | ICD-10-CM | POA: Diagnosis not present

## 2019-08-16 ENCOUNTER — Other Ambulatory Visit: Payer: Self-pay | Admitting: Family Medicine

## 2019-08-16 DIAGNOSIS — Z1231 Encounter for screening mammogram for malignant neoplasm of breast: Secondary | ICD-10-CM

## 2019-08-17 ENCOUNTER — Other Ambulatory Visit: Payer: Self-pay | Admitting: Family Medicine

## 2019-08-17 DIAGNOSIS — E2839 Other primary ovarian failure: Secondary | ICD-10-CM

## 2019-08-22 DIAGNOSIS — C44111 Basal cell carcinoma of skin of unspecified eyelid, including canthus: Secondary | ICD-10-CM | POA: Diagnosis not present

## 2019-08-22 DIAGNOSIS — D485 Neoplasm of uncertain behavior of skin: Secondary | ICD-10-CM | POA: Diagnosis not present

## 2019-08-22 DIAGNOSIS — D2272 Melanocytic nevi of left lower limb, including hip: Secondary | ICD-10-CM | POA: Diagnosis not present

## 2019-08-22 DIAGNOSIS — Z23 Encounter for immunization: Secondary | ICD-10-CM | POA: Diagnosis not present

## 2019-08-22 DIAGNOSIS — D225 Melanocytic nevi of trunk: Secondary | ICD-10-CM | POA: Diagnosis not present

## 2019-08-22 DIAGNOSIS — Z872 Personal history of diseases of the skin and subcutaneous tissue: Secondary | ICD-10-CM | POA: Diagnosis not present

## 2019-08-30 ENCOUNTER — Other Ambulatory Visit: Payer: Self-pay | Admitting: Podiatry

## 2019-08-30 NOTE — Telephone Encounter (Signed)
Refill this?

## 2019-10-05 ENCOUNTER — Ambulatory Visit
Admission: RE | Admit: 2019-10-05 | Discharge: 2019-10-05 | Disposition: A | Discharge status: Home / Self Care | Payer: Medicare HMO | Source: Ambulatory Visit | Attending: Family Medicine | Admitting: Family Medicine

## 2019-10-05 ENCOUNTER — Other Ambulatory Visit: Payer: Self-pay

## 2019-10-05 DIAGNOSIS — Z1231 Encounter for screening mammogram for malignant neoplasm of breast: Secondary | ICD-10-CM

## 2019-11-01 DIAGNOSIS — C441192 Basal cell carcinoma of skin of left lower eyelid, including canthus: Secondary | ICD-10-CM | POA: Diagnosis not present

## 2019-11-18 DIAGNOSIS — R69 Illness, unspecified: Secondary | ICD-10-CM | POA: Diagnosis not present

## 2019-11-18 DIAGNOSIS — Z809 Family history of malignant neoplasm, unspecified: Secondary | ICD-10-CM | POA: Diagnosis not present

## 2019-11-18 DIAGNOSIS — Z881 Allergy status to other antibiotic agents status: Secondary | ICD-10-CM | POA: Diagnosis not present

## 2019-11-18 DIAGNOSIS — H04129 Dry eye syndrome of unspecified lacrimal gland: Secondary | ICD-10-CM | POA: Diagnosis not present

## 2019-11-18 DIAGNOSIS — Z8249 Family history of ischemic heart disease and other diseases of the circulatory system: Secondary | ICD-10-CM | POA: Diagnosis not present

## 2019-11-18 DIAGNOSIS — I10 Essential (primary) hypertension: Secondary | ICD-10-CM | POA: Diagnosis not present

## 2019-11-18 DIAGNOSIS — M069 Rheumatoid arthritis, unspecified: Secondary | ICD-10-CM | POA: Diagnosis not present

## 2019-11-18 DIAGNOSIS — Z9181 History of falling: Secondary | ICD-10-CM | POA: Diagnosis not present

## 2019-11-22 DIAGNOSIS — M2550 Pain in unspecified joint: Secondary | ICD-10-CM | POA: Diagnosis not present

## 2019-11-22 DIAGNOSIS — M0579 Rheumatoid arthritis with rheumatoid factor of multiple sites without organ or systems involvement: Secondary | ICD-10-CM | POA: Diagnosis not present

## 2019-11-22 DIAGNOSIS — E669 Obesity, unspecified: Secondary | ICD-10-CM | POA: Diagnosis not present

## 2019-11-22 DIAGNOSIS — R5383 Other fatigue: Secondary | ICD-10-CM | POA: Diagnosis not present

## 2019-11-22 DIAGNOSIS — M25471 Effusion, right ankle: Secondary | ICD-10-CM | POA: Diagnosis not present

## 2019-11-22 DIAGNOSIS — M150 Primary generalized (osteo)arthritis: Secondary | ICD-10-CM | POA: Diagnosis not present

## 2020-02-07 DIAGNOSIS — M0579 Rheumatoid arthritis with rheumatoid factor of multiple sites without organ or systems involvement: Secondary | ICD-10-CM | POA: Diagnosis not present

## 2020-03-11 ENCOUNTER — Ambulatory Visit
Admission: RE | Admit: 2020-03-11 | Discharge: 2020-03-11 | Disposition: A | Discharge status: Home / Self Care | Payer: Medicare HMO | Source: Ambulatory Visit | Attending: Family Medicine | Admitting: Family Medicine

## 2020-03-11 ENCOUNTER — Other Ambulatory Visit: Payer: Self-pay

## 2020-03-11 DIAGNOSIS — Z780 Asymptomatic menopausal state: Secondary | ICD-10-CM | POA: Diagnosis not present

## 2020-03-11 DIAGNOSIS — M8588 Other specified disorders of bone density and structure, other site: Secondary | ICD-10-CM | POA: Diagnosis not present

## 2020-03-11 DIAGNOSIS — E2839 Other primary ovarian failure: Secondary | ICD-10-CM

## 2020-04-17 DIAGNOSIS — R69 Illness, unspecified: Secondary | ICD-10-CM | POA: Diagnosis not present

## 2020-04-17 DIAGNOSIS — R1013 Epigastric pain: Secondary | ICD-10-CM | POA: Diagnosis not present

## 2020-04-17 DIAGNOSIS — F0631 Mood disorder due to known physiological condition with depressive features: Secondary | ICD-10-CM | POA: Diagnosis not present

## 2020-04-18 ENCOUNTER — Other Ambulatory Visit: Payer: Self-pay | Admitting: Cardiovascular Disease

## 2020-04-18 DIAGNOSIS — R002 Palpitations: Secondary | ICD-10-CM

## 2020-04-18 DIAGNOSIS — I341 Nonrheumatic mitral (valve) prolapse: Secondary | ICD-10-CM

## 2020-04-18 DIAGNOSIS — G4733 Obstructive sleep apnea (adult) (pediatric): Secondary | ICD-10-CM

## 2020-04-22 DIAGNOSIS — Z20822 Contact with and (suspected) exposure to covid-19: Secondary | ICD-10-CM | POA: Diagnosis not present

## 2020-04-29 ENCOUNTER — Other Ambulatory Visit: Payer: Self-pay | Admitting: Cardiovascular Disease

## 2020-04-29 DIAGNOSIS — I341 Nonrheumatic mitral (valve) prolapse: Secondary | ICD-10-CM

## 2020-04-29 DIAGNOSIS — R002 Palpitations: Secondary | ICD-10-CM

## 2020-04-29 DIAGNOSIS — G4733 Obstructive sleep apnea (adult) (pediatric): Secondary | ICD-10-CM

## 2020-05-01 DIAGNOSIS — H0100A Unspecified blepharitis right eye, upper and lower eyelids: Secondary | ICD-10-CM | POA: Diagnosis not present

## 2020-05-01 DIAGNOSIS — Z79899 Other long term (current) drug therapy: Secondary | ICD-10-CM | POA: Diagnosis not present

## 2020-05-01 DIAGNOSIS — Z961 Presence of intraocular lens: Secondary | ICD-10-CM | POA: Diagnosis not present

## 2020-05-01 DIAGNOSIS — H16223 Keratoconjunctivitis sicca, not specified as Sjogren's, bilateral: Secondary | ICD-10-CM | POA: Diagnosis not present

## 2020-05-02 DIAGNOSIS — R05 Cough: Secondary | ICD-10-CM | POA: Diagnosis not present

## 2020-05-02 DIAGNOSIS — J029 Acute pharyngitis, unspecified: Secondary | ICD-10-CM | POA: Diagnosis not present

## 2020-05-02 DIAGNOSIS — R490 Dysphonia: Secondary | ICD-10-CM | POA: Diagnosis not present

## 2020-05-08 DIAGNOSIS — R1013 Epigastric pain: Secondary | ICD-10-CM | POA: Diagnosis not present

## 2020-05-08 DIAGNOSIS — F0631 Mood disorder due to known physiological condition with depressive features: Secondary | ICD-10-CM | POA: Diagnosis not present

## 2020-05-08 DIAGNOSIS — R69 Illness, unspecified: Secondary | ICD-10-CM | POA: Diagnosis not present

## 2020-05-13 ENCOUNTER — Other Ambulatory Visit: Payer: Self-pay | Admitting: Cardiovascular Disease

## 2020-05-13 DIAGNOSIS — G4733 Obstructive sleep apnea (adult) (pediatric): Secondary | ICD-10-CM

## 2020-05-13 DIAGNOSIS — R002 Palpitations: Secondary | ICD-10-CM

## 2020-05-13 DIAGNOSIS — I341 Nonrheumatic mitral (valve) prolapse: Secondary | ICD-10-CM

## 2020-05-27 DIAGNOSIS — R5383 Other fatigue: Secondary | ICD-10-CM | POA: Diagnosis not present

## 2020-05-27 DIAGNOSIS — M150 Primary generalized (osteo)arthritis: Secondary | ICD-10-CM | POA: Diagnosis not present

## 2020-05-27 DIAGNOSIS — M8589 Other specified disorders of bone density and structure, multiple sites: Secondary | ICD-10-CM | POA: Diagnosis not present

## 2020-05-27 DIAGNOSIS — M25471 Effusion, right ankle: Secondary | ICD-10-CM | POA: Diagnosis not present

## 2020-05-27 DIAGNOSIS — Z6830 Body mass index (BMI) 30.0-30.9, adult: Secondary | ICD-10-CM | POA: Diagnosis not present

## 2020-05-27 DIAGNOSIS — M0579 Rheumatoid arthritis with rheumatoid factor of multiple sites without organ or systems involvement: Secondary | ICD-10-CM | POA: Diagnosis not present

## 2020-05-27 DIAGNOSIS — M2550 Pain in unspecified joint: Secondary | ICD-10-CM | POA: Diagnosis not present

## 2020-05-27 DIAGNOSIS — E669 Obesity, unspecified: Secondary | ICD-10-CM | POA: Diagnosis not present

## 2020-05-28 ENCOUNTER — Encounter: Payer: Self-pay | Admitting: Cardiovascular Disease

## 2020-05-28 ENCOUNTER — Ambulatory Visit (INDEPENDENT_AMBULATORY_CARE_PROVIDER_SITE_OTHER): Payer: Medicare HMO | Admitting: Cardiovascular Disease

## 2020-05-28 ENCOUNTER — Other Ambulatory Visit: Payer: Self-pay

## 2020-05-28 DIAGNOSIS — R002 Palpitations: Secondary | ICD-10-CM | POA: Diagnosis not present

## 2020-05-28 DIAGNOSIS — E782 Mixed hyperlipidemia: Secondary | ICD-10-CM

## 2020-05-28 DIAGNOSIS — G4733 Obstructive sleep apnea (adult) (pediatric): Secondary | ICD-10-CM

## 2020-05-28 DIAGNOSIS — E785 Hyperlipidemia, unspecified: Secondary | ICD-10-CM | POA: Insufficient documentation

## 2020-05-28 DIAGNOSIS — I341 Nonrheumatic mitral (valve) prolapse: Secondary | ICD-10-CM | POA: Diagnosis not present

## 2020-05-28 MED ORDER — METOPROLOL TARTRATE 50 MG PO TABS
ORAL_TABLET | ORAL | 3 refills | Status: DC
Start: 2020-05-28 — End: 2020-06-06

## 2020-05-28 NOTE — Assessment & Plan Note (Signed)
History of a positive sleep study but without significant symptoms that would warrant CPAP.

## 2020-05-28 NOTE — Assessment & Plan Note (Signed)
Prior history of "mitral valve prolapse" with 2D echo performed 05/04/2017 that showed only mild MR without prolapse.

## 2020-05-28 NOTE — Assessment & Plan Note (Signed)
History of hyperlipidemia with an LDL of 130 on 05/03/2018.  We will recheck a fasting lipid liver profile.

## 2020-05-28 NOTE — Patient Instructions (Signed)
Medication Instructions:  Your physician recommends that you continue on your current medications as directed. Please refer to the Current Medication list given to you today.  Lab Work: Lipid, Hepatic today  If you have labs (blood work) drawn today and your tests are completely normal, you will receive your results only by: Marland Kitchen MyChart Message (if you have MyChart) OR . A paper copy in the mail If you have any lab test that is abnormal or we need to change your treatment, we will call you to review the results.  Follow-Up: At [facility], you and your health needs are our priority.  As part of our continuing mission to provide you with exceptional heart care, we have created designated Provider Care Teams.  These Care Teams include your primary Cardiologist (physician) and Advanced Practice Providers (APPs -  Physician Assistants and Nurse Practitioners) who all work together to provide you with the care you need, when you need it.  We recommend signing up for the patient portal called "MyChart".  Sign up information is provided on this After Visit Summary.  MyChart is used to connect with patients for Virtual Visits (Telemedicine).  Patients are able to view lab/test results, encounter notes, upcoming appointments, etc.  Non-urgent messages can be sent to your provider as well.   To learn more about what you can do with MyChart, go to [email or web address].    Your next appointment:   12 month(s)  The format for your next appointment:   In Person  Provider:   Quay Burow, MD

## 2020-05-28 NOTE — Progress Notes (Signed)
05/28/2020 Ruth Sharp   [birth year]  [id number]  Primary Physician [name], MD Primary Cardiologist: Lorretta Harp MD FACP, [place], [place], [place]  HPI:  Ruth Sharp is a 69 y.o.  separated, mother of one child with no grandchildren who worked in a gym in the past and recently relocated from [place] down to [place] to be closer to family. She had been seeing cardiology group in [place].  I last saw her in the office 04/26/2019.  She has a history of palpitations thought to be related to PACs on event monitor on low-dose beta blocker she had a 2D echo performed 05/04/2017 that revealed normal LV systolic function, grade 2 diastolic dysfunction and mild MR without prolapse.  She did have symptoms of obstructive sleep apnea as well, and apparently had a positive sleep study but has not pursued CPAP.  I saw her a year ago she did well.  She denies chest pain or shortness of breath.    Current Meds  Medication Sig  . Calcium Carbonate-Vitamin D (CALTRATE 600+D PO) Take 1 tablet by mouth daily.  . hydroxychloroquine (PLAQUENIL) 200 MG tablet Take 2 tablets (400 mg total) by mouth daily.  . metoprolol tartrate (LOPRESSOR) 50 MG tablet TAKE 1&1/2 TABLETS BY MOUTH TWICE DAILY. PATIENT NEEDS TO SCHEDULE A FOLLOW UP APPOINTMENT  . Multiple Vitamins-Minerals (CENTRUM SILVER) CHEW Chew 1 each by mouth daily.  . RESTASIS 0.05 % ophthalmic emulsion Place 1 drop into both eyes 2 (two) times daily.   . TURMERIC PO Take by mouth.     Allergies  Allergen Reactions  . Keflex [Cephalexin] Rash  . Tape Rash    Social History   Socioeconomic History  . Marital status: Legally Separated    Spouse name: Not on file  . Number of children: Not on file  . Years of education: Not on file  . Highest education level: Not on file  Occupational History  . Not on file  Tobacco Use  . Smoking status: Never Smoker  . Smokeless tobacco: Never Used  Vaping Use  . Vaping Use:  Never used  Substance and Sexual Activity  . Alcohol use: Yes    Comment: occasionally  . Drug use: Never  . Sexual activity: Not on file  Other Topics Concern  . Not on file  Social History Narrative  . Not on file   Social Determinants of Health   Financial Resource Strain:   . Difficulty of Paying Living Expenses: Not on file  Food Insecurity:   . Worried About Charity fundraiser in the Last Year: Not on file  . Ran Out of Food in the Last Year: Not on file  Transportation Needs:   . Lack of Transportation (Medical): Not on file  . Lack of Transportation (Non-Medical): Not on file  Physical Activity:   . Days of Exercise per Week: Not on file  . Minutes of Exercise per Session: Not on file  Stress:   . Feeling of Stress : Not on file  Social Connections:   . Frequency of Communication with Friends and Family: Not on file  . Frequency of Social Gatherings with Friends and Family: Not on file  . Attends Religious Services: Not on file  . Active Member of Clubs or Organizations: Not on file  . Attends Archivist Meetings: Not on file  . Marital Status: Not on file  Intimate Partner Violence:   . Fear of Current or Ex-Partner:  Not on file  . Emotionally Abused: Not on file  . Physically Abused: Not on file  . Sexually Abused: Not on file     Review of Systems: General: negative for chills, fever, night sweats or weight changes.  Cardiovascular: negative for chest pain, dyspnea on exertion, edema, orthopnea, palpitations, paroxysmal nocturnal dyspnea or shortness of breath Dermatological: negative for rash Respiratory: negative for cough or wheezing Urologic: negative for hematuria Abdominal: negative for nausea, vomiting, diarrhea, bright red blood per rectum, melena, or hematemesis Neurologic: negative for visual changes, syncope, or dizziness All other systems reviewed and are otherwise negative except as noted above.    Blood pressure 108/78, pulse 64,  height 5\' 7"  (1.702 m), weight 186 lb 3.2 oz (84.5 kg), SpO2 98 %.  General appearance: alert and no distress Neck: no adenopathy, no carotid bruit, no JVD, supple, symmetrical, trachea midline and thyroid not enlarged, symmetric, no tenderness/mass/nodules Lungs: clear to auscultation bilaterally Heart: regular rate and rhythm, S1, S2 normal, no murmur, click, rub or gallop Extremities: extremities normal, atraumatic, no cyanosis or edema Pulses: 2+ and symmetric Skin: Skin color, texture, turgor normal. No rashes or lesions Neurologic: Alert and oriented X 3, normal strength and tone. Normal symmetric reflexes. Normal coordination and gait  EKG sinus rhythm at 64 without ST or T wave changes.  Personally reviewed this EKG.  ASSESSMENT AND PLAN:   Mitral valve prolapse Prior history of "mitral valve prolapse" with 2D echo performed 05/04/2017 that showed only mild MR without prolapse.  Obstructive sleep apnea History of a positive sleep study but without significant symptoms that would warrant CPAP.  Palpitations History of palpitations in the past thought to be PACs on event monitoring.  She no longer has these.  Hyperlipidemia History of hyperlipidemia with an LDL of 130 on 05/03/2018.  We will recheck a fasting lipid liver profile.      Lorretta Harp MD FACP,FACC,FAHA, [facility] 05/28/2020 12:16 PM

## 2020-05-28 NOTE — Assessment & Plan Note (Signed)
History of palpitations in the past thought to be PACs on event monitoring.  She no longer has these.

## 2020-05-29 LAB — LIPID PANEL
Chol/HDL Ratio: 3.2 ratio (ref 0.0–4.4)
Cholesterol, Total: 227 mg/dL — ABNORMAL HIGH (ref 100–199)
HDL: 72 mg/dL (ref >39)
LDL Chol Calc (NIH): 134 mg/dL — ABNORMAL HIGH (ref 0–99)
Triglycerides: 121 mg/dL (ref 0–149)
VLDL Cholesterol Cal: 21 mg/dL (ref 5–40)

## 2020-05-29 LAB — HEPATIC FUNCTION PANEL
ALT: 19 IU/L (ref 0–32)
AST: 33 IU/L (ref 0–40)
Albumin: 4.7 g/dL (ref 3.8–4.8)
Alkaline Phosphatase: 83 IU/L (ref 44–121)
Bilirubin Total: 0.6 mg/dL (ref 0.0–1.2)
Bilirubin, Direct: 0.14 mg/dL (ref 0.00–0.40)
Total Protein: 6.8 g/dL (ref 6.0–8.5)

## 2020-05-30 ENCOUNTER — Other Ambulatory Visit: Payer: Self-pay

## 2020-05-30 DIAGNOSIS — E7800 Pure hypercholesterolemia, unspecified: Secondary | ICD-10-CM

## 2020-05-30 MED ORDER — ATORVASTATIN CALCIUM 20 MG PO TABS: 20.0000 mg | ORAL_TABLET | Freq: Every day | ORAL | 3 refills | Status: DC

## 2020-05-30 NOTE — Progress Notes (Signed)
lipitor

## 2020-06-03 DIAGNOSIS — R69 Illness, unspecified: Secondary | ICD-10-CM | POA: Diagnosis not present

## 2020-06-03 DIAGNOSIS — F411 Generalized anxiety disorder: Secondary | ICD-10-CM | POA: Diagnosis not present

## 2020-06-05 ENCOUNTER — Telehealth: Payer: Self-pay | Admitting: Physician Assistant

## 2020-06-05 ENCOUNTER — Other Ambulatory Visit: Payer: Self-pay | Admitting: Cardiovascular Disease

## 2020-06-05 DIAGNOSIS — R002 Palpitations: Secondary | ICD-10-CM

## 2020-06-05 DIAGNOSIS — I341 Nonrheumatic mitral (valve) prolapse: Secondary | ICD-10-CM

## 2020-06-05 DIAGNOSIS — G4733 Obstructive sleep apnea (adult) (pediatric): Secondary | ICD-10-CM

## 2020-06-05 NOTE — Telephone Encounter (Signed)
*  STAT* If patient is at the pharmacy, call can be transferred to refill team.   1. Which medications need to be refilled? (please list name of each medication and dose if known)   metoprolol tartrate (LOPRESSOR) 50 MG tablet    2. Which pharmacy/location (including street and city if local pharmacy) is medication to be sent to?  [id number] - [place], [place] - [address]. Do they need a 30 day or 90 day supply?  90 day  

## 2020-06-05 NOTE — Telephone Encounter (Signed)
Received a new hem referral from Ruth Sharp for thrombocytosis. Ruth Sharp has been cld and scheduled to see Ruth Sharp on 9/28 at 130pm w/ labs at 1pm. Pt aware to arrive 20 minutes early.

## 2020-06-06 MED ORDER — METOPROLOL TARTRATE 50 MG PO TABS: ORAL_TABLET | ORAL | 3 refills | Status: DC

## 2020-06-06 NOTE — Telephone Encounter (Signed)
Requested Prescriptions   Signed Prescriptions Disp Refills  . metoprolol tartrate (LOPRESSOR) 50 MG tablet 135 tablet 3    Sig: TAKE 1&1/2 TABLETS BY MOUTH TWICE DAILY.    Authorizing Provider: Lorretta Harp    Ordering User: Britt Bottom

## 2020-06-09 ENCOUNTER — Other Ambulatory Visit: Payer: Self-pay | Admitting: Physician Assistant

## 2020-06-09 DIAGNOSIS — D75839 Thrombocytosis, unspecified: Secondary | ICD-10-CM

## 2020-06-09 NOTE — Progress Notes (Signed)
[place] [facility] Telephone:(336) [phone]   Fax:(336) [phone]  CONSULT NOTE  REFERRING PHYSICIAN: Marella Chimes PA-C  REASON FOR CONSULTATION:  Thrombocytosis   HPI Ruth Sharp is a 68 y.o. female with a past medical history significant for rheumatoid arthritis, mitral valve prolapse, obstructive sleep apnea, tachycardia, depression, and hyperlipidemia is referred to the clinic for evaluation of thrombocytosis.  The patient had a routine visit with her rheumatologist regarding her rheumatoid arthritis. The patient had lab work performed at that visit in September 2021 which noted persistent thrombocytosis. The white blood cell count and hemoglobin was within normal limits. Per chart review, it was noted on the referral that the patient has had persistent thrombocytosis for several years. Per records available to me, the patient had a CBC performed in 2018 which noted thrombocytosis with a platelet count of 590k. The rest of the CBC was unremarkable. The patient was referred to the clinic for evaluation and to rule out any other pathology of the thrombocytosis.   To the patient's knowledge she believes that she has been told she has had elevated platelets for at least 3 years, but she believes she was told that prior to moving here from [place] in 2018. Overall the patient is feeling fairly well today without any concerning complaints. Denies any fever, chills, night sweats, or weight loss. Denies any lymphadenopathy. Denies any recent signs or symptoms of infection including fevers, sore throat, cough, shortness of breath, skin infections, dysuria, or diarrhea. The patient denies any nausea, vomiting, constipation, abdominal pain, or abdominal fullness. The patient denies any abnormal bleeding or bruising including epistaxis, gingival bleeding, hemoptysis, hematemesis, melena, hematochezia, or hematuria. She had a cologuard test 1-2 years ago that was reportedly normal. The patient  denies any steroid use. The patient denies any abnormal bug or tick bites. Besides having rheumatoid arthritis, the patient denies any other known inflammatory conditions. She denies any history of CVA or MI. She denies headaches or visual changes.   Her family history is significant for a sister who reportedly has a "blood issue" that would "be leukemia if it got worse". Her sister reportedly takes a chemotherapy pill for this problem. This is suspicious for being essential thrombocythemia, which is what we are evaluating the patient for as well today. Otherwise, she denies any other family history of any blood disorders, bone marrow disorders, or other malignancies except for a father who had melanoma. The patient's mother had hyperlipidemia and a "leaky" valve.   The patient used to work at a gym.  She is divorced and has 1 child.  She denies any drug or tobacco history.  She drinks approximately 1-2 alcoholic beverages per week.    HPI  Past Medical History:  Diagnosis Date  . Arthritis   . Tachycardia     Past Surgical History:  Procedure Laterality Date  . ABDOMINAL HYSTERECTOMY    . BREAST EXCISIONAL BIOPSY Right    benign  . knee replacements      Family History  Problem Relation Age of Onset  . Heart failure Mother     Social History Social History   Tobacco Use  . Smoking status: Never Smoker  . Smokeless tobacco: Never Used  Vaping Use  . Vaping Use: Never used  Substance Use Topics  . Alcohol use: Yes    Comment: occasionally  . Drug use: Never    Allergies  Allergen Reactions  . Keflex [Cephalexin] Rash  . Tape Rash  Current Outpatient Medications  Medication Sig Dispense Refill  . atorvastatin (LIPITOR) 20 MG tablet Take 1 tablet (20 mg total) by mouth daily. 90 tablet 3  . Calcium Carbonate-Vitamin D (CALTRATE 600+D PO) Take 1 tablet by mouth daily.    . hydroxychloroquine (PLAQUENIL) 200 MG tablet Take 2 tablets (400 mg total) by mouth daily. 90  tablet 3  . metoprolol tartrate (LOPRESSOR) 50 MG tablet TAKE 1&1/2 TABLETS BY MOUTH TWICE DAILY. 135 tablet 3  . Multiple Vitamins-Minerals (CENTRUM SILVER) CHEW Chew 1 each by mouth daily.    . RESTASIS 0.05 % ophthalmic emulsion Place 1 drop into both eyes 2 (two) times daily.     . TURMERIC PO Take by mouth.     No current facility-administered medications for this visit.    Review of Systems REVIEW OF SYSTEMS:   Review of Systems  Constitutional: Negative for appetite change, chills, fatigue, fever and unexpected weight change.  HENT:   Negative for mouth sores, nosebleeds, sore throat and trouble swallowing.   Eyes: Negative for eye problems and icterus.  Respiratory: Negative for cough, hemoptysis, shortness of breath and wheezing.   Cardiovascular: Negative for chest pain and leg swelling.  Gastrointestinal: Negative for abdominal pain, constipation, diarrhea, nausea and vomiting.  Genitourinary: Negative for bladder incontinence, difficulty urinating, dysuria, frequency and hematuria.   Musculoskeletal: Negative for back pain, gait problem, neck pain and neck stiffness.  Skin: Negative for itching and rash.  Neurological: Negative for dizziness, extremity weakness, gait problem, headaches, light-headedness and seizures.  Hematological: Negative for adenopathy. Does not bruise/bleed easily.  Psychiatric/Behavioral: Negative for confusion, depression and sleep disturbance. The patient is not nervous/anxious.     PHYSICAL EXAMINATION:  Blood pressure 104/64, pulse 72, temperature 97.6 F (36.4 C), temperature source Tympanic, resp. rate 18, height 5\' 7"  (1.702 m), weight 187 lb 14.4 oz (85.2 kg), SpO2 98 %.  ECOG PERFORMANCE STATUS: 0  Physical Exam  Constitutional: Oriented to person, place, and time and well-developed, well-nourished, and in no distress.  HENT:  Head: Normocephalic and atraumatic.  Mouth/Throat: Oropharynx is clear and moist. No oropharyngeal exudate.    Eyes: Conjunctivae are normal. Right eye exhibits no discharge. Left eye exhibits no discharge. No scleral icterus.  Neck: Normal range of motion. Neck supple.  Cardiovascular: Normal rate, regular rhythm, normal heart sounds and intact distal pulses.   Pulmonary/Chest: Effort normal and breath sounds normal. No respiratory distress. No wheezes. No rales.  Abdominal: Soft. Bowel sounds are normal. Exhibits no distension and no mass. There is no tenderness.  Musculoskeletal: Normal range of motion. Exhibits no edema.  Lymphadenopathy:    No cervical adenopathy.  Neurological: Alert and oriented to person, place, and time. Exhibits normal muscle tone. Gait normal. Coordination normal.  Skin: Skin is warm and dry. No rash noted. Not diaphoretic. No erythema. No pallor.  Psychiatric: Mood, memory and judgment normal.  Vitals reviewed.  LABORATORY DATA: Lab Results  Component Value Date   WBC 7.8 06/11/2020   HGB 11.9 (L) 06/11/2020   HCT 39.1 06/11/2020   MCV 75.8 (L) 06/11/2020   PLT 645 (H) 06/11/2020      Chemistry      Component Value Date/Time   NA 140 06/11/2020 1251   NA 142 04/20/2017 1033   K 4.3 06/11/2020 1251   CL 107 06/11/2020 1251   CO2 27 06/11/2020 1251   BUN 17 06/11/2020 1251   BUN 16 04/20/2017 1033   CREATININE 1.14 (H) 06/11/2020 1251  Component Value Date/Time   CALCIUM 9.3 06/11/2020 1251   ALKPHOS 69 06/11/2020 1251   AST 31 06/11/2020 1251   ALT 21 06/11/2020 1251   BILITOT 0.9 06/11/2020 1251       RADIOGRAPHIC STUDIES: No results found.  ASSESSMENT: This is a very pleasant 69 year old Caucasian female referred to the clinic for evaluation of thrombocytosis.    PLAN: The patient was seen with Dr. Julien Nordmann today. The patient had several lab studies performed including a CBC, CMP, ferritin, iron studies, and [name]2 mutation testing. The patient CBC demonstrated an elevated platelet count of 645k.  Her CBC also showed a low MCV at 75.8 and  elevated RDW at 16.8. The patient's hemoglobin was 11.9.  Her iron studies demonstrate iron deficiency anemia. She also had [name]2 mutation testing performed to assess for a primary myeloproliferative disorder such as essential thrombocythemia, which is suspicious given her family history.  We will see the patient back for follow-up visit in 2 weeks for repeat CBC and to review the results of her molecular studies.  In the meantime, she was instructed to talk to her sister to further clarify what her diagnosis is.   I will send in a prescription for an oral iron supplement to take p.o. daily. I attempted to call the patient with this information but was unable to reach her and asked for her to return our call.   The patient was advised to call immediately if she has any concerning symptoms in the interval. The patient voices understanding of current disease status and treatment options and is in agreement with the current care plan. All questions were answered. The patient knows to call the clinic with any problems, questions or concerns. We can certainly see the patient much sooner if necessary   The patient voices understanding of current disease status and treatment options and is in agreement with the current care plan.  All questions were answered. The patient knows to call the clinic with any problems, questions or concerns. We can certainly see the patient much sooner if necessary.  Thank you so much for allowing me to participate in the care of JOSPHINE LAFFEY. I will continue to follow up the patient with you and assist in her care.  The total time spent in the appointment was 60 minutes.  Disclaimer: This note was dictated with voice recognition software. Similar sounding words can inadvertently be transcribed and may not be corrected upon review.   Gari Trovato L Ryu Cerreta June 11, 2020, 2:02 PM  ADDENDUM: Hematology/oncology Attending: I had a face-to-face encounter with the  patient today.  I recommended her care plan.  This is a very pleasant 69 years old white female with no significant past medical history except for arthritis and tachycardia.  The patient was seen by her primary care physician and was noted on several lab work to have elevated platelets count.  She also mentioned that her sister has a blood disorder and she has to take a chemotherapy oral drug for that condition. We are definitely concerned about the possibility of essential thrombocythemia but reactive thrombocytosis secondary to iron deficiency is also a consideration..  We will order several studies including repeat CBC, comprehensive metabolic panel, iron study and ferritin as well as [name]2 mutation. Her iron study today showed low serum iron of 34 and iron saturation of 7% but ferritin level was normal at 17. We will start the patient on Integra +1 capsule p.o. daily. She will come back for follow-up  visit in 2 weeks for evaluation and discussion of the pending blood work as well as repeat CBC. The patient was advised to call immediately if she has any other concerning symptoms in the interval.  Disclaimer: This note was dictated with voice recognition software. Similar sounding words can inadvertently be transcribed and may be missed upon review. Eilleen Kempf, MD 06/11/20

## 2020-06-11 ENCOUNTER — Inpatient Hospital Stay: Payer: Medicare HMO | Attending: Physician Assistant | Admitting: Physician Assistant

## 2020-06-11 ENCOUNTER — Encounter: Payer: Self-pay | Admitting: Physician Assistant

## 2020-06-11 ENCOUNTER — Other Ambulatory Visit: Payer: Self-pay

## 2020-06-11 ENCOUNTER — Inpatient Hospital Stay: Payer: Medicare HMO

## 2020-06-11 ENCOUNTER — Telehealth: Payer: Self-pay | Admitting: Physician Assistant

## 2020-06-11 VITALS — BP 104/64 | HR 72 | Temp 97.6°F | Resp 18 | Ht 67.0 in | Wt 187.9 lb

## 2020-06-11 DIAGNOSIS — D509 Iron deficiency anemia, unspecified: Secondary | ICD-10-CM | POA: Diagnosis not present

## 2020-06-11 DIAGNOSIS — D473 Essential (hemorrhagic) thrombocythemia: Secondary | ICD-10-CM

## 2020-06-11 DIAGNOSIS — M069 Rheumatoid arthritis, unspecified: Secondary | ICD-10-CM | POA: Diagnosis not present

## 2020-06-11 DIAGNOSIS — D75839 Thrombocytosis, unspecified: Secondary | ICD-10-CM | POA: Insufficient documentation

## 2020-06-11 LAB — CMP (CANCER CENTER ONLY)
ALT: 21 U/L (ref 0–44)
AST: 31 U/L (ref 15–41)
Albumin: 4 g/dL (ref 3.5–5.0)
Alkaline Phosphatase: 69 U/L (ref 38–126)
Anion gap: 6 (ref 5–15)
BUN: 17 mg/dL (ref 8–23)
CO2: 27 mmol/L (ref 22–32)
Calcium: 9.3 mg/dL (ref 8.9–10.3)
Chloride: 107 mmol/L (ref 98–111)
Creatinine: 1.14 mg/dL — ABNORMAL HIGH (ref 0.44–1.00)
GFR, Est AFR Am: 57 mL/min — ABNORMAL LOW (ref >60)
GFR, Estimated: 49 mL/min — ABNORMAL LOW (ref >60)
Glucose, Bld: 70 mg/dL (ref 70–99)
Potassium: 4.3 mmol/L (ref 3.5–5.1)
Sodium: 140 mmol/L (ref 135–145)
Total Bilirubin: 0.9 mg/dL (ref 0.3–1.2)
Total Protein: 7.1 g/dL (ref 6.5–8.1)

## 2020-06-11 LAB — CBC WITH DIFFERENTIAL (CANCER CENTER ONLY)
Abs Immature Granulocytes: 0.03 10*3/uL (ref 0.00–0.07)
Basophils Absolute: 0.1 10*3/uL (ref 0.0–0.1)
Basophils Relative: 1 %
Eosinophils Absolute: 0.1 10*3/uL (ref 0.0–0.5)
Eosinophils Relative: 2 %
HCT: 39.1 % (ref 36.0–46.0)
Hemoglobin: 11.9 g/dL — ABNORMAL LOW (ref 12.0–15.0)
Immature Granulocytes: 0 %
Lymphocytes Relative: 13 %
Lymphs Abs: 1 10*3/uL (ref 0.7–4.0)
MCH: 23.1 pg — ABNORMAL LOW (ref 26.0–34.0)
MCHC: 30.4 g/dL (ref 30.0–36.0)
MCV: 75.8 fL — ABNORMAL LOW (ref 80.0–100.0)
Monocytes Absolute: 0.6 10*3/uL (ref 0.1–1.0)
Monocytes Relative: 7 %
Neutro Abs: 6 10*3/uL (ref 1.7–7.7)
Neutrophils Relative %: 77 %
Platelet Count: 645 10*3/uL — ABNORMAL HIGH (ref 150–400)
RBC: 5.16 MIL/uL — ABNORMAL HIGH (ref 3.87–5.11)
RDW: 16.8 % — ABNORMAL HIGH (ref 11.5–15.5)
WBC Count: 7.8 10*3/uL (ref 4.0–10.5)
nRBC: 0 % (ref 0.0–0.2)

## 2020-06-11 LAB — FERRITIN: Ferritin: 17 ng/mL (ref 11–307)

## 2020-06-11 LAB — IRON AND TIBC
Iron: 34 ug/dL — ABNORMAL LOW (ref 41–142)
Saturation Ratios: 7 % — ABNORMAL LOW (ref 21–57)
TIBC: 475 ug/dL — ABNORMAL HIGH (ref 236–444)
UIBC: 441 ug/dL — ABNORMAL HIGH (ref 120–384)

## 2020-06-11 MED ORDER — INTEGRA PLUS PO CAPS: 1.0000 | ORAL_CAPSULE | Freq: Every morning | ORAL | 2 refills | Status: DC

## 2020-06-11 NOTE — Telephone Encounter (Signed)
Scheduled appointment per 9/28 los. Gave patient calendar print out.

## 2020-06-12 DIAGNOSIS — R69 Illness, unspecified: Secondary | ICD-10-CM | POA: Diagnosis not present

## 2020-06-18 LAB — JAK2 (INCLUDING V617F AND EXON 12), MPL,& CALR-NEXT GEN SEQ

## 2020-06-25 ENCOUNTER — Ambulatory Visit: Payer: Medicare HMO | Admitting: Physician Assistant

## 2020-06-25 ENCOUNTER — Other Ambulatory Visit: Payer: Medicare HMO

## 2020-06-30 NOTE — Progress Notes (Signed)
[facility] OFF[facility] PROGRESS NOTE  Ruth Lass, MD [address] [place] [address]  DIAGNOSIS: Essential Thrombocythemia. [name] 2 mutation positive.   PRIOR THERAPY: None  CURRENT THERAPY: 1) Hydroxyurea 500 mg p.o. daily 2) Integra plus 1 tablet p.o. daily  INTERVAL HISTORY: Ruth Sharp 69 y.o. female returns to the clinic today for a follow up visit. The patient was first seen on 06/11/20 after being referred for thrombocytosis. The patient noted that her sister actually was diagnosed with a "blood issue" and took a "chemotherapy pill", which was highly suspicious for essential thrombocythemia. She also was found to be iron deficiency and she was started on oral iron supplements with integra plus. She has been tolerating that well. She states her fatigue is "not bad". Overall, the patient is feeling well today without any concerning complaints except she has noticed that she has been sweating more.  She denies any recent fever, chills, night sweats, weight loss, or lymphadenopathy.  She denies any recent signs or symptoms of infection.  He denies any abnormal bleeding or bruising.  She denies any headache, visual changes or dizziness. The patient denies any history of DVT, MI, CVA, or PE. The patient was recently referred to the clinic for evaluation of thrombocytosis. The patient had molecular testing with [name]2 performed.  The patient's [name]2 mutation was positive and she is here today for evaluation and a more detailed discussion about her current condition and recommended treatment.  MEDICAL HISTORY: Past Medical History:  Diagnosis Date  . Arthritis   . Tachycardia     ALLERGIES:  is allergic to keflex [cephalexin] and tape.  MEDICATIONS:  Current Outpatient Medications  Medication Sig Dispense Refill  . atorvastatin (LIPITOR) 20 MG tablet Take 1 tablet (20 mg total) by mouth daily. 90 tablet 3  . buPROPion (WELLBUTRIN) 75 MG tablet Take 75 mg by mouth 2  (two) times daily.    . Calcium Carbonate-Vitamin D (CALTRATE 600+D PO) Take 1 tablet by mouth daily.    Marland Kitchen FeFum-FePoly-FA-B Cmp-C-Biot (INTEGRA PLUS) CAPS Take 1 capsule by mouth every morning. 30 capsule 2  . hydroxychloroquine (PLAQUENIL) 200 MG tablet Take 2 tablets (400 mg total) by mouth daily. 90 tablet 3  . metoprolol tartrate (LOPRESSOR) 50 MG tablet TAKE 1&1/2 TABLETS BY MOUTH TWICE DAILY. 135 tablet 3  . Multiple Vitamins-Minerals (CENTRUM SILVER) CHEW Chew 1 each by mouth daily.    . RESTASIS 0.05 % ophthalmic emulsion Place 1 drop into both eyes 2 (two) times daily.     . TURMERIC PO Take by mouth.    . hydroxyurea (HYDREA) 500 MG capsule Take 1 capsule (500 mg total) by mouth daily. May take with food to minimize GI side effects. 30 capsule 2   No current facility-administered medications for this visit.    SURGICAL HISTORY:  Past Surgical History:  Procedure Laterality Date  . ABDOMINAL HYSTERECTOMY    . BREAST EXCISIONAL BIOPSY Right    benign  . knee replacements      REVIEW OF SYSTEMS:   Review of Systems  Constitutional: Negative for appetite change, chills, fatigue, fever and unexpected weight change.  HENT: Negative for mouth sores, nosebleeds, sore throat and trouble swallowing.   Eyes: Negative for eye problems and icterus.  Respiratory: Negative for cough, hemoptysis, shortness of breath and wheezing.   Cardiovascular: Negative for chest pain and leg swelling.  Gastrointestinal: Negative for abdominal pain, constipation, diarrhea, nausea and vomiting.  Genitourinary: Negative for bladder incontinence,  difficulty urinating, dysuria, frequency and hematuria.   Musculoskeletal: Negative for back pain, gait problem, neck pain and neck stiffness.  Skin: Positive for sweating more often. Negative for itching and rash.  Neurological: Negative for dizziness, extremity weakness, gait problem, headaches, light-headedness and seizures.  Hematological: Negative for  adenopathy. Does not bruise/bleed easily.  Psychiatric/Behavioral: Negative for confusion, depression and sleep disturbance. The patient is not nervous/anxious.     PHYSICAL EXAMINATION:  Blood pressure 112/71, pulse 64, temperature (!) 97.3 F (36.3 C), temperature source Tympanic, resp. rate 17, height 5\' 7"  (1.702 m), weight 182 lb 4.8 oz (82.7 kg), SpO2 99 %.  ECOG PERFORMANCE STATUS: 0 - Asymptomatic  Physical Exam  Constitutional: Oriented to person, place, and time and well-developed, well-nourished, and in no distress.  HENT:  Head: Normocephalic and atraumatic.  Mouth/Throat: Oropharynx is clear and moist. No oropharyngeal exudate.  Eyes: Conjunctivae are normal. Right eye exhibits no discharge. Left eye exhibits no discharge. No scleral icterus.  Neck: Normal range of motion. Neck supple.  Cardiovascular: Normal rate, regular rhythm, normal heart sounds and intact distal pulses.   Pulmonary/Chest: Effort normal and breath sounds normal. No respiratory distress. No wheezes. No rales.  Abdominal: Soft. Bowel sounds are normal. Exhibits no distension and no mass. There is no tenderness.  Musculoskeletal: Normal range of motion. Exhibits no edema.  Lymphadenopathy:    No cervical adenopathy.  Neurological: Alert and oriented to person, place, and time. Exhibits normal muscle tone. Gait normal. Coordination normal.  Skin: Skin is warm and dry. No rash noted. Not diaphoretic. No erythema. No pallor.  Psychiatric: Mood, memory and judgment normal.  Vitals reviewed.  LABORATORY DATA: Lab Results  Component Value Date   WBC 7.2 07/03/2020   HGB 12.5 07/03/2020   HCT 41.2 07/03/2020   MCV 76.7 (L) 07/03/2020   PLT 699 (H) 07/03/2020      Chemistry      Component Value Date/Time   NA 140 06/11/2020 1251   NA 142 04/20/2017 1033   K 4.3 06/11/2020 1251   CL 107 06/11/2020 1251   CO2 27 06/11/2020 1251   BUN 17 06/11/2020 1251   BUN 16 04/20/2017 1033   CREATININE 1.14  (H) 06/11/2020 1251      Component Value Date/Time   CALCIUM 9.3 06/11/2020 1251   ALKPHOS 69 06/11/2020 1251   AST 31 06/11/2020 1251   ALT 21 06/11/2020 1251   BILITOT 0.9 06/11/2020 1251       RADIOGRAPHIC STUDIES:  No results found.   ASSESSMENT/PLAN:  This is a very pleasant 69 year old [place] female with essential thrombocythemia, [name] 2 mutation positive. She was diagnosed in October 2021.   I reviewed the patient's Jake 2 results with her and I discussed that the patient's molecular studies showed that she is [name] 2 mutation positive, which is positive in 65% of the patient's with essential thrombocythemia.   The patient's repeat CBC today shows a platelet count of 699k.   The recommended treatment for this condition is hydroxyurea 500 mg p.o. daily. The goal of treatment is to keep her platelet count to be in a near normal range (~ <400k). Discussed with the patient that the risks associated with elevated platelets include blood clots as well as bleeding if significantly elevated. The patient has given an education handout on hydroxyurea and essential thrombocythemia.   She is interested in treatment. I have sent a prescription to her pharmacy of hydrea 500 mg p.o. daily.  We will see the patient back for follow-up visit in 2 weeks for evaluation and repeat blood work.  I have sent a refill of her iron studies to her pharmacy. We will recheck her iron studies in a few weeks at future lab appointments.   The patient was advised to call immediately if she has any concerning symptoms in the interval. The patient voices understanding of current disease status and treatment options and is in agreement with the current care plan. All questions were answered. The patient knows to call the clinic with any problems, questions or concerns. We can certainly see the patient much sooner if necessary    Orders Placed This Encounter  Procedures  . CBC with Differential (Cancer  Center Only)    Standing Status:   Future    Standing Expiration Date:   07/03/2021     Leiland Mihelich L Kimberlyn Quiocho, PA-C 07/03/20

## 2020-07-01 DIAGNOSIS — F411 Generalized anxiety disorder: Secondary | ICD-10-CM | POA: Diagnosis not present

## 2020-07-01 DIAGNOSIS — R69 Illness, unspecified: Secondary | ICD-10-CM | POA: Diagnosis not present

## 2020-07-03 ENCOUNTER — Inpatient Hospital Stay: Payer: Medicare HMO | Attending: Physician Assistant

## 2020-07-03 ENCOUNTER — Encounter: Payer: Self-pay | Admitting: Physician Assistant

## 2020-07-03 ENCOUNTER — Inpatient Hospital Stay: Payer: Medicare HMO | Admitting: Physician Assistant

## 2020-07-03 ENCOUNTER — Other Ambulatory Visit: Payer: Self-pay

## 2020-07-03 VITALS — BP 112/71 | HR 64 | Temp 97.3°F | Resp 17 | Ht 67.0 in | Wt 182.3 lb

## 2020-07-03 DIAGNOSIS — D473 Essential (hemorrhagic) thrombocythemia: Secondary | ICD-10-CM | POA: Insufficient documentation

## 2020-07-03 DIAGNOSIS — D75839 Thrombocytosis, unspecified: Secondary | ICD-10-CM

## 2020-07-03 LAB — CBC WITH DIFFERENTIAL (CANCER CENTER ONLY)
Abs Immature Granulocytes: 0.01 10*3/uL (ref 0.00–0.07)
Basophils Absolute: 0.1 10*3/uL (ref 0.0–0.1)
Basophils Relative: 1 %
Eosinophils Absolute: 0.2 10*3/uL (ref 0.0–0.5)
Eosinophils Relative: 3 %
HCT: 41.2 % (ref 36.0–46.0)
Hemoglobin: 12.5 g/dL (ref 12.0–15.0)
Immature Granulocytes: 0 %
Lymphocytes Relative: 22 %
Lymphs Abs: 1.6 10*3/uL (ref 0.7–4.0)
MCH: 23.3 pg — ABNORMAL LOW (ref 26.0–34.0)
MCHC: 30.3 g/dL (ref 30.0–36.0)
MCV: 76.7 fL — ABNORMAL LOW (ref 80.0–100.0)
Monocytes Absolute: 0.7 10*3/uL (ref 0.1–1.0)
Monocytes Relative: 10 %
Neutro Abs: 4.6 10*3/uL (ref 1.7–7.7)
Neutrophils Relative %: 64 %
Platelet Count: 699 10*3/uL — ABNORMAL HIGH (ref 150–400)
RBC: 5.37 MIL/uL — ABNORMAL HIGH (ref 3.87–5.11)
RDW: 20 % — ABNORMAL HIGH (ref 11.5–15.5)
WBC Count: 7.2 10*3/uL (ref 4.0–10.5)
nRBC: 0 % (ref 0.0–0.2)

## 2020-07-03 MED ORDER — HYDROXYUREA 500 MG PO CAPS: 500.0000 mg | ORAL_CAPSULE | Freq: Every day | ORAL | 2 refills | Status: DC

## 2020-07-03 MED ORDER — INTEGRA PLUS PO CAPS: 1.0000 | ORAL_CAPSULE | Freq: Every morning | ORAL | 2 refills | Status: DC

## 2020-07-03 NOTE — Patient Instructions (Addendum)
Hydroxyurea capsules What is this medicine? HYDROXYUREA (hye drox ee yoor EE a) is a chemotherapy drug. This medicine is used to treat certain types of leukemias and head and neck cancer. It is also used to control the painful crises of sickle cell anemia. This medicine may be used for other purposes; ask your health care provider or pharmacist if you have questions. COMMON BRAND NAME(S): Droxia, Hydrea What should I tell my health care provider before I take this medicine? They need to know if you have any of these conditions:  gout or high levels of uric acid in the blood  HIV or AIDS  kidney disease or on hemodialysis  leg wounds or ulcers  low blood counts, like low white cell, platelet, or red cell counts  prior or current interferon therapy  recent or ongoing radiation therapy  scheduled to receive a vaccine  an unusual or allergic reaction to hydroxyurea, other medicines, foods, dyes, or preservatives  pregnant or trying to get pregnant  breast-feeding How should I use this medicine? Take this medicine by mouth with a glass of water. Follow the directions on the prescription label. Take your medicine at regular intervals. Do not take it more often than directed. Do not stop taking except on your doctor's advice. People who are not taking this medicine should not be exposed to it. Wash your hands before and after handling your bottle or medicine. Caregivers should wear disposable gloves if they must touch the bottle or medicine. Clean up any medicine powder that spills with a damp disposable towel and throw the towel away in a closed container, such as a plastic bag. A special MedGuide will be given to you by the pharmacist with each prescription and refill. Be sure to read this information carefully each time. Talk to your pediatrician regarding the use of this medicine in children. Special care may be needed. Overdosage: If you think you have taken too much of this  medicine contact a poison control center or emergency room at once. NOTE: This medicine is only for you. Do not share this medicine with others. What if I miss a dose? If you miss a dose, take it as soon as you can. If it is almost time for your next dose, take only that dose. Do not take double or extra doses. What may interact with this medicine? This medicine may also interact with the following medications:  didanosine  stavudine  live virus vaccines This list may not describe all possible interactions. Give your health care provider a list of all the medicines, herbs, non-prescription drugs, or dietary supplements you use. Also tell them if you smoke, drink alcohol, or use illegal drugs. Some items may interact with your medicine. What should I watch for while using this medicine? This drug may make you feel generally unwell. This is not uncommon, as chemotherapy can affect healthy cells as well as cancer cells. Report any side effects. Continue your course of treatment even though you feel ill unless your doctor tells you to stop. You will receive regular blood tests during your treatment. Call your doctor or health care professional for advice if you get a fever, chills or sore throat, or other symptoms of a cold or flu. Do not treat yourself. This drug decreases your body's ability to fight infections. Try to avoid being around people who are sick. This medicine may increase your risk to bruise or bleed. Call your doctor or health care professional if you notice any unusual   bleeding. Talk to your doctor about your risk of cancer. You may be more at risk for certain types of cancers if you take this medicine. Keep out of the sun. If you cannot avoid being in the sun, wear protective clothing and use sunscreen. Do not use sun lamps or tanning beds/booths. Do not become pregnant while taking this medicine or for at least 6 months after stopping it. Women should inform their doctor if they  wish to become pregnant or think they might be pregnant. Men should not father a child while taking this medicine and for at least a year after stopping it. There is a potential for serious side effects to an unborn child. Talk to your health care professional or pharmacist for more information. Do not breast-feed an infant while taking this medicine. This may interfere with the ability to have or father a child. You should talk with your doctor or health care professional if you are concerned about your fertility. What side effects may I notice from receiving this medicine? Side effects that you should report to your doctor or health care professional as soon as possible:  allergic reactions like skin rash, itching or hives, swelling of the face, lips, or tongue  breathing problems  burning, redness or pain at the site of any radiation therapy  low blood counts - this medicine may decrease the number of white blood cells, red blood cells and platelets. You may be at increased risk for infections and bleeding.  signs of decreased platelets or bleeding - bruising, pinpoint red spots on the skin, black, tarry stools, blood in the urine  signs of decreased red blood cells - unusually weak or tired, fainting spells, lightheadedness  signs of infection - fever or chills, cough, sore throat, pain or difficulty passing urine  signs and symptoms of bleeding such as bloody or black, tarry stools; red or dark-brown urine; spitting up blood or brown material that looks like coffee grounds; red spots on the skin; unusual bruising or bleeding from the eye, gums, or nose  skin ulcers Side effects that usually do not require medical attention (report to your doctor or health care professional if they continue or are bothersome):  constipation  diarrhea  loss of appetite  mouth sores  nausea This list may not describe all possible side effects. Call your doctor for medical advice about side effects.  You may report side effects to FDA at 1-800-FDA-1088. Where should I keep my medicine? Keep out of the reach of children. See product for storage instructions. Each product may have different instructions. Keep tightly closed. Throw away any unused medicine after the expiration date. NOTE: This sheet is a summary. It may not cover all possible information. If you have questions about this medicine, talk to your doctor, pharmacist, or health care provider.  2020 Elsevier/Gold Standard (2018-01-07 16:07:26)  

## 2020-07-12 NOTE — Progress Notes (Signed)
[facility] OFFICE PROGRESS NOTE  Ruth Lass, MD [address] [place] [address]  DIAGNOSIS: Essential Thrombocythemia. [name] 2 mutation positive.   PRIOR THERAPY: None  CURRENT THERAPY: 1) Hydroxyurea 500 mg p.o. daily. First dose on 07/03/20. 2) Integra plus 1 tablet p.o. daily  INTERVAL HISTORY: Ruth Sharp 69 y.o. female returns to the clinic today for a follow up visit. The patient was first seen on 06/11/20 after being referred for thrombocytosis. The patient's molecular studies show that she has the [name]2 mutation and she was diagnosed with essential thrombocythemia. She was started on hydroxyurea 500 mg p.o. daily. She has been taking this for approximately 2 weeks. Since starting this medication, she notes increased abdominal cramping, gas, and soft stool. She states she has a history of uterine cancer and is status post radiation treatment; therefore, she is sensitive to new medications. She estimates that she has about 3-4 soft stools daily, typically in the AM. She often has gas as well. She states she tried imodium without appreciable difference. She has not tried gas x. She denies any signs and symptoms of infection including fever, chills, night sweats, sore throat, nasal congestion, skin infections, or dysuria.  She denies any abnormal weight loss.  She denies any lymphadenopathy.  She denies any abnormal bleeding or bruising.  She denies any rashes or skin changes.  She denies any chest pain, shortness of breath, or cough.  She is compliant with her oral iron supplement.  The patient is here today for evaluation and repeat blood work.   MEDICAL HISTORY: Past Medical History:  Diagnosis Date  . Arthritis   . Tachycardia     ALLERGIES:  is allergic to keflex [cephalexin] and tape.  MEDICATIONS:  Current Outpatient Medications  Medication Sig Dispense Refill  . atorvastatin (LIPITOR) 20 MG tablet Take 1 tablet (20 mg total) by mouth daily. 90  tablet 3  . buPROPion (WELLBUTRIN) 75 MG tablet Take 75 mg by mouth 2 (two) times daily.    . Calcium Carbonate-Vitamin D (CALTRATE 600+D PO) Take 1 tablet by mouth daily.    Marland Kitchen FeFum-FePoly-FA-B Cmp-C-Biot (INTEGRA PLUS) CAPS Take 1 capsule by mouth every morning. 30 capsule 2  . hydroxychloroquine (PLAQUENIL) 200 MG tablet Take 2 tablets (400 mg total) by mouth daily. 90 tablet 3  . hydroxyurea (HYDREA) 500 MG capsule Take 1 capsule (500 mg total) by mouth daily. May take with food to minimize GI side effects. 30 capsule 2  . metoprolol tartrate (LOPRESSOR) 50 MG tablet TAKE 1&1/2 TABLETS BY MOUTH TWICE DAILY. 135 tablet 3  . Multiple Vitamins-Minerals (CENTRUM SILVER) CHEW Chew 1 each by mouth daily.    . RESTASIS 0.05 % ophthalmic emulsion Place 1 drop into both eyes 2 (two) times daily.     . TURMERIC PO Take by mouth.     No current facility-administered medications for this visit.    SURGICAL HISTORY:  Past Surgical History:  Procedure Laterality Date  . ABDOMINAL HYSTERECTOMY    . BREAST EXCISIONAL BIOPSY Right    benign  . knee replacements      REVIEW OF SYSTEMS:   Review of Systems  Review of Systems  Constitutional: Negative for appetite change, chills, fatigue, fever and unexpected weight change.  HENT: Negative for mouth sores, nosebleeds, sore throat and trouble swallowing.   Eyes: Negative for eye problems and icterus.  Respiratory: Negative for cough, hemoptysis, shortness of breath and wheezing.   Cardiovascular: Negative for chest pain  and leg swelling.  Gastrointestinal: Positive soft stools. Negative for abdominal pain, constipation, nausea and vomiting.  Genitourinary: Negative for bladder incontinence, difficulty urinating, dysuria, frequency and hematuria.   Musculoskeletal: Negative for back pain, gait problem, neck pain and neck stiffness.  Skin: Positive for sweating more often. Negative for itching and rash.  Neurological: Negative for dizziness,  extremity weakness, gait problem, headaches, light-headedness and seizures.  Hematological: Negative for adenopathy. Does not bruise/bleed easily.  Psychiatric/Behavioral: Negative for confusion, depression and sleep disturbance. The patient is not nervous/anxious.      PHYSICAL EXAMINATION:  Blood pressure 111/61, pulse 65, temperature 97.9 F (36.6 C), temperature source Tympanic, resp. rate 18, height 5\' 7"  (1.702 m), weight 178 lb 14.4 oz (81.1 kg), SpO2 95 %.  ECOG PERFORMANCE STATUS: 1 - Symptomatic but completely ambulatory  Physical Exam  Constitutional: Oriented to person, place, and time and well-developed, well-nourished, and in no distress.  HENT:  Head: Normocephalic and atraumatic.  Mouth/Throat: Oropharynx is clear and moist. No oropharyngeal exudate.  Eyes: Conjunctivae are normal. Right eye exhibits no discharge. Left eye exhibits no discharge. No scleral icterus.  Neck: Normal range of motion. Neck supple.  Cardiovascular: Normal rate, regular rhythm, normal heart sounds and intact distal pulses.   Pulmonary/Chest: Effort normal and breath sounds normal. No respiratory distress. No wheezes. No rales.  Abdominal: Soft. Bowel sounds are normal. Exhibits no distension and no mass. There is no tenderness.  Musculoskeletal: Normal range of motion. Exhibits no edema.  Lymphadenopathy:    No cervical adenopathy.  Neurological: Alert and oriented to person, place, and time. Exhibits normal muscle tone. Gait normal. Coordination normal.  Skin: Skin is warm and dry. No rash noted. Not diaphoretic. No erythema. No pallor.  Psychiatric: Mood, memory and judgment normal.  Vitals reviewed.  LABORATORY DATA: Lab Results  Component Value Date   WBC 7.0 07/16/2020   HGB 13.5 07/16/2020   HCT 44.7 07/16/2020   MCV 77.9 (L) 07/16/2020   PLT 576 (H) 07/16/2020      Chemistry      Component Value Date/Time   NA 140 06/11/2020 1251   NA 142 04/20/2017 1033   K 4.3 06/11/2020  1251   CL 107 06/11/2020 1251   CO2 27 06/11/2020 1251   BUN 17 06/11/2020 1251   BUN 16 04/20/2017 1033   CREATININE 1.14 (H) 06/11/2020 1251      Component Value Date/Time   CALCIUM 9.3 06/11/2020 1251   ALKPHOS 69 06/11/2020 1251   AST 31 06/11/2020 1251   ALT 21 06/11/2020 1251   BILITOT 0.9 06/11/2020 1251       RADIOGRAPHIC STUDIES:  No results found.   ASSESSMENT/PLAN:  This is a very pleasant 68 year old Caucasian female diagnosed with a central thrombocythemia.  She is [name]2 mutation positive.  She was diagnosed in October 2021.  The patient is currently being treated with hydroxyurea 500 mg p.o. daily.  She started this on 07/03/2020.  She also takes Integra +1 tablet p.o. daily.  The patient has been tolerating this well without any adverse side effects.  The patient was seen with Dr. Julien Nordmann today.  The patient had repeat lab work today which showed a platelet count decreasing from 699k 2 weeks ago to 576k. Dr. Julien Nordmann discussed that her symptoms may not be related to hydrea. He would recommend that she continue on the same dose for now. He recommends that she try lifestyle modifications including gas x, changing her dietary habits, and trying probiotics  for now. We can always see her sooner if no improvement in her symptoms.   We will see her back for follow-up visit in 4 weeks for evaluation and repeat blood work.  The patient was advised to call immediately if she has any concerning symptoms in the interval. The patient voices understanding of current disease status and treatment options and is in agreement with the current care plan. All questions were answered. The patient knows to call the clinic with any problems, questions or concerns. We can certainly see the patient much sooner if necessary    Orders Placed This Encounter  Procedures  . CBC with Differential (Cancer Center Only)    Standing Status:   Future    Standing Expiration Date:   07/16/2021  . CMP  ([facility] only)    Standing Status:   Future    Standing Expiration Date:   07/16/2021     Tobe Sos Kasmira Cacioppo, PA-C 07/16/20  ADDENDUM: Hematology/Oncology Attending: I had a face-to-face encounter with the patient today.  I recommended her care plan.  This is a very pleasant 69 years old white female recently diagnosed with essential thrombocythemia with [name]2 positive mutation.  The patient is started treatment with hydroxyurea 500 mg p.o. daily 2 weeks ago.  She is tolerating the treatment well but she has intermittent abdominal pain likely secondary to irritable bowel and gases.  She feels better after passing gases.  Her platelets count start dropping down after the treatment with hydroxyurea. I recommended for the patient to continue her current treatment with hydroxyurea with the same dose for now. We will see her back for follow-up visit in 4 weeks for evaluation and repeat blood work. For the abdominal pain we advised the patient to use Gas-X and probiotics. She was advised to call immediately if she has any other concerning symptoms in the interval.  Disclaimer: This note was dictated with voice recognition software. Similar sounding words can inadvertently be transcribed and may be missed upon review. Eilleen Kempf, MD 07/16/20

## 2020-07-16 ENCOUNTER — Inpatient Hospital Stay: Payer: Medicare HMO | Admitting: Physician Assistant

## 2020-07-16 ENCOUNTER — Other Ambulatory Visit: Payer: Self-pay

## 2020-07-16 ENCOUNTER — Inpatient Hospital Stay: Payer: Medicare HMO | Attending: Physician Assistant

## 2020-07-16 ENCOUNTER — Encounter: Payer: Self-pay | Admitting: Physician Assistant

## 2020-07-16 VITALS — BP 111/61 | HR 65 | Temp 97.9°F | Resp 18 | Ht 67.0 in | Wt 178.9 lb

## 2020-07-16 DIAGNOSIS — Z79899 Other long term (current) drug therapy: Secondary | ICD-10-CM | POA: Insufficient documentation

## 2020-07-16 DIAGNOSIS — R109 Unspecified abdominal pain: Secondary | ICD-10-CM | POA: Diagnosis not present

## 2020-07-16 DIAGNOSIS — D473 Essential (hemorrhagic) thrombocythemia: Secondary | ICD-10-CM

## 2020-07-16 LAB — CBC WITH DIFFERENTIAL (CANCER CENTER ONLY)
Abs Immature Granulocytes: 0.01 10*3/uL (ref 0.00–0.07)
Basophils Absolute: 0.1 10*3/uL (ref 0.0–0.1)
Basophils Relative: 1 %
Eosinophils Absolute: 0.4 10*3/uL (ref 0.0–0.5)
Eosinophils Relative: 5 %
HCT: 44.7 % (ref 36.0–46.0)
Hemoglobin: 13.5 g/dL (ref 12.0–15.0)
Immature Granulocytes: 0 %
Lymphocytes Relative: 16 %
Lymphs Abs: 1.1 10*3/uL (ref 0.7–4.0)
MCH: 23.5 pg — ABNORMAL LOW (ref 26.0–34.0)
MCHC: 30.2 g/dL (ref 30.0–36.0)
MCV: 77.9 fL — ABNORMAL LOW (ref 80.0–100.0)
Monocytes Absolute: 0.4 10*3/uL (ref 0.1–1.0)
Monocytes Relative: 6 %
Neutro Abs: 5 10*3/uL (ref 1.7–7.7)
Neutrophils Relative %: 72 %
Platelet Count: 576 10*3/uL — ABNORMAL HIGH (ref 150–400)
RBC: 5.74 MIL/uL — ABNORMAL HIGH (ref 3.87–5.11)
RDW: 21.2 % — ABNORMAL HIGH (ref 11.5–15.5)
WBC Count: 7 10*3/uL (ref 4.0–10.5)
nRBC: 0 % (ref 0.0–0.2)

## 2020-08-05 DIAGNOSIS — E7800 Pure hypercholesterolemia, unspecified: Secondary | ICD-10-CM | POA: Diagnosis not present

## 2020-08-05 LAB — LIPID PANEL
Chol/HDL Ratio: 1.9 ratio (ref 0.0–4.4)
Cholesterol, Total: 120 mg/dL (ref 100–199)
HDL: 64 mg/dL (ref >39)
LDL Chol Calc (NIH): 42 mg/dL (ref 0–99)
Triglycerides: 70 mg/dL (ref 0–149)
VLDL Cholesterol Cal: 14 mg/dL (ref 5–40)

## 2020-08-10 NOTE — Progress Notes (Signed)
[place] OFFICE PROGRESS NOTE  Ruth Lass, MD [address] [place] [address]  DIAGNOSIS: Essential Thrombocythemia. [name] 2 mutation positive.  PRIOR THERAPY: None  CURRENT THERAPY:  1) Hydroxyurea 500 mg p.o. daily. First dose on 07/03/20. 2) Integra plus 1 tablet p.o. daily  INTERVAL HISTORY: Ruth Sharp 69 y.o. female returns to the clinic today for a follow up visit. The patient was first seen on 06/11/20 after being referred for thrombocytosis. The patient's molecular studies show that she has the [name]2 mutation and she was diagnosed with essential thrombocythemia. She was started on hydroxyurea 500 mg p.o. daily. She has been taking this for approximately 6 weeks. At her 2 week follow up appointment, she had been endorsing gas, abdominal cramping, and soft stools since starting this medication. She states her symptoms have "calmed down". She deneis any more diarrhea. She states that certain foods exacerbate her abdominal discomfort but she denies abdominal cramping or pain. She describes it as feeling unwell in her stomach. Gasx did not work for her and she is inquiring about what else she can take. She also mentioned at the ebd of her appointment that she is having mucus secretions from her anus which she states started after taking the hydrea. She had a cologuard test in the last year or so which was reportedly normal. She has not seen a gastroenterologist before. She has lost about 6 lbs since her last appointment due to decreased appetite. She did start taking Wellbutrin in the last few months.  She denies any signs and symptoms of infection including fever, chills, night sweats, sore throat, nasal congestion, skin infections, or dysuria. She denies any lymphadenopathy.  She denies any abnormal bleeding or bruising.  She denies any rashes or skin changes.  She denies any chest pain, shortness of breath, or cough.  She is compliant with her oral iron  supplement.  The patient is here today for evaluation and repeat blood work.    MEDICAL HISTORY: Past Medical History:  Diagnosis Date  . Arthritis   . Tachycardia     ALLERGIES:  is allergic to keflex [cephalexin] and tape.  MEDICATIONS:  Current Outpatient Medications  Medication Sig Dispense Refill  . atorvastatin (LIPITOR) 20 MG tablet Take 1 tablet (20 mg total) by mouth daily. 90 tablet 3  . buPROPion (WELLBUTRIN) 75 MG tablet Take 75 mg by mouth 2 (two) times daily.    . Calcium Carbonate-Vitamin D (CALTRATE 600+D PO) Take 1 tablet by mouth daily.    Marland Kitchen FeFum-FePoly-FA-B Cmp-C-Biot (INTEGRA PLUS) CAPS Take 1 capsule by mouth every morning. 30 capsule 2  . hydroxychloroquine (PLAQUENIL) 200 MG tablet Take 2 tablets (400 mg total) by mouth daily. 90 tablet 3  . hydroxyurea (HYDREA) 500 MG capsule Take 1 capsule (500 mg total) by mouth daily. May take with food to minimize GI side effects. 30 capsule 2  . metoprolol tartrate (LOPRESSOR) 50 MG tablet TAKE 1&1/2 TABLETS BY MOUTH TWICE DAILY. 135 tablet 3  . Multiple Vitamins-Minerals (CENTRUM SILVER) CHEW Chew 1 each by mouth daily.    . RESTASIS 0.05 % ophthalmic emulsion Place 1 drop into both eyes 2 (two) times daily.     . TURMERIC PO Take by mouth.     No current facility-administered medications for this visit.    SURGICAL HISTORY:  Past Surgical History:  Procedure Laterality Date  . ABDOMINAL HYSTERECTOMY    . BREAST EXCISIONAL BIOPSY Right    benign  .  knee replacements      REVIEW OF SYSTEMS:   Review of Systems  Constitutional: Negative for appetite change, chills, fatigue, fever and unexpected weight change.  HENT:   Negative for mouth sores, nosebleeds, sore throat and trouble swallowing.   Eyes: Negative for eye problems and icterus.  Respiratory: Negative for cough, hemoptysis, shortness of breath and wheezing.   Cardiovascular: Negative for chest pain and leg swelling.  Gastrointestinal: Negative for  abdominal pain, constipation, diarrhea, nausea and vomiting.  Genitourinary: Negative for bladder incontinence, difficulty urinating, dysuria, frequency and hematuria.   Musculoskeletal: Negative for back pain, gait problem, neck pain and neck stiffness.  Skin: Negative for itching and rash.  Neurological: Negative for dizziness, extremity weakness, gait problem, headaches, light-headedness and seizures.  Hematological: Negative for adenopathy. Does not bruise/bleed easily.  Psychiatric/Behavioral: Negative for confusion, depression and sleep disturbance. The patient is not nervous/anxious.     PHYSICAL EXAMINATION:  There were no vitals taken for this visit.  ECOG PERFORMANCE STATUS: 1 - Symptomatic but completely ambulatory  Physical Exam  Constitutional: Oriented to person, place, and time and well-developed, well-nourished, and in no distress. No distress.  HENT:  Head: Normocephalic and atraumatic.  Mouth/Throat: Oropharynx is clear and moist. No oropharyngeal exudate.  Eyes: Conjunctivae are normal. Right eye exhibits no discharge. Left eye exhibits no discharge. No scleral icterus.  Neck: Normal range of motion. Neck supple.  Cardiovascular: Normal rate, regular rhythm, normal heart sounds and intact distal pulses.   Pulmonary/Chest: Effort normal and breath sounds normal. No respiratory distress. No wheezes. No rales.  Abdominal: Soft. Bowel sounds are normal. Exhibits no distension and no mass. There is no tenderness.  Musculoskeletal: Normal range of motion. Exhibits no edema.  Lymphadenopathy:    No cervical adenopathy.  Neurological: Alert and oriented to person, place, and time. Exhibits normal muscle tone. Gait normal. Coordination normal.  Skin: Skin is warm and dry. No rash noted. Not diaphoretic. No erythema. No pallor.  Psychiatric: Mood, memory and judgment normal.  Vitals reviewed.  LABORATORY DATA: Lab Results  Component Value Date   WBC 7.0 07/16/2020   HGB  13.5 07/16/2020   HCT 44.7 07/16/2020   MCV 77.9 (L) 07/16/2020   PLT 576 (H) 07/16/2020      Chemistry      Component Value Date/Time   NA 140 06/11/2020 1251   NA 142 04/20/2017 1033   K 4.3 06/11/2020 1251   CL 107 06/11/2020 1251   CO2 27 06/11/2020 1251   BUN 17 06/11/2020 1251   BUN 16 04/20/2017 1033   CREATININE 1.14 (H) 06/11/2020 1251      Component Value Date/Time   CALCIUM 9.3 06/11/2020 1251   ALKPHOS 69 06/11/2020 1251   AST 31 06/11/2020 1251   ALT 21 06/11/2020 1251   BILITOT 0.9 06/11/2020 1251       RADIOGRAPHIC STUDIES:  No results found.   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  She was diagnosed in October 2021.  The patient is currently being treated with hydroxyurea 500 mg p.o. daily.  She started this on 07/03/2020. She reports her GI symptoms have "calmed down". She has several other concerns since starting hydrea which are not typical side effects. She also takes Integra +1 tablet p.o. daily.  The patient has been tolerating this well without any adverse side effects.  The patient was seen with Dr. Julien Nordmann today.  The patient had repeat lab work today which showed a platelet count decreasing from 576k 2 weeks ago to 517k. Dr. Julien Nordmann recommends that she continue on the same treatment at the same dose. Dr. Julien Nordmann recommends that she take this medication at night to avoid any unwanted symptoms. Dr. Julien Nordmann also would like her to take a 81 mg aspirin.   Regarding her GI upset with certain foods, such as salads, our pharmacy staff recommended keeping a food diary to see what foods provoke her symptoms. Gasx has not helped her in the past. They recommended that she take beano. Regarding her GI complaints and mucus secretion from the anus, Dr. Julien Nordmann recommended monitoring for now. However, if no improvement, we will consider a referral to GI.  We  will see her back for a follow up visit in 4 weeks for evaluation and repeat blood work. We will recheck her iron studies at that time as well.   Per pharmacy staff, discussed with the patient that decreased appetite and weight loss is not a typical side effect of hydrea and is more likely related to her recent Wellbutrin prescription which is fairly new for her. Recommend following up with prescribing provider regarding alternative options.   The patient was advised to call immediately if she has any concerning symptoms in the interval. The patient voices understanding of current disease status and treatment options and is in agreement with the current care plan. All questions were answered. The patient knows to call the clinic with any problems, questions or concerns. We can certainly see the patient much sooner if necessary   No orders of the defined types were placed in this encounter.    Veniamin Kincaid L Ajax Schroll, PA-C 08/10/20  ADDENDUM: Hematology/Oncology Attending: I had a face-to-face encounter with the patient today. I recommended her care plan. This is a very pleasant 69 years old white female recently diagnosed with essential thrombocythemia with positive [name]2 mutation. She is currently on treatment with hydroxyurea 500 mg p.o. daily started 6 weeks ago. The patient has been tolerating her treatment well except for upset stomach, bloating as well as soft stools started with the treatment. Her symptoms has improved over the last 1-2 weeks. CBC today showed improvement of her platelets count. I recommended for the patient to continue on hydroxyurea with the same dose for now. We will see her back for follow-up visit in 1 months for evaluation and repeat blood work. She was advised to call immediately if she has any other concerning symptoms in the interval.  Disclaimer: This note was dictated with voice recognition software. Similar sounding words can inadvertently be transcribed  and may be missed upon review. Eilleen Kempf, MD 08/13/20

## 2020-08-13 ENCOUNTER — Other Ambulatory Visit: Payer: Self-pay

## 2020-08-13 ENCOUNTER — Inpatient Hospital Stay: Payer: Medicare HMO | Admitting: Physician Assistant

## 2020-08-13 ENCOUNTER — Inpatient Hospital Stay: Payer: Medicare HMO

## 2020-08-13 VITALS — BP 128/69 | HR 57 | Temp 97.2°F | Resp 16 | Ht 67.0 in | Wt 172.3 lb

## 2020-08-13 DIAGNOSIS — D473 Essential (hemorrhagic) thrombocythemia: Secondary | ICD-10-CM

## 2020-08-13 DIAGNOSIS — D509 Iron deficiency anemia, unspecified: Secondary | ICD-10-CM | POA: Insufficient documentation

## 2020-08-13 LAB — CBC WITH DIFFERENTIAL (CANCER CENTER ONLY)
Abs Immature Granulocytes: 0.01 10*3/uL (ref 0.00–0.07)
Basophils Absolute: 0.1 10*3/uL (ref 0.0–0.1)
Basophils Relative: 2 %
Eosinophils Absolute: 0.2 10*3/uL (ref 0.0–0.5)
Eosinophils Relative: 3 %
HCT: 43.9 % (ref 36.0–46.0)
Hemoglobin: 13.5 g/dL (ref 12.0–15.0)
Immature Granulocytes: 0 %
Lymphocytes Relative: 18 %
Lymphs Abs: 1.1 10*3/uL (ref 0.7–4.0)
MCH: 24.7 pg — ABNORMAL LOW (ref 26.0–34.0)
MCHC: 30.8 g/dL (ref 30.0–36.0)
MCV: 80.4 fL (ref 80.0–100.0)
Monocytes Absolute: 0.4 10*3/uL (ref 0.1–1.0)
Monocytes Relative: 8 %
Neutro Abs: 4 10*3/uL (ref 1.7–7.7)
Neutrophils Relative %: 69 %
Platelet Count: 516 10*3/uL — ABNORMAL HIGH (ref 150–400)
RBC: 5.46 MIL/uL — ABNORMAL HIGH (ref 3.87–5.11)
RDW: 21.8 % — ABNORMAL HIGH (ref 11.5–15.5)
WBC Count: 5.7 10*3/uL (ref 4.0–10.5)
nRBC: 0 % (ref 0.0–0.2)

## 2020-08-13 LAB — CMP (CANCER CENTER ONLY)
ALT: 19 U/L (ref 0–44)
AST: 31 U/L (ref 15–41)
Albumin: 4 g/dL (ref 3.5–5.0)
Alkaline Phosphatase: 66 U/L (ref 38–126)
Anion gap: 8 (ref 5–15)
BUN: 16 mg/dL (ref 8–23)
CO2: 25 mmol/L (ref 22–32)
Calcium: 9.4 mg/dL (ref 8.9–10.3)
Chloride: 109 mmol/L (ref 98–111)
Creatinine: 1.07 mg/dL — ABNORMAL HIGH (ref 0.44–1.00)
GFR, Estimated: 56 mL/min — ABNORMAL LOW (ref >60)
Glucose, Bld: 97 mg/dL (ref 70–99)
Potassium: 4.3 mmol/L (ref 3.5–5.1)
Sodium: 142 mmol/L (ref 135–145)
Total Bilirubin: 1 mg/dL (ref 0.3–1.2)
Total Protein: 6.9 g/dL (ref 6.5–8.1)

## 2020-08-20 DIAGNOSIS — Z23 Encounter for immunization: Secondary | ICD-10-CM | POA: Diagnosis not present

## 2020-08-20 DIAGNOSIS — Z1211 Encounter for screening for malignant neoplasm of colon: Secondary | ICD-10-CM | POA: Diagnosis not present

## 2020-08-20 DIAGNOSIS — Z0000 Encounter for general adult medical examination without abnormal findings: Secondary | ICD-10-CM | POA: Diagnosis not present

## 2020-08-20 DIAGNOSIS — Z1389 Encounter for screening for other disorder: Secondary | ICD-10-CM | POA: Diagnosis not present

## 2020-08-26 DIAGNOSIS — Z6825 Body mass index (BMI) 25.0-25.9, adult: Secondary | ICD-10-CM | POA: Diagnosis not present

## 2020-08-26 DIAGNOSIS — R69 Illness, unspecified: Secondary | ICD-10-CM | POA: Diagnosis not present

## 2020-08-27 DIAGNOSIS — L821 Other seborrheic keratosis: Secondary | ICD-10-CM | POA: Diagnosis not present

## 2020-08-27 DIAGNOSIS — D225 Melanocytic nevi of trunk: Secondary | ICD-10-CM | POA: Diagnosis not present

## 2020-08-27 DIAGNOSIS — L570 Actinic keratosis: Secondary | ICD-10-CM | POA: Diagnosis not present

## 2020-08-27 DIAGNOSIS — D1801 Hemangioma of skin and subcutaneous tissue: Secondary | ICD-10-CM | POA: Diagnosis not present

## 2020-08-27 DIAGNOSIS — L578 Other skin changes due to chronic exposure to nonionizing radiation: Secondary | ICD-10-CM | POA: Diagnosis not present

## 2020-08-27 DIAGNOSIS — Z872 Personal history of diseases of the skin and subcutaneous tissue: Secondary | ICD-10-CM | POA: Diagnosis not present

## 2020-08-27 DIAGNOSIS — D2272 Melanocytic nevi of left lower limb, including hip: Secondary | ICD-10-CM | POA: Diagnosis not present

## 2020-09-17 ENCOUNTER — Telehealth: Payer: Self-pay | Admitting: Internal Medicine

## 2020-09-17 ENCOUNTER — Other Ambulatory Visit: Payer: Self-pay

## 2020-09-17 ENCOUNTER — Inpatient Hospital Stay: Payer: Medicare HMO | Admitting: Internal Medicine

## 2020-09-17 ENCOUNTER — Inpatient Hospital Stay: Payer: Medicare HMO | Attending: Physician Assistant

## 2020-09-17 ENCOUNTER — Telehealth: Payer: Self-pay | Admitting: Medical Oncology

## 2020-09-17 VITALS — BP 115/74 | HR 61 | Temp 97.7°F | Resp 18 | Ht 67.0 in | Wt 171.4 lb

## 2020-09-17 DIAGNOSIS — D473 Essential (hemorrhagic) thrombocythemia: Secondary | ICD-10-CM

## 2020-09-17 DIAGNOSIS — D508 Other iron deficiency anemias: Secondary | ICD-10-CM

## 2020-09-17 DIAGNOSIS — D509 Iron deficiency anemia, unspecified: Secondary | ICD-10-CM | POA: Insufficient documentation

## 2020-09-17 DIAGNOSIS — Z79899 Other long term (current) drug therapy: Secondary | ICD-10-CM | POA: Insufficient documentation

## 2020-09-17 LAB — CMP (CANCER CENTER ONLY)
ALT: 31 U/L (ref 0–44)
AST: 36 U/L (ref 15–41)
Albumin: 3.9 g/dL (ref 3.5–5.0)
Alkaline Phosphatase: 84 U/L (ref 38–126)
Anion gap: 6 (ref 5–15)
BUN: 14 mg/dL (ref 8–23)
CO2: 27 mmol/L (ref 22–32)
Calcium: 9 mg/dL (ref 8.9–10.3)
Chloride: 109 mmol/L (ref 98–111)
Creatinine: 0.99 mg/dL (ref 0.44–1.00)
GFR, Estimated: >60 mL/min (ref >60)
Glucose, Bld: 86 mg/dL (ref 70–99)
Potassium: 4.2 mmol/L (ref 3.5–5.1)
Sodium: 142 mmol/L (ref 135–145)
Total Bilirubin: 0.8 mg/dL (ref 0.3–1.2)
Total Protein: 6.9 g/dL (ref 6.5–8.1)

## 2020-09-17 LAB — CBC WITH DIFFERENTIAL (CANCER CENTER ONLY)
Abs Immature Granulocytes: 0.02 10*3/uL (ref 0.00–0.07)
Basophils Absolute: 0.1 10*3/uL (ref 0.0–0.1)
Basophils Relative: 1 %
Eosinophils Absolute: 0.2 10*3/uL (ref 0.0–0.5)
Eosinophils Relative: 3 %
HCT: 44.6 % (ref 36.0–46.0)
Hemoglobin: 13.9 g/dL (ref 12.0–15.0)
Immature Granulocytes: 0 %
Lymphocytes Relative: 18 %
Lymphs Abs: 1.1 10*3/uL (ref 0.7–4.0)
MCH: 25.8 pg — ABNORMAL LOW (ref 26.0–34.0)
MCHC: 31.2 g/dL (ref 30.0–36.0)
MCV: 82.9 fL (ref 80.0–100.0)
Monocytes Absolute: 0.5 10*3/uL (ref 0.1–1.0)
Monocytes Relative: 8 %
Neutro Abs: 4.3 10*3/uL (ref 1.7–7.7)
Neutrophils Relative %: 70 %
Platelet Count: 501 10*3/uL — ABNORMAL HIGH (ref 150–400)
RBC: 5.38 MIL/uL — ABNORMAL HIGH (ref 3.87–5.11)
RDW: 22.1 % — ABNORMAL HIGH (ref 11.5–15.5)
WBC Count: 6.1 10*3/uL (ref 4.0–10.5)
nRBC: 0 % (ref 0.0–0.2)

## 2020-09-17 LAB — FERRITIN: Ferritin: 59 ng/mL (ref 11–307)

## 2020-09-17 LAB — IRON AND TIBC
Iron: 108 ug/dL (ref 41–142)
Saturation Ratios: 30 % (ref 21–57)
TIBC: 365 ug/dL (ref 236–444)
UIBC: 257 ug/dL (ref 120–384)

## 2020-09-17 NOTE — Telephone Encounter (Signed)
Scheduled appts per 1/4 los. Gave pt a print out of AVS.

## 2020-09-17 NOTE — Progress Notes (Signed)
[facility] [place] [phone]   [phone]  OFFICE PROGRESS NOTE  [name], MD [address] [place] [address]  DIAGNOSIS: Essential Thrombocythemia. [name] 2 mutation positive.  PRIOR THERAPY: None  CURRENT THERAPY:  1) Hydroxyurea 500 mg p.o. daily. First dose on 07/03/20. 2) Integra plus 1 tablet p.o. daily   INTERVAL HISTORY: Ruth Sharp 70 y.o. female returns to the clinic today for follow-up visit.  The patient is feeling fine today with no concerning complaints.  She mentions that she started feeling much better few weeks ago.  She denied having any current chest pain, shortness of breath, cough or hemoptysis.  She denied having any fever or chills.  She has no nausea, vomiting, diarrhea or constipation.  She continues to tolerate her treatment with hydroxyurea and Integra plus fairly well.  The patient is here today for evaluation and repeat blood work.  MEDICAL HISTORY: Past Medical History:  Diagnosis Date  . Arthritis   . Tachycardia     ALLERGIES:  is allergic to keflex [cephalexin] and tape.  MEDICATIONS:  Current Outpatient Medications  Medication Sig Dispense Refill  . atorvastatin (LIPITOR) 20 MG tablet Take 1 tablet (20 mg total) by mouth daily. 90 tablet 3  . Bacillus Coagulans-Inulin (PROBIOTIC) 1-250 BILLION-MG CAPS Take 1 capsule by mouth daily. Pt states uses [employer] brand    . buPROPion (WELLBUTRIN) 75 MG tablet Take 75 mg by mouth 2 (two) times daily.    . Calcium Carbonate-Vitamin D (CALTRATE 600+D PO) Take 1 tablet by mouth daily.    Marland Kitchen FeFum-FePoly-FA-B Cmp-C-Biot (INTEGRA PLUS) CAPS Take 1 capsule by mouth every morning. 30 capsule 2  . hydroxychloroquine (PLAQUENIL) 200 MG tablet Take 2 tablets (400 mg total) by mouth daily. 90 tablet 3  . hydroxyurea (HYDREA) 500 MG capsule Take 1 capsule (500 mg total) by mouth daily. May take with food to minimize GI side effects. 30 capsule 2  .  metoprolol tartrate (LOPRESSOR) 50 MG tablet TAKE 1&1/2 TABLETS BY MOUTH TWICE DAILY. 135 tablet 3  . Multiple Vitamins-Minerals (CENTRUM SILVER) CHEW Chew 1 each by mouth daily.    . RESTASIS 0.05 % ophthalmic emulsion Place 1 drop into both eyes 2 (two) times daily.     . TURMERIC PO Take by mouth.     No current facility-administered medications for this visit.    SURGICAL HISTORY:  Past Surgical History:  Procedure Laterality Date  . ABDOMINAL HYSTERECTOMY    . BREAST EXCISIONAL BIOPSY Right    benign  . knee replacements      REVIEW OF SYSTEMS:  A comprehensive review of systems was negative.   PHYSICAL EXAMINATION: General appearance: alert, cooperative and no distress Head: Normocephalic, without obvious abnormality, atraumatic Neck: no adenopathy, no JVD, supple, symmetrical, trachea midline and thyroid not enlarged, symmetric, no tenderness/mass/nodules Lymph nodes: Cervical, supraclavicular, and axillary nodes normal. Resp: clear to auscultation bilaterally Back: symmetric, no curvature. ROM normal. No CVA tenderness. Cardio: regular rate and rhythm, S1, S2 normal, no murmur, click, rub or gallop GI: soft, non-tender; bowel sounds normal; no masses,  no organomegaly Extremities: extremities normal, atraumatic, no cyanosis or edema  ECOG PERFORMANCE STATUS: 1 - Symptomatic but completely ambulatory  Blood pressure 115/74, pulse 61, temperature 97.7 F (36.5 C), temperature source Tympanic, resp. rate 18, height 5\' 7"  (1.702 m), weight 171 lb 6.4 oz (77.7 kg), SpO2 100 %.  LABORATORY DATA: Lab Results  Component Value Date  WBC 5.7 08/13/2020   HGB 13.5 08/13/2020   HCT 43.9 08/13/2020   MCV 80.4 08/13/2020   PLT 516 (H) 08/13/2020      Chemistry      Component Value Date/Time   NA 142 08/13/2020 1036   NA 142 04/20/2017 1033   K 4.3 08/13/2020 1036   CL 109 08/13/2020 1036   CO2 25 08/13/2020 1036   BUN 16 08/13/2020 1036   BUN 16 04/20/2017 1033    CREATININE 1.07 (H) 08/13/2020 1036      Component Value Date/Time   CALCIUM 9.4 08/13/2020 1036   ALKPHOS 66 08/13/2020 1036   AST 31 08/13/2020 1036   ALT 19 08/13/2020 1036   BILITOT 1.0 08/13/2020 1036       RADIOGRAPHIC STUDIES: No results found.  ASSESSMENT AND PLAN: This is a very pleasant 70 years old white female recently diagnosed with essential thrombocythemia as well as iron deficiency anemia.  The patient is currently on treatment with hydroxyurea 500 mg p.o. daily in addition to Integra +1 capsule p.o. daily. He has been tolerating this treatment well with no concerning adverse effects. Her CBC today showed normal hemoglobin and hematocrit.  Her platelets count are down to 501,000. I recommended for the patient to continue on the same dose of hydroxyurea for now. I will see her back for follow-up visit in 1 months for evaluation and repeat blood work. She was advised to call immediately if she has any concerning symptoms in the interval. The patient voices understanding of current disease status and treatment options and is in agreement with the current care plan.  All questions were answered. The patient knows to call the clinic with any problems, questions or concerns. We can certainly see the patient much sooner if necessary.  Disclaimer: This note was dictated with voice recognition software. Similar sounding words can inadvertently be transcribed and may not be corrected upon review.

## 2020-09-17 NOTE — Telephone Encounter (Signed)
Pt lost keys this am , so she will be late getting here.

## 2020-09-23 ENCOUNTER — Other Ambulatory Visit: Payer: Self-pay | Admitting: Physician Assistant

## 2020-09-23 DIAGNOSIS — D75839 Thrombocytosis, unspecified: Secondary | ICD-10-CM

## 2020-09-23 DIAGNOSIS — D473 Essential (hemorrhagic) thrombocythemia: Secondary | ICD-10-CM

## 2020-10-22 ENCOUNTER — Telehealth: Payer: Self-pay | Admitting: Internal Medicine

## 2020-10-22 ENCOUNTER — Inpatient Hospital Stay: Payer: Medicare HMO | Attending: Physician Assistant | Admitting: Internal Medicine

## 2020-10-22 ENCOUNTER — Other Ambulatory Visit: Payer: Self-pay

## 2020-10-22 ENCOUNTER — Inpatient Hospital Stay: Payer: Medicare HMO

## 2020-10-22 VITALS — BP 111/61 | HR 65 | Temp 97.6°F | Resp 12 | Ht 67.0 in | Wt 170.7 lb

## 2020-10-22 DIAGNOSIS — D509 Iron deficiency anemia, unspecified: Secondary | ICD-10-CM | POA: Insufficient documentation

## 2020-10-22 DIAGNOSIS — D473 Essential (hemorrhagic) thrombocythemia: Secondary | ICD-10-CM | POA: Diagnosis not present

## 2020-10-22 DIAGNOSIS — Z79899 Other long term (current) drug therapy: Secondary | ICD-10-CM | POA: Diagnosis not present

## 2020-10-22 LAB — CBC WITH DIFFERENTIAL (CANCER CENTER ONLY)
Abs Immature Granulocytes: 0.01 10*3/uL (ref 0.00–0.07)
Basophils Absolute: 0.1 10*3/uL (ref 0.0–0.1)
Basophils Relative: 1 %
Eosinophils Absolute: 0.2 10*3/uL (ref 0.0–0.5)
Eosinophils Relative: 3 %
HCT: 44.3 % (ref 36.0–46.0)
Hemoglobin: 14 g/dL (ref 12.0–15.0)
Immature Granulocytes: 0 %
Lymphocytes Relative: 20 %
Lymphs Abs: 1.3 10*3/uL (ref 0.7–4.0)
MCH: 27 pg (ref 26.0–34.0)
MCHC: 31.6 g/dL (ref 30.0–36.0)
MCV: 85.5 fL (ref 80.0–100.0)
Monocytes Absolute: 0.5 10*3/uL (ref 0.1–1.0)
Monocytes Relative: 9 %
Neutro Abs: 4.2 10*3/uL (ref 1.7–7.7)
Neutrophils Relative %: 67 %
Platelet Count: 552 10*3/uL — ABNORMAL HIGH (ref 150–400)
RBC: 5.18 MIL/uL — ABNORMAL HIGH (ref 3.87–5.11)
RDW: 19.5 % — ABNORMAL HIGH (ref 11.5–15.5)
WBC Count: 6.2 10*3/uL (ref 4.0–10.5)
nRBC: 0 % (ref 0.0–0.2)

## 2020-10-22 LAB — CMP (CANCER CENTER ONLY)
ALT: 31 U/L (ref 0–44)
AST: 38 U/L (ref 15–41)
Albumin: 4.2 g/dL (ref 3.5–5.0)
Alkaline Phosphatase: 78 U/L (ref 38–126)
Anion gap: 6 (ref 5–15)
BUN: 16 mg/dL (ref 8–23)
CO2: 27 mmol/L (ref 22–32)
Calcium: 9.2 mg/dL (ref 8.9–10.3)
Chloride: 108 mmol/L (ref 98–111)
Creatinine: 1.05 mg/dL — ABNORMAL HIGH (ref 0.44–1.00)
GFR, Estimated: 57 mL/min — ABNORMAL LOW (ref >60)
Glucose, Bld: 101 mg/dL — ABNORMAL HIGH (ref 70–99)
Potassium: 4.6 mmol/L (ref 3.5–5.1)
Sodium: 141 mmol/L (ref 135–145)
Total Bilirubin: 1 mg/dL (ref 0.3–1.2)
Total Protein: 7 g/dL (ref 6.5–8.1)

## 2020-10-22 LAB — LACTATE DEHYDROGENASE: LDH: 273 U/L — ABNORMAL HIGH (ref 98–192)

## 2020-10-22 MED ORDER — PROCHLORPERAZINE MALEATE 10 MG PO TABS
10.0000 mg | ORAL_TABLET | Freq: Four times a day (QID) | ORAL | 0 refills | Status: DC | PRN
Start: 2020-10-22 — End: 2023-01-13

## 2020-10-22 NOTE — Progress Notes (Signed)
[facility] [phone]   [phone]  OFFICE PROGRESS NOTE  [name], MD [address] [place] [address]  DIAGNOSIS: Essential Thrombocythemia. [name] 2 mutation positive.  PRIOR THERAPY: None  CURRENT THERAPY:  1) Hydroxyurea 500 mg p.o. alternating with 1000 mg every other day. First dose on 07/03/20. 2) Integra plus 1 tablet p.o. daily   INTERVAL HISTORY: Ruth Sharp 70 y.o. female returns to the clinic today for follow-up visit.  The patient is feeling fine today with no concerning complaints except for occasional nausea after taking hydroxyurea at nighttime.  She denied having any vomiting, diarrhea or constipation.  She has no chest pain, shortness of breath, cough or hemoptysis.  She has no weight loss or night sweats.  She is here today for evaluation and repeat blood work.  MEDICAL HISTORY: Past Medical History:  Diagnosis Date  . Arthritis   . Tachycardia     ALLERGIES:  is allergic to keflex [cephalexin] and tape.  MEDICATIONS:  Current Outpatient Medications  Medication Sig Dispense Refill  . atorvastatin (LIPITOR) 20 MG tablet Take 1 tablet (20 mg total) by mouth daily. 90 tablet 3  . Bacillus Coagulans-Inulin (PROBIOTIC) 1-250 BILLION-MG CAPS Take 1 capsule by mouth daily. Pt states uses [employer] brand    . buPROPion (WELLBUTRIN) 75 MG tablet Take 75 mg by mouth 2 (two) times daily.    . Calcium Carbonate-Vitamin D (CALTRATE 600+D PO) Take 1 tablet by mouth daily.    Marland Kitchen FeFum-FePoly-FA-B Cmp-C-Biot (FOLIVANE-PLUS) CAPS TAKE 1 CAPSULE BY MOUTH EVERY DAY IN THE MORNING 90 capsule 1  . hydroxychloroquine (PLAQUENIL) 200 MG tablet Take 2 tablets (400 mg total) by mouth daily. 90 tablet 3  . hydroxyurea (HYDREA) 500 MG capsule TAKE 1 CAPSULE (500 MG TOTAL) BY MOUTH DAILY. MAY TAKE WITH FOOD TO MINIMIZE GI SIDE EFFECTS. 90 capsule 1  . metoprolol tartrate (LOPRESSOR) 50 MG tablet TAKE 1&1/2 TABLETS BY MOUTH  TWICE DAILY. 135 tablet 3  . Multiple Vitamins-Minerals (CENTRUM SILVER) CHEW Chew 1 each by mouth daily.    . RESTASIS 0.05 % ophthalmic emulsion Place 1 drop into both eyes 2 (two) times daily.     . TURMERIC PO Take by mouth.     No current facility-administered medications for this visit.    SURGICAL HISTORY:  Past Surgical History:  Procedure Laterality Date  . ABDOMINAL HYSTERECTOMY    . BREAST EXCISIONAL BIOPSY Right    benign  . knee replacements      REVIEW OF SYSTEMS:  A comprehensive review of systems was negative except for: Gastrointestinal: positive for nausea   PHYSICAL EXAMINATION: General appearance: alert, cooperative and no distress Head: Normocephalic, without obvious abnormality, atraumatic Neck: no adenopathy, no JVD, supple, symmetrical, trachea midline and thyroid not enlarged, symmetric, no tenderness/mass/nodules Lymph nodes: Cervical, supraclavicular, and axillary nodes normal. Resp: clear to auscultation bilaterally Back: symmetric, no curvature. ROM normal. No CVA tenderness. Cardio: regular rate and rhythm, S1, S2 normal, no murmur, click, rub or gallop GI: soft, non-tender; bowel sounds normal; no masses,  no organomegaly Extremities: extremities normal, atraumatic, no cyanosis or edema  ECOG PERFORMANCE STATUS: 1 - Symptomatic but completely ambulatory  Blood pressure 111/61, pulse 65, temperature 97.6 F (36.4 C), temperature source Tympanic, resp. rate 12, height 5\' 7"  (1.702 m), weight 170 lb 11.2 oz (77.4 kg), SpO2 99 %.  LABORATORY DATA: Lab Results  Component Value Date   WBC 6.2 10/22/2020  HGB 14.0 10/22/2020   HCT 44.3 10/22/2020   MCV 85.5 10/22/2020   PLT 552 (H) 10/22/2020      Chemistry      Component Value Date/Time   NA 142 09/17/2020 1116   NA 142 04/20/2017 1033   K 4.2 09/17/2020 1116   CL 109 09/17/2020 1116   CO2 27 09/17/2020 1116   BUN 14 09/17/2020 1116   BUN 16 04/20/2017 1033   CREATININE 0.99 09/17/2020  1116      Component Value Date/Time   CALCIUM 9.0 09/17/2020 1116   ALKPHOS 84 09/17/2020 1116   AST 36 09/17/2020 1116   ALT 31 09/17/2020 1116   BILITOT 0.8 09/17/2020 1116       RADIOGRAPHIC STUDIES: No results found.  ASSESSMENT AND PLAN: This is a very pleasant 70 years old white female recently diagnosed with essential thrombocythemia as well as iron deficiency anemia.  The patient is currently on treatment with hydroxyurea 500 mg p.o. daily in addition to Integra +1 capsule p.o. daily. The patient has been tolerating her treatment with hydroxyurea fairly well except for occasional nausea at nighttime. Repeat CBC today showed increase in her platelets count to 552,000. I recommended for the patient to increase her dose of hydroxyurea to 1000 mg p.o. alternating with 500 mg every other day. For the nausea I will send prescription for Compazine to her pharmacy. I will see the patient back for follow-up visit in 1 months for evaluation and repeat blood work. She was advised to call immediately if she has any concerning symptoms in the interval. The patient voices understanding of current disease status and treatment options and is in agreement with the current care plan.  All questions were answered. The patient knows to call the clinic with any problems, questions or concerns. We can certainly see the patient much sooner if necessary.  Disclaimer: This note was dictated with voice recognition software. Similar sounding words can inadvertently be transcribed and may not be corrected upon review.

## 2020-10-22 NOTE — Telephone Encounter (Signed)
Scheduled appointment per 2/8 los. Spoke to patient who is aware of appointments date and times.

## 2020-11-19 ENCOUNTER — Telehealth: Payer: Self-pay | Admitting: Internal Medicine

## 2020-11-19 ENCOUNTER — Inpatient Hospital Stay: Payer: Medicare HMO | Admitting: Internal Medicine

## 2020-11-19 ENCOUNTER — Other Ambulatory Visit: Payer: Self-pay

## 2020-11-19 ENCOUNTER — Inpatient Hospital Stay: Payer: Medicare HMO | Attending: Internal Medicine

## 2020-11-19 VITALS — BP 108/62 | HR 63 | Temp 97.9°F | Resp 15 | Ht 67.0 in | Wt 170.5 lb

## 2020-11-19 DIAGNOSIS — M069 Rheumatoid arthritis, unspecified: Secondary | ICD-10-CM | POA: Diagnosis not present

## 2020-11-19 DIAGNOSIS — R69 Illness, unspecified: Secondary | ICD-10-CM | POA: Diagnosis not present

## 2020-11-19 DIAGNOSIS — Z79899 Other long term (current) drug therapy: Secondary | ICD-10-CM | POA: Insufficient documentation

## 2020-11-19 DIAGNOSIS — D473 Essential (hemorrhagic) thrombocythemia: Secondary | ICD-10-CM

## 2020-11-19 DIAGNOSIS — D509 Iron deficiency anemia, unspecified: Secondary | ICD-10-CM | POA: Diagnosis not present

## 2020-11-19 DIAGNOSIS — Z8542 Personal history of malignant neoplasm of other parts of uterus: Secondary | ICD-10-CM | POA: Diagnosis not present

## 2020-11-19 DIAGNOSIS — D75839 Thrombocytosis, unspecified: Secondary | ICD-10-CM | POA: Diagnosis not present

## 2020-11-19 DIAGNOSIS — E782 Mixed hyperlipidemia: Secondary | ICD-10-CM | POA: Diagnosis not present

## 2020-11-19 LAB — CBC WITH DIFFERENTIAL (CANCER CENTER ONLY)
Abs Immature Granulocytes: 0.02 10*3/uL (ref 0.00–0.07)
Basophils Absolute: 0.1 10*3/uL (ref 0.0–0.1)
Basophils Relative: 1 %
Eosinophils Absolute: 0.2 10*3/uL (ref 0.0–0.5)
Eosinophils Relative: 2 %
HCT: 44 % (ref 36.0–46.0)
Hemoglobin: 14 g/dL (ref 12.0–15.0)
Immature Granulocytes: 0 %
Lymphocytes Relative: 19 %
Lymphs Abs: 1.5 10*3/uL (ref 0.7–4.0)
MCH: 28.6 pg (ref 26.0–34.0)
MCHC: 31.8 g/dL (ref 30.0–36.0)
MCV: 89.8 fL (ref 80.0–100.0)
Monocytes Absolute: 0.6 10*3/uL (ref 0.1–1.0)
Monocytes Relative: 8 %
Neutro Abs: 5.5 10*3/uL (ref 1.7–7.7)
Neutrophils Relative %: 70 %
Platelet Count: 520 10*3/uL — ABNORMAL HIGH (ref 150–400)
RBC: 4.9 MIL/uL (ref 3.87–5.11)
RDW: 16.4 % — ABNORMAL HIGH (ref 11.5–15.5)
WBC Count: 7.9 10*3/uL (ref 4.0–10.5)
nRBC: 0 % (ref 0.0–0.2)

## 2020-11-19 LAB — CMP (CANCER CENTER ONLY)
ALT: 23 U/L (ref 0–44)
AST: 32 U/L (ref 15–41)
Albumin: 4.4 g/dL (ref 3.5–5.0)
Alkaline Phosphatase: 75 U/L (ref 38–126)
Anion gap: 8 (ref 5–15)
BUN: 20 mg/dL (ref 8–23)
CO2: 26 mmol/L (ref 22–32)
Calcium: 9.2 mg/dL (ref 8.9–10.3)
Chloride: 105 mmol/L (ref 98–111)
Creatinine: 1.08 mg/dL — ABNORMAL HIGH (ref 0.44–1.00)
GFR, Estimated: 55 mL/min — ABNORMAL LOW (ref >60)
Glucose, Bld: 93 mg/dL (ref 70–99)
Potassium: 4.4 mmol/L (ref 3.5–5.1)
Sodium: 139 mmol/L (ref 135–145)
Total Bilirubin: 1.1 mg/dL (ref 0.3–1.2)
Total Protein: 7.5 g/dL (ref 6.5–8.1)

## 2020-11-19 LAB — LACTATE DEHYDROGENASE: LDH: 265 U/L — ABNORMAL HIGH (ref 98–192)

## 2020-11-19 NOTE — Progress Notes (Signed)
[place] Telephone:(336) [phone]   Fax:(336) [phone]  OFFICE PROGRESS NOTE  [name], MD [address]  [place] [address]  DIAGNOSIS: Essential Thrombocythemia. [name] 2 mutation positive.  PRIOR THERAPY: None  CURRENT THERAPY:  1) Hydroxyurea 1000 mg every day. First dose on 07/03/20 started as 500 mg p.o. daily. 2) Integra plus 1 tablet p.o. daily   INTERVAL HISTORY: Ruth Sharp 70 y.o. female returns to the clinic today for follow-up visit.  The patient is feeling fine today with no concerning complaints.  The patient is tolerating her current dose of hydroxyurea 1000 mg alternating with 500 mg every other day fairly well.  She denied having any nausea or vomiting.  She has no abdominal pain, diarrhea or constipation.  She denied having any fatigue or weakness she has no chest pain, shortness of breath, cough or hemoptysis.  She is here today for evaluation and repeat CBC, comprehensive metabolic panel and LDH.  MEDICAL HISTORY: Past Medical History:  Diagnosis Date  . Arthritis   . Tachycardia     ALLERGIES:  is allergic to keflex [cephalexin] and tape.  MEDICATIONS:  Current Outpatient Medications  Medication Sig Dispense Refill  . atorvastatin (LIPITOR) 20 MG tablet Take 1 tablet (20 mg total) by mouth daily. 90 tablet 3  . Bacillus Coagulans-Inulin (PROBIOTIC) 1-250 BILLION-MG CAPS Take 1 capsule by mouth daily. Pt states uses [employer] brand    . buPROPion (WELLBUTRIN) 75 MG tablet Take 75 mg by mouth 2 (two) times daily.    . Calcium Carbonate-Vitamin D (CALTRATE 600+D PO) Take 1 tablet by mouth daily.    Marland Kitchen FeFum-FePoly-FA-B Cmp-C-Biot (FOLIVANE-PLUS) CAPS TAKE 1 CAPSULE BY MOUTH EVERY DAY IN THE MORNING 90 capsule 1  . hydroxychloroquine (PLAQUENIL) 200 MG tablet Take 2 tablets (400 mg total) by mouth daily. 90 tablet 3  . hydroxyurea (HYDREA) 500 MG capsule TAKE 1 CAPSULE (500 MG TOTAL) BY MOUTH DAILY. MAY TAKE WITH FOOD  TO MINIMIZE GI SIDE EFFECTS. 90 capsule 1  . metoprolol tartrate (LOPRESSOR) 50 MG tablet TAKE 1&1/2 TABLETS BY MOUTH TWICE DAILY. 135 tablet 3  . Multiple Vitamins-Minerals (CENTRUM SILVER) CHEW Chew 1 each by mouth daily.    . prochlorperazine (COMPAZINE) 10 MG tablet Take 1 tablet (10 mg total) by mouth every 6 (six) hours as needed for nausea or vomiting. 30 tablet 0  . RESTASIS 0.05 % ophthalmic emulsion Place 1 drop into both eyes 2 (two) times daily.     . TURMERIC PO Take by mouth.     No current facility-administered medications for this visit.    SURGICAL HISTORY:  Past Surgical History:  Procedure Laterality Date  . ABDOMINAL HYSTERECTOMY    . BREAST EXCISIONAL BIOPSY Right    benign  . knee replacements      REVIEW OF SYSTEMS:  A comprehensive review of systems was negative.   PHYSICAL EXAMINATION: General appearance: alert, cooperative and no distress Head: Normocephalic, without obvious abnormality, atraumatic Neck: no adenopathy, no JVD, supple, symmetrical, trachea midline and thyroid not enlarged, symmetric, no tenderness/mass/nodules Lymph nodes: Cervical, supraclavicular, and axillary nodes normal. Resp: clear to auscultation bilaterally Back: symmetric, no curvature. ROM normal. No CVA tenderness. Cardio: regular rate and rhythm, S1, S2 normal, no murmur, click, rub or gallop GI: soft, non-tender; bowel sounds normal; no masses,  no organomegaly Extremities: extremities normal, atraumatic, no cyanosis or edema  ECOG PERFORMANCE STATUS: 0 - Asymptomatic  Blood pressure 108/62, pulse 63,  temperature 97.9 F (36.6 C), temperature source Tympanic, resp. rate 15, height 5\' 7"  (1.702 m), weight 170 lb 8 oz (77.3 kg), SpO2 100 %.  LABORATORY DATA: Lab Results  Component Value Date   WBC 7.9 11/19/2020   HGB 14.0 11/19/2020   HCT 44.0 11/19/2020   MCV 89.8 11/19/2020   PLT 520 (H) 11/19/2020      Chemistry      Component Value Date/Time   NA 141 10/22/2020  0958   NA 142 04/20/2017 1033   K 4.6 10/22/2020 0958   CL 108 10/22/2020 0958   CO2 27 10/22/2020 0958   BUN 16 10/22/2020 0958   BUN 16 04/20/2017 1033   CREATININE 1.05 (H) 10/22/2020 0958      Component Value Date/Time   CALCIUM 9.2 10/22/2020 0958   ALKPHOS 78 10/22/2020 0958   AST 38 10/22/2020 0958   ALT 31 10/22/2020 0958   BILITOT 1.0 10/22/2020 0958       RADIOGRAPHIC STUDIES: No results found.  ASSESSMENT AND PLAN: This is a very pleasant 70 years old white female recently diagnosed with essential thrombocythemia as well as iron deficiency anemia.  The patient is currently on treatment with hydroxyurea 500 mg alternating with 1000 mg p.o. every other day addition to Integra +1 capsule p.o. daily. She has been tolerating this treatment well with no concerning complaints. Repeat CBC today showed platelets count of 520,000. She has normal total white blood count and hemoglobin as well as hematocrit. I recommended for her to increase her dose of hydroxyurea to 1000 mg p.o. daily for the next few weeks. I will see her back for follow-up visit in 1 months for evaluation and repeat CBC, comprehensive metabolic panel and LDH. The patient was advised to call immediately if she has any concerning symptoms in the interval. The patient voices understanding of current disease status and treatment options and is in agreement with the current care plan.  All questions were answered. The patient knows to call the clinic with any problems, questions or concerns. We can certainly see the patient much sooner if necessary.  Disclaimer: This note was dictated with voice recognition software. Similar sounding words can inadvertently be transcribed and may not be corrected upon review.

## 2020-11-19 NOTE — Telephone Encounter (Signed)
Scheduled per 03/08 los, patient will be notified per My chart.

## 2020-11-25 DIAGNOSIS — M150 Primary generalized (osteo)arthritis: Secondary | ICD-10-CM | POA: Diagnosis not present

## 2020-11-25 DIAGNOSIS — M2550 Pain in unspecified joint: Secondary | ICD-10-CM | POA: Diagnosis not present

## 2020-11-25 DIAGNOSIS — R5383 Other fatigue: Secondary | ICD-10-CM | POA: Diagnosis not present

## 2020-11-25 DIAGNOSIS — M8589 Other specified disorders of bone density and structure, multiple sites: Secondary | ICD-10-CM | POA: Diagnosis not present

## 2020-11-25 DIAGNOSIS — E663 Overweight: Secondary | ICD-10-CM | POA: Diagnosis not present

## 2020-11-25 DIAGNOSIS — D473 Essential (hemorrhagic) thrombocythemia: Secondary | ICD-10-CM | POA: Diagnosis not present

## 2020-11-25 DIAGNOSIS — M0579 Rheumatoid arthritis with rheumatoid factor of multiple sites without organ or systems involvement: Secondary | ICD-10-CM | POA: Diagnosis not present

## 2020-11-25 DIAGNOSIS — M25471 Effusion, right ankle: Secondary | ICD-10-CM | POA: Diagnosis not present

## 2020-11-25 DIAGNOSIS — Z6828 Body mass index (BMI) 28.0-28.9, adult: Secondary | ICD-10-CM | POA: Diagnosis not present

## 2020-12-17 ENCOUNTER — Inpatient Hospital Stay: Payer: Medicare HMO | Admitting: Internal Medicine

## 2020-12-17 ENCOUNTER — Other Ambulatory Visit: Payer: Self-pay

## 2020-12-17 ENCOUNTER — Inpatient Hospital Stay: Payer: Medicare HMO | Attending: Internal Medicine

## 2020-12-17 VITALS — BP 107/77 | HR 61 | Temp 98.5°F | Resp 15 | Ht 67.0 in | Wt 174.6 lb

## 2020-12-17 DIAGNOSIS — D473 Essential (hemorrhagic) thrombocythemia: Secondary | ICD-10-CM

## 2020-12-17 DIAGNOSIS — D509 Iron deficiency anemia, unspecified: Secondary | ICD-10-CM | POA: Diagnosis not present

## 2020-12-17 LAB — CBC WITH DIFFERENTIAL (CANCER CENTER ONLY)
Abs Immature Granulocytes: 0.01 10*3/uL (ref 0.00–0.07)
Basophils Absolute: 0.1 10*3/uL (ref 0.0–0.1)
Basophils Relative: 1 %
Eosinophils Absolute: 0.2 10*3/uL (ref 0.0–0.5)
Eosinophils Relative: 3 %
HCT: 40.6 % (ref 36.0–46.0)
Hemoglobin: 12.9 g/dL (ref 12.0–15.0)
Immature Granulocytes: 0 %
Lymphocytes Relative: 25 %
Lymphs Abs: 1.3 10*3/uL (ref 0.7–4.0)
MCH: 29.1 pg (ref 26.0–34.0)
MCHC: 31.8 g/dL (ref 30.0–36.0)
MCV: 91.6 fL (ref 80.0–100.0)
Monocytes Absolute: 0.5 10*3/uL (ref 0.1–1.0)
Monocytes Relative: 9 %
Neutro Abs: 3.2 10*3/uL (ref 1.7–7.7)
Neutrophils Relative %: 62 %
Platelet Count: 368 10*3/uL (ref 150–400)
RBC: 4.43 MIL/uL (ref 3.87–5.11)
RDW: 15.3 % (ref 11.5–15.5)
WBC Count: 5.2 10*3/uL (ref 4.0–10.5)
nRBC: 0 % (ref 0.0–0.2)

## 2020-12-17 LAB — CMP (CANCER CENTER ONLY)
ALT: 25 U/L (ref 0–44)
AST: 27 U/L (ref 15–41)
Albumin: 4 g/dL (ref 3.5–5.0)
Alkaline Phosphatase: 71 U/L (ref 38–126)
Anion gap: 9 (ref 5–15)
BUN: 18 mg/dL (ref 8–23)
CO2: 28 mmol/L (ref 22–32)
Calcium: 8.6 mg/dL — ABNORMAL LOW (ref 8.9–10.3)
Chloride: 104 mmol/L (ref 98–111)
Creatinine: 1.07 mg/dL — ABNORMAL HIGH (ref 0.44–1.00)
GFR, Estimated: 56 mL/min — ABNORMAL LOW (ref >60)
Glucose, Bld: 70 mg/dL (ref 70–99)
Potassium: 4.8 mmol/L (ref 3.5–5.1)
Sodium: 141 mmol/L (ref 135–145)
Total Bilirubin: 1 mg/dL (ref 0.3–1.2)
Total Protein: 6.7 g/dL (ref 6.5–8.1)

## 2020-12-17 LAB — LACTATE DEHYDROGENASE: LDH: 243 U/L — ABNORMAL HIGH (ref 98–192)

## 2020-12-17 NOTE — Progress Notes (Signed)
[facility] Telephone:([phone]   Fax:(336) [phone]  OFFICE PROGRESS NOTE  [name], MD [address] [place] [address]  DIAGNOSIS: Essential Thrombocythemia. [name] 2 mutation positive.  PRIOR THERAPY: None  CURRENT THERAPY:  1) Hydroxyurea 1000 mg every day. First dose on 07/03/20 started as 500 mg p.o. daily. 2) Integra plus 1 tablet p.o. daily   INTERVAL HISTORY: Ruth Sharp 70 y.o. female returns to the clinic today for follow-up visit.  The patient is feeling fine today with no concerning complaints.  She tolerated the hydroxyurea 1000 mg p.o. daily fairly well.  She denied having any nausea, vomiting, diarrhea or constipation.  She denied having any weight loss or night sweats.  She has no chest pain, shortness of breath, cough or hemoptysis.  She is here today for evaluation and repeat CBC, comprehensive metabolic panel and LDH.  MEDICAL HISTORY: Past Medical History:  Diagnosis Date  . Arthritis   . Tachycardia     ALLERGIES:  is allergic to keflex [cephalexin] and tape.  MEDICATIONS:  Current Outpatient Medications  Medication Sig Dispense Refill  . atorvastatin (LIPITOR) 20 MG tablet Take 1 tablet (20 mg total) by mouth daily. 90 tablet 3  . Bacillus Coagulans-Inulin (PROBIOTIC) 1-250 BILLION-MG CAPS Take 1 capsule by mouth daily. Pt states uses [employer] brand    . buPROPion (WELLBUTRIN) 75 MG tablet Take 75 mg by mouth 2 (two) times daily.    . Calcium Carbonate-Vitamin D (CALTRATE 600+D PO) Take 1 tablet by mouth daily.    Marland Kitchen FeFum-FePoly-FA-B Cmp-C-Biot (FOLIVANE-PLUS) CAPS TAKE 1 CAPSULE BY MOUTH EVERY DAY IN THE MORNING 90 capsule 1  . hydroxychloroquine (PLAQUENIL) 200 MG tablet Take 2 tablets (400 mg total) by mouth daily. 90 tablet 3  . hydroxyurea (HYDREA) 500 MG capsule TAKE 1 CAPSULE (500 MG TOTAL) BY MOUTH DAILY. MAY TAKE WITH FOOD TO MINIMIZE GI SIDE EFFECTS. 90 capsule 1  . metoprolol tartrate (LOPRESSOR)  50 MG tablet TAKE 1&1/2 TABLETS BY MOUTH TWICE DAILY. 135 tablet 3  . Multiple Vitamins-Minerals (CENTRUM SILVER) CHEW Chew 1 each by mouth daily.    . prochlorperazine (COMPAZINE) 10 MG tablet Take 1 tablet (10 mg total) by mouth every 6 (six) hours as needed for nausea or vomiting. 30 tablet 0  . RESTASIS 0.05 % ophthalmic emulsion Place 1 drop into both eyes 2 (two) times daily.     . TURMERIC PO Take by mouth.     No current facility-administered medications for this visit.    SURGICAL HISTORY:  Past Surgical History:  Procedure Laterality Date  . ABDOMINAL HYSTERECTOMY    . BREAST EXCISIONAL BIOPSY Right    benign  . knee replacements      REVIEW OF SYSTEMS:  A comprehensive review of systems was negative.   PHYSICAL EXAMINATION: General appearance: alert, cooperative and no distress Head: Normocephalic, without obvious abnormality, atraumatic Neck: no adenopathy, no JVD, supple, symmetrical, trachea midline and thyroid not enlarged, symmetric, no tenderness/mass/nodules Lymph nodes: Cervical, supraclavicular, and axillary nodes normal. Resp: clear to auscultation bilaterally Back: symmetric, no curvature. ROM normal. No CVA tenderness. Cardio: regular rate and rhythm, S1, S2 normal, no murmur, click, rub or gallop GI: soft, non-tender; bowel sounds normal; no masses,  no organomegaly Extremities: extremities normal, atraumatic, no cyanosis or edema  ECOG PERFORMANCE STATUS: 0 - Asymptomatic  Blood pressure 107/77, pulse 61, temperature 98.5 F (36.9 C), temperature source Tympanic, resp. rate 15, height 5\' 7"  (  1.702 m), weight 174 lb 9.6 oz (79.2 kg), SpO2 99 %.  LABORATORY DATA: Lab Results  Component Value Date   WBC 5.2 12/17/2020   HGB 12.9 12/17/2020   HCT 40.6 12/17/2020   MCV 91.6 12/17/2020   PLT 368 12/17/2020      Chemistry      Component Value Date/Time   NA 139 11/19/2020 1501   NA 142 04/20/2017 1033   K 4.4 11/19/2020 1501   CL 105 11/19/2020  1501   CO2 26 11/19/2020 1501   BUN 20 11/19/2020 1501   BUN 16 04/20/2017 1033   CREATININE 1.08 (H) 11/19/2020 1501      Component Value Date/Time   CALCIUM 9.2 11/19/2020 1501   ALKPHOS 75 11/19/2020 1501   AST 32 11/19/2020 1501   ALT 23 11/19/2020 1501   BILITOT 1.1 11/19/2020 1501       RADIOGRAPHIC STUDIES: No results found.  ASSESSMENT AND PLAN: This is a very pleasant 70 years old white female recently diagnosed with essential thrombocythemia as well as iron deficiency anemia.  The patient is currently on treatment with hydroxyurea 1000 mg p.o. every day addition to Integra +1 capsule p.o. daily. She has been tolerating this treatment well with no concerning complaints. CBC today showed normal hemoglobin hematocrit as well as normal white blood count and normal platelets count of 368,000. I recommended for the patient to decrease the dose of hydroxyurea to be 1000 mg alternating with 500 mg every other day. I will see her back for follow-up visit in 1 months for evaluation and repeat CBC and LDH. She was advised to call immediately if she has any concerning symptoms in the interval. The patient voices understanding of current disease status and treatment options and is in agreement with the current care plan.  All questions were answered. The patient knows to call the clinic with any problems, questions or concerns. We can certainly see the patient much sooner if necessary.  Disclaimer: This note was dictated with voice recognition software. Similar sounding words can inadvertently be transcribed and may not be corrected upon review.

## 2020-12-20 ENCOUNTER — Telehealth: Payer: Self-pay | Admitting: Internal Medicine

## 2020-12-20 NOTE — Telephone Encounter (Signed)
Scheduled per los. Called and spoke with patient. Confirmed appt 

## 2021-01-08 DIAGNOSIS — I491 Atrial premature depolarization: Secondary | ICD-10-CM | POA: Diagnosis not present

## 2021-01-08 DIAGNOSIS — M069 Rheumatoid arthritis, unspecified: Secondary | ICD-10-CM | POA: Diagnosis not present

## 2021-01-08 DIAGNOSIS — D473 Essential (hemorrhagic) thrombocythemia: Secondary | ICD-10-CM | POA: Diagnosis not present

## 2021-01-08 DIAGNOSIS — Z1211 Encounter for screening for malignant neoplasm of colon: Secondary | ICD-10-CM | POA: Diagnosis not present

## 2021-01-08 DIAGNOSIS — Z6826 Body mass index (BMI) 26.0-26.9, adult: Secondary | ICD-10-CM | POA: Diagnosis not present

## 2021-01-08 DIAGNOSIS — R69 Illness, unspecified: Secondary | ICD-10-CM | POA: Diagnosis not present

## 2021-01-14 ENCOUNTER — Telehealth: Payer: Self-pay | Admitting: Medical Oncology

## 2021-01-14 ENCOUNTER — Other Ambulatory Visit: Payer: Medicare HMO

## 2021-01-14 ENCOUNTER — Telehealth: Payer: Self-pay | Admitting: Internal Medicine

## 2021-01-14 ENCOUNTER — Ambulatory Visit: Payer: Medicare HMO | Admitting: Internal Medicine

## 2021-01-14 NOTE — Telephone Encounter (Signed)
Scheduled appts per 5/3 sch msg. Pt aware.  

## 2021-01-14 NOTE — Telephone Encounter (Signed)
She did not get a appt when she left at last visit. She did not have it written down., Schedule message sent.

## 2021-02-11 ENCOUNTER — Inpatient Hospital Stay: Payer: Medicare HMO | Attending: Internal Medicine

## 2021-02-11 ENCOUNTER — Inpatient Hospital Stay: Payer: Medicare HMO | Admitting: Internal Medicine

## 2021-02-11 ENCOUNTER — Other Ambulatory Visit: Payer: Self-pay

## 2021-02-11 VITALS — BP 112/71 | HR 62 | Temp 97.2°F | Resp 18 | Ht 67.0 in | Wt 174.0 lb

## 2021-02-11 DIAGNOSIS — D473 Essential (hemorrhagic) thrombocythemia: Secondary | ICD-10-CM | POA: Diagnosis not present

## 2021-02-11 DIAGNOSIS — Z79899 Other long term (current) drug therapy: Secondary | ICD-10-CM | POA: Insufficient documentation

## 2021-02-11 DIAGNOSIS — D509 Iron deficiency anemia, unspecified: Secondary | ICD-10-CM | POA: Diagnosis not present

## 2021-02-11 LAB — CBC WITH DIFFERENTIAL (CANCER CENTER ONLY)
Abs Immature Granulocytes: 0.01 10*3/uL (ref 0.00–0.07)
Basophils Absolute: 0.1 10*3/uL (ref 0.0–0.1)
Basophils Relative: 1 %
Eosinophils Absolute: 0.1 10*3/uL (ref 0.0–0.5)
Eosinophils Relative: 3 %
HCT: 40.8 % (ref 36.0–46.0)
Hemoglobin: 13.1 g/dL (ref 12.0–15.0)
Immature Granulocytes: 0 %
Lymphocytes Relative: 23 %
Lymphs Abs: 1.2 10*3/uL (ref 0.7–4.0)
MCH: 30.2 pg (ref 26.0–34.0)
MCHC: 32.1 g/dL (ref 30.0–36.0)
MCV: 94 fL (ref 80.0–100.0)
Monocytes Absolute: 0.5 10*3/uL (ref 0.1–1.0)
Monocytes Relative: 10 %
Neutro Abs: 3.2 10*3/uL (ref 1.7–7.7)
Neutrophils Relative %: 63 %
Platelet Count: 412 10*3/uL — ABNORMAL HIGH (ref 150–400)
RBC: 4.34 MIL/uL (ref 3.87–5.11)
RDW: 14.8 % (ref 11.5–15.5)
WBC Count: 5.1 10*3/uL (ref 4.0–10.5)
nRBC: 0 % (ref 0.0–0.2)

## 2021-02-11 LAB — LACTATE DEHYDROGENASE: LDH: 259 U/L — ABNORMAL HIGH (ref 98–192)

## 2021-02-11 MED ORDER — HYDROXYUREA 500 MG PO CAPS: 500.0000 mg | ORAL_CAPSULE | Freq: Every day | ORAL | 1 refills | Status: DC

## 2021-02-11 NOTE — Progress Notes (Signed)
[facility] Telephone:(336) [phone]   Fax:([phone]  OFFICE PROGRESS NOTE  [name], MD [address] [place] [address]  DIAGNOSIS: Essential Thrombocythemia. [name] 2 mutation positive.  PRIOR THERAPY: None  CURRENT THERAPY:  1) Hydroxyurea 1000 mg every day. First dose on 07/03/20 started as 500 mg p.o. daily. 2) Integra plus 1 tablet p.o. daily   INTERVAL HISTORY: Ruth Sharp 70 y.o. female returns to the clinic today for follow-up visit.  The patient is feeling fine today with no concerning complaints.  She is tolerating her current treatment with hydroxyurea fairly well.  She denied having any current chest pain, shortness of breath, cough or hemoptysis.  She denied having any fever or chills.  She has no nausea, vomiting, diarrhea or constipation.  She has no headache or visual changes.  She is here today for evaluation and repeat blood work.  MEDICAL HISTORY: Past Medical History:  Diagnosis Date  . Arthritis   . Tachycardia     ALLERGIES:  is allergic to keflex [cephalexin] and tape.  MEDICATIONS:  Current Outpatient Medications  Medication Sig Dispense Refill  . atorvastatin (LIPITOR) 20 MG tablet Take 1 tablet (20 mg total) by mouth daily. 90 tablet 3  . Bacillus Coagulans-Inulin (PROBIOTIC) 1-250 BILLION-MG CAPS Take 1 capsule by mouth daily. Pt states uses [employer] brand    . buPROPion (WELLBUTRIN) 75 MG tablet Take 75 mg by mouth 2 (two) times daily.    . Calcium Carbonate-Vitamin D (CALTRATE 600+D PO) Take 1 tablet by mouth daily.    Marland Kitchen FeFum-FePoly-FA-B Cmp-C-Biot (FOLIVANE-PLUS) CAPS TAKE 1 CAPSULE BY MOUTH EVERY DAY IN THE MORNING 90 capsule 1  . hydroxychloroquine (PLAQUENIL) 200 MG tablet Take 2 tablets (400 mg total) by mouth daily. 90 tablet 3  . hydroxyurea (HYDREA) 500 MG capsule TAKE 1 CAPSULE (500 MG TOTAL) BY MOUTH DAILY. MAY TAKE WITH FOOD TO MINIMIZE GI SIDE EFFECTS. 90 capsule 1  . metoprolol tartrate  (LOPRESSOR) 50 MG tablet TAKE 1&1/2 TABLETS BY MOUTH TWICE DAILY. 135 tablet 3  . Multiple Vitamins-Minerals (CENTRUM SILVER) CHEW Chew 1 each by mouth daily.    . prochlorperazine (COMPAZINE) 10 MG tablet Take 1 tablet (10 mg total) by mouth every 6 (six) hours as needed for nausea or vomiting. 30 tablet 0  . RESTASIS 0.05 % ophthalmic emulsion Place 1 drop into both eyes 2 (two) times daily.     . TURMERIC PO Take by mouth.     No current facility-administered medications for this visit.    SURGICAL HISTORY:  Past Surgical History:  Procedure Laterality Date  . ABDOMINAL HYSTERECTOMY    . BREAST EXCISIONAL BIOPSY Right    benign  . knee replacements      REVIEW OF SYSTEMS:  A comprehensive review of systems was negative.   PHYSICAL EXAMINATION: General appearance: alert, cooperative and no distress Head: Normocephalic, without obvious abnormality, atraumatic Neck: no adenopathy, no JVD, supple, symmetrical, trachea midline and thyroid not enlarged, symmetric, no tenderness/mass/nodules Lymph nodes: Cervical, supraclavicular, and axillary nodes normal. Resp: clear to auscultation bilaterally Back: symmetric, no curvature. ROM normal. No CVA tenderness. Cardio: regular rate and rhythm, S1, S2 normal, no murmur, click, rub or gallop GI: soft, non-tender; bowel sounds normal; no masses,  no organomegaly Extremities: extremities normal, atraumatic, no cyanosis or edema  ECOG PERFORMANCE STATUS: 0 - Asymptomatic  Blood pressure 112/71, pulse 62, temperature (!) 97.2 F (36.2 C), temperature source Tympanic, resp. rate  18, height 5\' 7"  (1.702 m), weight 174 lb (78.9 kg), SpO2 99 %.  LABORATORY DATA: Lab Results  Component Value Date   WBC 5.1 02/11/2021   HGB 13.1 02/11/2021   HCT 40.8 02/11/2021   MCV 94.0 02/11/2021   PLT 412 (H) 02/11/2021      Chemistry      Component Value Date/Time   NA 141 12/17/2020 1319   NA 142 04/20/2017 1033   K 4.8 12/17/2020 1319   CL 104  12/17/2020 1319   CO2 28 12/17/2020 1319   BUN 18 12/17/2020 1319   BUN 16 04/20/2017 1033   CREATININE 1.07 (H) 12/17/2020 1319      Component Value Date/Time   CALCIUM 8.6 (L) 12/17/2020 1319   ALKPHOS 71 12/17/2020 1319   AST 27 12/17/2020 1319   ALT 25 12/17/2020 1319   BILITOT 1.0 12/17/2020 1319       RADIOGRAPHIC STUDIES: No results found.  ASSESSMENT AND PLAN: This is a very pleasant 70 years old white female recently diagnosed with essential thrombocythemia as well as iron deficiency anemia.  The patient is currently on treatment with hydroxyurea 1000 mg p.o. alternating with 500 mg every other day in addition to Integra +1 capsule p.o. daily. She continues to tolerate her treatment well with no concerning adverse effects. CBC today showed normal total white blood count, hemoglobin and hematocrit and slightly elevated platelets count. I recommended for the patient to continue on the same regimen with hydroxyurea 1000 mg alternating with 500 mg every other day. I will see her back for follow-up visit in 1 months for evaluation and repeat blood work. She was advised to call immediately if she has any concerning symptoms in the interval. The patient voices understanding of current disease status and treatment options and is in agreement with the current care plan.  All questions were answered. The patient knows to call the clinic with any problems, questions or concerns. We can certainly see the patient much sooner if necessary.  Disclaimer: This note was dictated with voice recognition software. Similar sounding words can inadvertently be transcribed and may not be corrected upon review.

## 2021-02-16 DIAGNOSIS — E782 Mixed hyperlipidemia: Secondary | ICD-10-CM | POA: Diagnosis not present

## 2021-02-16 DIAGNOSIS — D509 Iron deficiency anemia, unspecified: Secondary | ICD-10-CM | POA: Diagnosis not present

## 2021-02-16 DIAGNOSIS — M069 Rheumatoid arthritis, unspecified: Secondary | ICD-10-CM | POA: Diagnosis not present

## 2021-03-12 ENCOUNTER — Other Ambulatory Visit: Payer: Self-pay

## 2021-03-12 ENCOUNTER — Inpatient Hospital Stay: Payer: Medicare HMO | Admitting: Internal Medicine

## 2021-03-12 ENCOUNTER — Inpatient Hospital Stay: Payer: Medicare HMO | Attending: Internal Medicine

## 2021-03-12 VITALS — BP 121/77 | HR 63 | Temp 97.4°F | Resp 17 | Ht 67.0 in | Wt 173.7 lb

## 2021-03-12 DIAGNOSIS — D473 Essential (hemorrhagic) thrombocythemia: Secondary | ICD-10-CM

## 2021-03-12 DIAGNOSIS — Z79899 Other long term (current) drug therapy: Secondary | ICD-10-CM | POA: Diagnosis not present

## 2021-03-12 DIAGNOSIS — D509 Iron deficiency anemia, unspecified: Secondary | ICD-10-CM | POA: Insufficient documentation

## 2021-03-12 LAB — CBC WITH DIFFERENTIAL (CANCER CENTER ONLY)
Abs Immature Granulocytes: 0.01 10*3/uL (ref 0.00–0.07)
Basophils Absolute: 0.1 10*3/uL (ref 0.0–0.1)
Basophils Relative: 1 %
Eosinophils Absolute: 0.1 10*3/uL (ref 0.0–0.5)
Eosinophils Relative: 3 %
HCT: 39.7 % (ref 36.0–46.0)
Hemoglobin: 12.8 g/dL (ref 12.0–15.0)
Immature Granulocytes: 0 %
Lymphocytes Relative: 24 %
Lymphs Abs: 1.3 10*3/uL (ref 0.7–4.0)
MCH: 30.1 pg (ref 26.0–34.0)
MCHC: 32.2 g/dL (ref 30.0–36.0)
MCV: 93.4 fL (ref 80.0–100.0)
Monocytes Absolute: 0.5 10*3/uL (ref 0.1–1.0)
Monocytes Relative: 9 %
Neutro Abs: 3.4 10*3/uL (ref 1.7–7.7)
Neutrophils Relative %: 63 %
Platelet Count: 365 10*3/uL (ref 150–400)
RBC: 4.25 MIL/uL (ref 3.87–5.11)
RDW: 13.9 % (ref 11.5–15.5)
WBC Count: 5.3 10*3/uL (ref 4.0–10.5)
nRBC: 0 % (ref 0.0–0.2)

## 2021-03-12 LAB — CMP (CANCER CENTER ONLY)
ALT: 17 U/L (ref 0–44)
AST: 24 U/L (ref 15–41)
Albumin: 3.8 g/dL (ref 3.5–5.0)
Alkaline Phosphatase: 67 U/L (ref 38–126)
Anion gap: 7 (ref 5–15)
BUN: 18 mg/dL (ref 8–23)
CO2: 28 mmol/L (ref 22–32)
Calcium: 9.3 mg/dL (ref 8.9–10.3)
Chloride: 107 mmol/L (ref 98–111)
Creatinine: 1.06 mg/dL — ABNORMAL HIGH (ref 0.44–1.00)
GFR, Estimated: 57 mL/min — ABNORMAL LOW (ref >60)
Glucose, Bld: 91 mg/dL (ref 70–99)
Potassium: 4.4 mmol/L (ref 3.5–5.1)
Sodium: 142 mmol/L (ref 135–145)
Total Bilirubin: 0.7 mg/dL (ref 0.3–1.2)
Total Protein: 6.6 g/dL (ref 6.5–8.1)

## 2021-03-12 LAB — LACTATE DEHYDROGENASE: LDH: 254 U/L — ABNORMAL HIGH (ref 98–192)

## 2021-03-12 NOTE — Progress Notes (Signed)
[facility] [place] [phone]   Fax:(336) [phone]  OFFICE PROGRESS NOTE  [name], MD [address] [place]  [address]  DIAGNOSIS: Essential Thrombocythemia. [name] 2 mutation positive.    PRIOR THERAPY: None   CURRENT THERAPY:  1) Hydroxyurea 1000 mg every day. First dose on 07/03/20 started as 500 mg p.o. daily. 2) Integra plus 1 tablet p.o. daily    INTERVAL HISTORY: Ruth Sharp 70 y.o. female returns to the clinic today for follow-up visit.  The patient has no complaints today.  She denied having any chest pain, shortness of breath, cough or hemoptysis.  She has no bleeding, bruises or ecchymosis.  She has no nausea, vomiting, diarrhea or constipation.  She has no recent weight loss or night sweats.  She continues to tolerate her treatment with hydroxyurea fairly well.  She is here today for evaluation and repeat blood work.  MEDICAL HISTORY: Past Medical History:  Diagnosis Date   Arthritis    Tachycardia     ALLERGIES:  is allergic to keflex [cephalexin] and tape.  MEDICATIONS:  Current Outpatient Medications  Medication Sig Dispense Refill   atorvastatin (LIPITOR) 20 MG tablet Take 1 tablet (20 mg total) by mouth daily. 90 tablet 3   Bacillus Coagulans-Inulin (PROBIOTIC) 1-250 BILLION-MG CAPS Take 1 capsule by mouth daily. Pt states uses [employer] brand     buPROPion (WELLBUTRIN) 75 MG tablet Take 75 mg by mouth 2 (two) times daily.     Calcium Carbonate-Vitamin D (CALTRATE 600+D PO) Take 1 tablet by mouth daily.     FeFum-FePoly-FA-B Cmp-C-Biot (FOLIVANE-PLUS) CAPS TAKE 1 CAPSULE BY MOUTH EVERY DAY IN THE MORNING 90 capsule 1   hydroxychloroquine (PLAQUENIL) 200 MG tablet Take 2 tablets (400 mg total) by mouth daily. 90 tablet 3   hydroxyurea (HYDREA) 500 MG capsule Take 1 capsule (500 mg total) by mouth daily. May take with food to minimize GI side effects. 90 capsule 1   ibuprofen (ADVIL) 200 MG tablet Take 1 tablet by  mouth daily as needed. (Patient not taking: Reported on 02/11/2021)     metoprolol tartrate (LOPRESSOR) 50 MG tablet TAKE 1&1/2 TABLETS BY MOUTH TWICE DAILY. 135 tablet 3   Multiple Vitamins-Minerals (CENTRUM SILVER) CHEW Chew 1 each by mouth daily.     prochlorperazine (COMPAZINE) 10 MG tablet Take 1 tablet (10 mg total) by mouth every 6 (six) hours as needed for nausea or vomiting. (Patient not taking: Reported on 02/11/2021) 30 tablet 0   RESTASIS 0.05 % ophthalmic emulsion Place 1 drop into both eyes 2 (two) times daily.      TURMERIC PO Take by mouth.     No current facility-administered medications for this visit.    SURGICAL HISTORY:  Past Surgical History:  Procedure Laterality Date   ABDOMINAL HYSTERECTOMY     BREAST EXCISIONAL BIOPSY Right    benign   knee replacements      REVIEW OF SYSTEMS:  A comprehensive review of systems was negative.   PHYSICAL EXAMINATION: General appearance: alert, cooperative, and no distress Head: Normocephalic, without obvious abnormality, atraumatic Neck: no adenopathy, no JVD, supple, symmetrical, trachea midline, and thyroid not enlarged, symmetric, no tenderness/mass/nodules Lymph nodes: Cervical, supraclavicular, and axillary nodes normal. Resp: clear to auscultation bilaterally Back: symmetric, no curvature. ROM normal. No CVA tenderness. Cardio: regular rate and rhythm, S1, S2 normal, no murmur, click, rub or gallop GI: soft, non-tender; bowel sounds normal; no masses,  no organomegaly Extremities: extremities  normal, atraumatic, no cyanosis or edema  ECOG PERFORMANCE STATUS: 0 - Asymptomatic  Blood pressure 121/77, pulse 63, temperature (!) 97.4 F (36.3 C), temperature source Tympanic, resp. rate 17, height 5\' 7"  (1.702 m), weight 173 lb 11.2 oz (78.8 kg), SpO2 100 %.  LABORATORY DATA: Lab Results  Component Value Date   WBC 5.3 03/12/2021   HGB 12.8 03/12/2021   HCT 39.7 03/12/2021   MCV 93.4 03/12/2021   PLT 365 03/12/2021       Chemistry      Component Value Date/Time   NA 142 03/12/2021 1338   NA 142 04/20/2017 1033   K 4.4 03/12/2021 1338   CL 107 03/12/2021 1338   CO2 28 03/12/2021 1338   BUN 18 03/12/2021 1338   BUN 16 04/20/2017 1033   CREATININE 1.06 (H) 03/12/2021 1338      Component Value Date/Time   CALCIUM 9.3 03/12/2021 1338   ALKPHOS 67 03/12/2021 1338   AST 24 03/12/2021 1338   ALT 17 03/12/2021 1338   BILITOT 0.7 03/12/2021 1338       RADIOGRAPHIC STUDIES: No results found.  ASSESSMENT AND PLAN: This is a very pleasant 70 years old white female recently diagnosed with essential thrombocythemia as well as iron deficiency anemia.  The patient is currently on treatment with hydroxyurea 1000 mg p.o. alternating with 500 mg every other day in addition to Integra +1 capsule p.o. daily. The patient continues to tolerate her treatment with hydroxyurea and Integra plus fairly well. Repeat CBC is unremarkable today. I recommended for her to continue on the same dose of hydroxyurea 1000 mg alternating with 500 mg every other day. I will see her back for follow-up visit in 6 weeks for evaluation and repeat blood work. She was advised to call immediately if she has any other concerning symptoms in the interval. The patient voices understanding of current disease status and treatment options and is in agreement with the current care plan.  All questions were answered. The patient knows to call the clinic with any problems, questions or concerns. We can certainly see the patient much sooner if necessary.  Disclaimer: This note was dictated with voice recognition software. Similar sounding words can inadvertently be transcribed and may not be corrected upon review.

## 2021-04-23 ENCOUNTER — Encounter: Payer: Self-pay | Admitting: Internal Medicine

## 2021-04-23 ENCOUNTER — Inpatient Hospital Stay: Payer: Medicare HMO | Attending: Internal Medicine

## 2021-04-23 ENCOUNTER — Other Ambulatory Visit: Payer: Self-pay

## 2021-04-23 ENCOUNTER — Inpatient Hospital Stay: Payer: Medicare HMO | Admitting: Internal Medicine

## 2021-04-23 VITALS — BP 128/73 | HR 65 | Temp 97.6°F | Resp 19 | Ht 67.0 in | Wt 172.8 lb

## 2021-04-23 DIAGNOSIS — D473 Essential (hemorrhagic) thrombocythemia: Secondary | ICD-10-CM | POA: Insufficient documentation

## 2021-04-23 DIAGNOSIS — D509 Iron deficiency anemia, unspecified: Secondary | ICD-10-CM | POA: Diagnosis not present

## 2021-04-23 DIAGNOSIS — Z79899 Other long term (current) drug therapy: Secondary | ICD-10-CM | POA: Diagnosis not present

## 2021-04-23 LAB — CMP (CANCER CENTER ONLY)
ALT: 43 U/L (ref 0–44)
AST: 39 U/L (ref 15–41)
Albumin: 4 g/dL (ref 3.5–5.0)
Alkaline Phosphatase: 78 U/L (ref 38–126)
Anion gap: 8 (ref 5–15)
BUN: 15 mg/dL (ref 8–23)
CO2: 29 mmol/L (ref 22–32)
Calcium: 9.5 mg/dL (ref 8.9–10.3)
Chloride: 105 mmol/L (ref 98–111)
Creatinine: 0.98 mg/dL (ref 0.44–1.00)
GFR, Estimated: >60 mL/min (ref >60)
Glucose, Bld: 68 mg/dL — ABNORMAL LOW (ref 70–99)
Potassium: 4.1 mmol/L (ref 3.5–5.1)
Sodium: 142 mmol/L (ref 135–145)
Total Bilirubin: 1 mg/dL (ref 0.3–1.2)
Total Protein: 6.6 g/dL (ref 6.5–8.1)

## 2021-04-23 LAB — CBC WITH DIFFERENTIAL (CANCER CENTER ONLY)
Abs Immature Granulocytes: 0.01 10*3/uL (ref 0.00–0.07)
Basophils Absolute: 0.1 10*3/uL (ref 0.0–0.1)
Basophils Relative: 1 %
Eosinophils Absolute: 0.1 10*3/uL (ref 0.0–0.5)
Eosinophils Relative: 2 %
HCT: 38.7 % (ref 36.0–46.0)
Hemoglobin: 12.8 g/dL (ref 12.0–15.0)
Immature Granulocytes: 0 %
Lymphocytes Relative: 24 %
Lymphs Abs: 1.2 10*3/uL (ref 0.7–4.0)
MCH: 30.5 pg (ref 26.0–34.0)
MCHC: 33.1 g/dL (ref 30.0–36.0)
MCV: 92.4 fL (ref 80.0–100.0)
Monocytes Absolute: 0.5 10*3/uL (ref 0.1–1.0)
Monocytes Relative: 10 %
Neutro Abs: 3.3 10*3/uL (ref 1.7–7.7)
Neutrophils Relative %: 63 %
Platelet Count: 338 10*3/uL (ref 150–400)
RBC: 4.19 MIL/uL (ref 3.87–5.11)
RDW: 14.2 % (ref 11.5–15.5)
WBC Count: 5.2 10*3/uL (ref 4.0–10.5)
nRBC: 0 % (ref 0.0–0.2)

## 2021-04-23 LAB — LACTATE DEHYDROGENASE: LDH: 263 U/L — ABNORMAL HIGH (ref 98–192)

## 2021-04-23 NOTE — Progress Notes (Signed)
[facility] [phone]   [phone]  OFFICE PROGRESS NOTE  [name], MD [address] [place] [address]  DIAGNOSIS: Essential Thrombocythemia. [name] 2 mutation positive.    PRIOR THERAPY: None   CURRENT THERAPY:  1) Hydroxyurea 1000 mg every day. First dose on 07/03/20 started as 500 mg p.o. daily. 2) Integra plus 1 tablet p.o. daily    INTERVAL HISTORY: Ruth Sharp 70 y.o. female returns to the clinic today for follow-up visit.  The patient is feeling fine today with no concerning complaints.  She continues to tolerate her treatment with hydroxyurea and Integra plus fairly well.  She denied having any current chest pain, shortness of breath, cough or hemoptysis.  She denied having any fever or chills.  She has no nausea, vomiting, diarrhea or constipation.  She has no bleeding issues.  She is here today for evaluation and repeat blood work.  MEDICAL HISTORY: Past Medical History:  Diagnosis Date   Arthritis    Tachycardia     ALLERGIES:  is allergic to keflex [cephalexin] and tape.  MEDICATIONS:  Current Outpatient Medications  Medication Sig Dispense Refill   Bacillus Coagulans-Inulin (PROBIOTIC) 1-250 BILLION-MG CAPS Take 1 capsule by mouth daily. Pt states uses Kristopher Oppenheim brand     buPROPion (WELLBUTRIN) 75 MG tablet Take 75 mg by mouth 2 (two) times daily.     Calcium Carbonate-Vitamin D (CALTRATE 600+D PO) Take 1 tablet by mouth daily.     FeFum-FePoly-FA-B Cmp-C-Biot (FOLIVANE-PLUS) CAPS TAKE 1 CAPSULE BY MOUTH EVERY DAY IN THE MORNING 90 capsule 1   hydroxychloroquine (PLAQUENIL) 200 MG tablet Take 2 tablets (400 mg total) by mouth daily. 90 tablet 3   hydroxyurea (HYDREA) 500 MG capsule Take 1 capsule (500 mg total) by mouth daily. May take with food to minimize GI side effects. 90 capsule 1   metoprolol tartrate (LOPRESSOR) 50 MG tablet TAKE 1&1/2 TABLETS BY MOUTH TWICE DAILY. 135 tablet 3   Multiple  Vitamins-Minerals (CENTRUM SILVER) CHEW Chew 1 each by mouth daily.     RESTASIS 0.05 % ophthalmic emulsion Place 1 drop into both eyes 2 (two) times daily.      TURMERIC PO Take by mouth.     atorvastatin (LIPITOR) 20 MG tablet Take 1 tablet (20 mg total) by mouth daily. 90 tablet 3   ibuprofen (ADVIL) 200 MG tablet Take 1 tablet by mouth daily as needed. (Patient not taking: Reported on 04/23/2021)     prochlorperazine (COMPAZINE) 10 MG tablet Take 1 tablet (10 mg total) by mouth every 6 (six) hours as needed for nausea or vomiting. (Patient not taking: Reported on 04/23/2021) 30 tablet 0   No current facility-administered medications for this visit.    SURGICAL HISTORY:  Past Surgical History:  Procedure Laterality Date   ABDOMINAL HYSTERECTOMY     BREAST EXCISIONAL BIOPSY Right    benign   knee replacements      REVIEW OF SYSTEMS:  A comprehensive review of systems was negative.   PHYSICAL EXAMINATION: General appearance: alert, cooperative, and no distress Head: Normocephalic, without obvious abnormality, atraumatic Neck: no adenopathy, no JVD, supple, symmetrical, trachea midline, and thyroid not enlarged, symmetric, no tenderness/mass/nodules Lymph nodes: Cervical, supraclavicular, and axillary nodes normal. Resp: clear to auscultation bilaterally Back: symmetric, no curvature. ROM normal. No CVA tenderness. Cardio: regular rate and rhythm, S1, S2 normal, no murmur, click, rub or gallop GI: soft, non-tender; bowel sounds normal; no masses,  no organomegaly Extremities: extremities normal, atraumatic, no cyanosis or edema  ECOG PERFORMANCE STATUS: 0 - Asymptomatic  Blood pressure 128/73, pulse 65, temperature 97.6 F (36.4 C), temperature source Tympanic, resp. rate 19, height '5\' 7"'$  (1.702 m), weight 172 lb 12.8 oz (78.4 kg), SpO2 100 %.  LABORATORY DATA: Lab Results  Component Value Date   WBC 5.2 04/23/2021   HGB 12.8 04/23/2021   HCT 38.7 04/23/2021   MCV 92.4  04/23/2021   PLT 338 04/23/2021      Chemistry      Component Value Date/Time   NA 142 04/23/2021 1343   NA 142 04/20/2017 1033   K 4.1 04/23/2021 1343   CL 105 04/23/2021 1343   CO2 29 04/23/2021 1343   BUN 15 04/23/2021 1343   BUN 16 04/20/2017 1033   CREATININE 0.98 04/23/2021 1343      Component Value Date/Time   CALCIUM 9.5 04/23/2021 1343   ALKPHOS 78 04/23/2021 1343   AST 39 04/23/2021 1343   ALT 43 04/23/2021 1343   BILITOT 1.0 04/23/2021 1343       RADIOGRAPHIC STUDIES: No results found.  ASSESSMENT AND PLAN: This is a very pleasant 70 years old white female recently diagnosed with essential thrombocythemia as well as iron deficiency anemia.  The patient is currently on treatment with hydroxyurea 1000 mg p.o. alternating with 500 mg every other day in addition to Integra +1 capsule p.o. daily. The patient continues to tolerate her treatment with hydroxyurea fairly well with no concerning adverse effects. Repeat CBC and comprehensive metabolic panel are unremarkable today. I recommended for her to continue with the current dose of hydroxyurea. She will come back for follow-up visit in 2 months with repeat blood work. The patient was advised to call immediately if she has any concerning symptoms in the interval. The patient voices understanding of current disease status and treatment options and is in agreement with the current care plan.  All questions were answered. The patient knows to call the clinic with any problems, questions or concerns. We can certainly see the patient much sooner if necessary.  Disclaimer: This note was dictated with voice recognition software. Similar sounding words can inadvertently be transcribed and may not be corrected upon review.

## 2021-04-24 ENCOUNTER — Other Ambulatory Visit: Payer: Self-pay | Admitting: Medical Oncology

## 2021-04-24 DIAGNOSIS — D473 Essential (hemorrhagic) thrombocythemia: Secondary | ICD-10-CM

## 2021-04-24 MED ORDER — HYDROXYUREA 500 MG PO CAPS
500.0000 mg | ORAL_CAPSULE | ORAL | 1 refills | Status: DC
Start: 2021-04-24 — End: 2021-04-24

## 2021-04-24 MED ORDER — HYDROXYUREA 500 MG PO CAPS
500.0000 mg | ORAL_CAPSULE | ORAL | 1 refills | Status: DC
Start: 2021-04-24 — End: 2021-10-17

## 2021-04-24 MED ORDER — HYDROXYUREA 500 MG PO CAPS: 500.0000 mg | ORAL_CAPSULE | ORAL | 1 refills | Status: DC

## 2021-04-24 NOTE — Addendum Note (Signed)
Addended by: Ardeen Garland on: 04/24/2021 11:15 AM   Modules accepted: Orders

## 2021-05-07 ENCOUNTER — Other Ambulatory Visit: Payer: Self-pay | Admitting: Family Medicine

## 2021-05-07 DIAGNOSIS — H0102B Squamous blepharitis left eye, upper and lower eyelids: Secondary | ICD-10-CM | POA: Diagnosis not present

## 2021-05-07 DIAGNOSIS — Z1231 Encounter for screening mammogram for malignant neoplasm of breast: Secondary | ICD-10-CM

## 2021-05-07 DIAGNOSIS — Z961 Presence of intraocular lens: Secondary | ICD-10-CM | POA: Diagnosis not present

## 2021-05-07 DIAGNOSIS — H16223 Keratoconjunctivitis sicca, not specified as Sjogren's, bilateral: Secondary | ICD-10-CM | POA: Diagnosis not present

## 2021-05-07 DIAGNOSIS — Z79899 Other long term (current) drug therapy: Secondary | ICD-10-CM | POA: Diagnosis not present

## 2021-05-10 ENCOUNTER — Other Ambulatory Visit: Payer: Self-pay

## 2021-05-10 ENCOUNTER — Ambulatory Visit
Admission: RE | Admit: 2021-05-10 | Discharge: 2021-05-10 | Disposition: A | Discharge status: Home / Self Care | Payer: Medicare HMO | Source: Ambulatory Visit | Attending: Family Medicine | Admitting: Family Medicine

## 2021-05-10 DIAGNOSIS — Z1231 Encounter for screening mammogram for malignant neoplasm of breast: Secondary | ICD-10-CM | POA: Diagnosis not present

## 2021-05-11 ENCOUNTER — Other Ambulatory Visit: Payer: Self-pay | Admitting: Internal Medicine

## 2021-05-11 ENCOUNTER — Other Ambulatory Visit: Payer: Self-pay | Admitting: Cardiovascular Disease

## 2021-05-11 DIAGNOSIS — R002 Palpitations: Secondary | ICD-10-CM

## 2021-05-11 DIAGNOSIS — I341 Nonrheumatic mitral (valve) prolapse: Secondary | ICD-10-CM

## 2021-05-11 DIAGNOSIS — G4733 Obstructive sleep apnea (adult) (pediatric): Secondary | ICD-10-CM

## 2021-05-11 DIAGNOSIS — D75839 Thrombocytosis, unspecified: Secondary | ICD-10-CM

## 2021-05-12 DIAGNOSIS — M25471 Effusion, right ankle: Secondary | ICD-10-CM | POA: Diagnosis not present

## 2021-05-12 DIAGNOSIS — M2550 Pain in unspecified joint: Secondary | ICD-10-CM | POA: Diagnosis not present

## 2021-05-12 DIAGNOSIS — E663 Overweight: Secondary | ICD-10-CM | POA: Diagnosis not present

## 2021-05-12 DIAGNOSIS — D473 Essential (hemorrhagic) thrombocythemia: Secondary | ICD-10-CM | POA: Diagnosis not present

## 2021-05-12 DIAGNOSIS — M8589 Other specified disorders of bone density and structure, multiple sites: Secondary | ICD-10-CM | POA: Diagnosis not present

## 2021-05-12 DIAGNOSIS — M150 Primary generalized (osteo)arthritis: Secondary | ICD-10-CM | POA: Diagnosis not present

## 2021-05-12 DIAGNOSIS — R5383 Other fatigue: Secondary | ICD-10-CM | POA: Diagnosis not present

## 2021-05-12 DIAGNOSIS — Z6828 Body mass index (BMI) 28.0-28.9, adult: Secondary | ICD-10-CM | POA: Diagnosis not present

## 2021-05-12 DIAGNOSIS — M0579 Rheumatoid arthritis with rheumatoid factor of multiple sites without organ or systems involvement: Secondary | ICD-10-CM | POA: Diagnosis not present

## 2021-05-14 DIAGNOSIS — M069 Rheumatoid arthritis, unspecified: Secondary | ICD-10-CM | POA: Diagnosis not present

## 2021-05-14 DIAGNOSIS — E782 Mixed hyperlipidemia: Secondary | ICD-10-CM | POA: Diagnosis not present

## 2021-05-14 DIAGNOSIS — D509 Iron deficiency anemia, unspecified: Secondary | ICD-10-CM | POA: Diagnosis not present

## 2021-05-15 DIAGNOSIS — M069 Rheumatoid arthritis, unspecified: Secondary | ICD-10-CM | POA: Diagnosis not present

## 2021-05-15 DIAGNOSIS — E782 Mixed hyperlipidemia: Secondary | ICD-10-CM | POA: Diagnosis not present

## 2021-05-15 DIAGNOSIS — D509 Iron deficiency anemia, unspecified: Secondary | ICD-10-CM | POA: Diagnosis not present

## 2021-06-24 ENCOUNTER — Other Ambulatory Visit: Payer: Self-pay

## 2021-06-24 ENCOUNTER — Inpatient Hospital Stay: Payer: Medicare HMO

## 2021-06-24 ENCOUNTER — Inpatient Hospital Stay: Payer: Medicare HMO | Attending: Internal Medicine | Admitting: Internal Medicine

## 2021-06-24 ENCOUNTER — Encounter: Payer: Self-pay | Admitting: Internal Medicine

## 2021-06-24 VITALS — BP 124/78 | HR 64 | Temp 97.7°F | Resp 19 | Ht 67.0 in | Wt 176.0 lb

## 2021-06-24 DIAGNOSIS — Z79899 Other long term (current) drug therapy: Secondary | ICD-10-CM | POA: Diagnosis not present

## 2021-06-24 DIAGNOSIS — D473 Essential (hemorrhagic) thrombocythemia: Secondary | ICD-10-CM

## 2021-06-24 DIAGNOSIS — D509 Iron deficiency anemia, unspecified: Secondary | ICD-10-CM | POA: Insufficient documentation

## 2021-06-24 LAB — CBC WITH DIFFERENTIAL (CANCER CENTER ONLY)
Abs Immature Granulocytes: 0.01 10*3/uL (ref 0.00–0.07)
Basophils Absolute: 0.1 10*3/uL (ref 0.0–0.1)
Basophils Relative: 1 %
Eosinophils Absolute: 0.2 10*3/uL (ref 0.0–0.5)
Eosinophils Relative: 3 %
HCT: 40.2 % (ref 36.0–46.0)
Hemoglobin: 12.9 g/dL (ref 12.0–15.0)
Immature Granulocytes: 0 %
Lymphocytes Relative: 22 %
Lymphs Abs: 1 10*3/uL (ref 0.7–4.0)
MCH: 29.9 pg (ref 26.0–34.0)
MCHC: 32.1 g/dL (ref 30.0–36.0)
MCV: 93.1 fL (ref 80.0–100.0)
Monocytes Absolute: 0.5 10*3/uL (ref 0.1–1.0)
Monocytes Relative: 10 %
Neutro Abs: 3 10*3/uL (ref 1.7–7.7)
Neutrophils Relative %: 64 %
Platelet Count: 360 10*3/uL (ref 150–400)
RBC: 4.32 MIL/uL (ref 3.87–5.11)
RDW: 13.8 % (ref 11.5–15.5)
WBC Count: 4.7 10*3/uL (ref 4.0–10.5)
nRBC: 0 % (ref 0.0–0.2)

## 2021-06-24 LAB — CMP (CANCER CENTER ONLY)
ALT: 21 U/L (ref 0–44)
AST: 29 U/L (ref 15–41)
Albumin: 4.1 g/dL (ref 3.5–5.0)
Alkaline Phosphatase: 67 U/L (ref 38–126)
Anion gap: 9 (ref 5–15)
BUN: 19 mg/dL (ref 8–23)
CO2: 26 mmol/L (ref 22–32)
Calcium: 9.3 mg/dL (ref 8.9–10.3)
Chloride: 106 mmol/L (ref 98–111)
Creatinine: 1.05 mg/dL — ABNORMAL HIGH (ref 0.44–1.00)
GFR, Estimated: 57 mL/min — ABNORMAL LOW (ref >60)
Glucose, Bld: 79 mg/dL (ref 70–99)
Potassium: 4.5 mmol/L (ref 3.5–5.1)
Sodium: 141 mmol/L (ref 135–145)
Total Bilirubin: 1 mg/dL (ref 0.3–1.2)
Total Protein: 6.8 g/dL (ref 6.5–8.1)

## 2021-06-24 LAB — LACTATE DEHYDROGENASE: LDH: 265 U/L — ABNORMAL HIGH (ref 98–192)

## 2021-06-24 NOTE — Progress Notes (Signed)
[facility] [phone]   [phone]  OFFICE PROGRESS NOTE  [name], MD [address] [place] [address]  DIAGNOSIS: Essential Thrombocythemia. [name] 2 mutation positive.    PRIOR THERAPY: None   CURRENT THERAPY: 1) Hydroxyurea 1000 mg every day. First dose on 07/03/20 started as 500 mg p.o. daily. 2) Integra plus 1 tablet p.o. daily    INTERVAL HISTORY: Ruth Sharp 70 y.o. female returns to the clinic today for follow-up visit.  The patient has no complaints today.  She has been tolerating her treatment with hydroxyurea fairly well with no significant adverse effects.  She denied having any current chest pain, shortness of breath, cough or hemoptysis.  She denied having any fever or chills.  She has no nausea, vomiting, diarrhea or constipation.  She has no headache or visual changes.  She is here today for evaluation and repeat blood work.  MEDICAL HISTORY: Past Medical History:  Diagnosis Date   Arthritis    Tachycardia     ALLERGIES:  is allergic to keflex [cephalexin] and tape.  MEDICATIONS:  Current Outpatient Medications  Medication Sig Dispense Refill   Bacillus Coagulans-Inulin (PROBIOTIC) 1-250 BILLION-MG CAPS Take 1 capsule by mouth daily. Pt states uses Kristopher Oppenheim brand     buPROPion (WELLBUTRIN) 75 MG tablet Take 75 mg by mouth 2 (two) times daily.     Calcium Carbonate-Vitamin D (CALTRATE 600+D PO) Take 1 tablet by mouth daily.     FeFum-FePoly-FA-B Cmp-C-Biot (FOLIVANE-PLUS) CAPS TAKE ONE CAPSULE BY MOUTH ONCE DAILY 90 capsule 1   hydroxychloroquine (PLAQUENIL) 200 MG tablet Take 2 tablets (400 mg total) by mouth daily. 90 tablet 3   hydroxyurea (HYDREA) 500 MG capsule Take 1 capsule (500 mg total) by mouth as directed. Take hydroxyurea 1000 mg p.o. alternating with 500 mg every other day May take with food to minimize GI side effects.  May take with food to minimize GI side effects. 180 capsule 1    ibuprofen (ADVIL) 200 MG tablet Take 1 tablet by mouth daily as needed.     metoprolol tartrate (LOPRESSOR) 50 MG tablet TAKE 1 AND 1/2 TABLETS BY MOUTH TWICE DAILY 270 tablet 0   Multiple Vitamins-Minerals (CENTRUM SILVER) CHEW Chew 1 each by mouth daily.     RESTASIS 0.05 % ophthalmic emulsion Place 1 drop into both eyes 2 (two) times daily.      TURMERIC PO Take by mouth.     atorvastatin (LIPITOR) 20 MG tablet Take 1 tablet (20 mg total) by mouth daily. 90 tablet 3   prochlorperazine (COMPAZINE) 10 MG tablet Take 1 tablet (10 mg total) by mouth every 6 (six) hours as needed for nausea or vomiting. (Patient not taking: No sig reported) 30 tablet 0   No current facility-administered medications for this visit.    SURGICAL HISTORY:  Past Surgical History:  Procedure Laterality Date   ABDOMINAL HYSTERECTOMY     BREAST EXCISIONAL BIOPSY Right    benign   knee replacements      REVIEW OF SYSTEMS:  A comprehensive review of systems was negative.   PHYSICAL EXAMINATION: General appearance: alert, cooperative, and no distress Head: Normocephalic, without obvious abnormality, atraumatic Neck: no adenopathy, no JVD, supple, symmetrical, trachea midline, and thyroid not enlarged, symmetric, no tenderness/mass/nodules Lymph nodes: Cervical, supraclavicular, and axillary nodes normal. Resp: clear to auscultation bilaterally Back: symmetric, no curvature. ROM normal. No CVA tenderness. Cardio: regular rate and rhythm, S1, S2 normal,  no murmur, click, rub or gallop GI: soft, non-tender; bowel sounds normal; no masses,  no organomegaly Extremities: extremities normal, atraumatic, no cyanosis or edema  ECOG PERFORMANCE STATUS: 0 - Asymptomatic  Blood pressure 124/78, pulse 64, temperature 97.7 F (36.5 C), temperature source Tympanic, resp. rate 19, height 5\' 7"  (1.702 m), weight 176 lb (79.8 kg), SpO2 99 %.  LABORATORY DATA: Lab Results  Component Value Date   WBC 4.7 06/24/2021   HGB  12.9 06/24/2021   HCT 40.2 06/24/2021   MCV 93.1 06/24/2021   PLT 360 06/24/2021      Chemistry      Component Value Date/Time   NA 141 06/24/2021 1052   NA 142 04/20/2017 1033   K 4.5 06/24/2021 1052   CL 106 06/24/2021 1052   CO2 26 06/24/2021 1052   BUN 19 06/24/2021 1052   BUN 16 04/20/2017 1033   CREATININE 1.05 (H) 06/24/2021 1052      Component Value Date/Time   CALCIUM 9.3 06/24/2021 1052   ALKPHOS 67 06/24/2021 1052   AST 29 06/24/2021 1052   ALT 21 06/24/2021 1052   BILITOT 1.0 06/24/2021 1052       RADIOGRAPHIC STUDIES: No results found.  ASSESSMENT AND PLAN: This is a very pleasant 70 years old white female recently diagnosed with essential thrombocythemia as well as iron deficiency anemia.  The patient is currently on treatment with hydroxyurea 1000 mg p.o. alternating with 500 mg every other day in addition to Integra +1 capsule p.o. daily. She has been tolerating this treatment well with no concerning adverse effects. Repeat CBC, comprehensive metabolic panel are unremarkable today. I recommended for the patient to continue on the current dose of her treatment 1000 mg alternating with 500 mg every other day. I will see her back for follow-up visit in 6 weeks for evaluation and repeat blood work. She was advised to call immediately if she has any other concerning symptoms in the interval.  The patient voices understanding of current disease status and treatment options and is in agreement with the current care plan.  All questions were answered. The patient knows to call the clinic with any problems, questions or concerns. We can certainly see the patient much sooner if necessary.  Disclaimer: This note was dictated with voice recognition software. Similar sounding words can inadvertently be transcribed and may not be corrected upon review.

## 2021-07-30 ENCOUNTER — Ambulatory Visit: Payer: Medicare HMO | Admitting: Cardiovascular Disease

## 2021-08-12 DIAGNOSIS — M069 Rheumatoid arthritis, unspecified: Secondary | ICD-10-CM | POA: Diagnosis not present

## 2021-08-12 DIAGNOSIS — D509 Iron deficiency anemia, unspecified: Secondary | ICD-10-CM | POA: Diagnosis not present

## 2021-08-12 DIAGNOSIS — E782 Mixed hyperlipidemia: Secondary | ICD-10-CM | POA: Diagnosis not present

## 2021-08-18 ENCOUNTER — Other Ambulatory Visit: Payer: Self-pay | Admitting: Cardiovascular Disease

## 2021-08-18 DIAGNOSIS — R002 Palpitations: Secondary | ICD-10-CM

## 2021-08-18 DIAGNOSIS — G4733 Obstructive sleep apnea (adult) (pediatric): Secondary | ICD-10-CM

## 2021-08-18 DIAGNOSIS — I341 Nonrheumatic mitral (valve) prolapse: Secondary | ICD-10-CM

## 2021-08-21 DIAGNOSIS — Z6826 Body mass index (BMI) 26.0-26.9, adult: Secondary | ICD-10-CM | POA: Diagnosis not present

## 2021-08-21 DIAGNOSIS — R059 Cough, unspecified: Secondary | ICD-10-CM | POA: Diagnosis not present

## 2021-08-21 DIAGNOSIS — R5383 Other fatigue: Secondary | ICD-10-CM | POA: Diagnosis not present

## 2021-08-21 DIAGNOSIS — U071 COVID-19: Secondary | ICD-10-CM | POA: Diagnosis not present

## 2021-08-28 ENCOUNTER — Other Ambulatory Visit: Payer: Self-pay

## 2021-08-28 ENCOUNTER — Telehealth: Payer: Self-pay | Admitting: Cardiovascular Disease

## 2021-08-28 DIAGNOSIS — R002 Palpitations: Secondary | ICD-10-CM

## 2021-08-28 DIAGNOSIS — G4733 Obstructive sleep apnea (adult) (pediatric): Secondary | ICD-10-CM

## 2021-08-28 DIAGNOSIS — I341 Nonrheumatic mitral (valve) prolapse: Secondary | ICD-10-CM

## 2021-08-28 MED ORDER — METOPROLOL TARTRATE 50 MG PO TABS: 75.0000 mg | ORAL_TABLET | Freq: Two times a day (BID) | ORAL | 0 refills | Status: DC

## 2021-08-28 MED ORDER — ATORVASTATIN CALCIUM 20 MG PO TABS: 20.0000 mg | ORAL_TABLET | Freq: Every day | ORAL | 0 refills | Status: DC

## 2021-08-28 NOTE — Telephone Encounter (Signed)
°*  STAT* If patient is at the pharmacy, call can be transferred to refill team.   1. Which medications need to be refilled? (please list name of each medication and dose if known) metoprolol tartrate (LOPRESSOR) 50 MG tablet  atorvastatin (LIPITOR) 20 MG tablet  2. Which pharmacy/location (including street and city if local pharmacy) is medication to be sent to? [facility] - [place], [place] - [place] [address]  3. Do they need a 30 day or 90 day supply? [place]

## 2021-08-28 NOTE — Telephone Encounter (Signed)
RX sent to pharmacy- patient needs to keep appointment scheduled for 11/04/21

## 2021-10-03 ENCOUNTER — Telehealth: Payer: Self-pay | Admitting: Cardiovascular Disease

## 2021-10-03 DIAGNOSIS — F411 Generalized anxiety disorder: Secondary | ICD-10-CM | POA: Diagnosis not present

## 2021-10-03 DIAGNOSIS — R69 Illness, unspecified: Secondary | ICD-10-CM | POA: Diagnosis not present

## 2021-10-03 MED ORDER — ATORVASTATIN CALCIUM 20 MG PO TABS: 20.0000 mg | ORAL_TABLET | Freq: Every day | ORAL | 0 refills | Status: DC

## 2021-10-03 NOTE — Telephone Encounter (Signed)
°*  STAT* If patient is at the pharmacy, call can be transferred to refill team.   1. Which medications need to be refilled? (please list name of each medication and dose if known)  atorvastatin (LIPITOR) 20 MG tablet  2. Which pharmacy/location (including street and city if local pharmacy) is medication to be sent to? [facility] - [place], [place] - [place] [address]  3. Do they need a 30 day or 90 day supply? 90 with refills  Patient is scheduled to see Dr.Berry 11/04/21

## 2021-10-03 NOTE — Telephone Encounter (Signed)
Refill for 90 days sent to [facility]. Patient needs to keep upcoming appt for future refills.

## 2021-10-09 DIAGNOSIS — Z1389 Encounter for screening for other disorder: Secondary | ICD-10-CM | POA: Diagnosis not present

## 2021-10-09 DIAGNOSIS — Z0000 Encounter for general adult medical examination without abnormal findings: Secondary | ICD-10-CM | POA: Diagnosis not present

## 2021-10-15 DIAGNOSIS — Z872 Personal history of diseases of the skin and subcutaneous tissue: Secondary | ICD-10-CM | POA: Diagnosis not present

## 2021-10-15 DIAGNOSIS — D225 Melanocytic nevi of trunk: Secondary | ICD-10-CM | POA: Diagnosis not present

## 2021-10-15 DIAGNOSIS — Z86018 Personal history of other benign neoplasm: Secondary | ICD-10-CM | POA: Diagnosis not present

## 2021-10-15 DIAGNOSIS — L821 Other seborrheic keratosis: Secondary | ICD-10-CM | POA: Diagnosis not present

## 2021-10-15 DIAGNOSIS — D2272 Melanocytic nevi of left lower limb, including hip: Secondary | ICD-10-CM | POA: Diagnosis not present

## 2021-10-15 DIAGNOSIS — D1801 Hemangioma of skin and subcutaneous tissue: Secondary | ICD-10-CM | POA: Diagnosis not present

## 2021-10-15 DIAGNOSIS — D2239 Melanocytic nevi of other parts of face: Secondary | ICD-10-CM | POA: Diagnosis not present

## 2021-10-15 DIAGNOSIS — Z23 Encounter for immunization: Secondary | ICD-10-CM | POA: Diagnosis not present

## 2021-10-15 DIAGNOSIS — Z85828 Personal history of other malignant neoplasm of skin: Secondary | ICD-10-CM | POA: Diagnosis not present

## 2021-10-15 DIAGNOSIS — D2230 Melanocytic nevi of unspecified part of face: Secondary | ICD-10-CM | POA: Diagnosis not present

## 2021-10-15 DIAGNOSIS — L578 Other skin changes due to chronic exposure to nonionizing radiation: Secondary | ICD-10-CM | POA: Diagnosis not present

## 2021-10-16 ENCOUNTER — Other Ambulatory Visit: Payer: Self-pay | Admitting: Internal Medicine

## 2021-10-16 DIAGNOSIS — D473 Essential (hemorrhagic) thrombocythemia: Secondary | ICD-10-CM

## 2021-10-20 ENCOUNTER — Inpatient Hospital Stay: Payer: Medicare HMO | Attending: Internal Medicine | Admitting: Internal Medicine

## 2021-10-20 ENCOUNTER — Other Ambulatory Visit: Payer: Self-pay

## 2021-10-20 ENCOUNTER — Inpatient Hospital Stay: Payer: Medicare HMO

## 2021-10-20 VITALS — BP 118/61 | HR 69 | Temp 97.1°F | Resp 18 | Ht 67.0 in | Wt 179.3 lb

## 2021-10-20 DIAGNOSIS — D473 Essential (hemorrhagic) thrombocythemia: Secondary | ICD-10-CM | POA: Insufficient documentation

## 2021-10-20 DIAGNOSIS — Z79899 Other long term (current) drug therapy: Secondary | ICD-10-CM | POA: Insufficient documentation

## 2021-10-20 DIAGNOSIS — D509 Iron deficiency anemia, unspecified: Secondary | ICD-10-CM | POA: Diagnosis not present

## 2021-10-20 LAB — CMP (CANCER CENTER ONLY)
ALT: 20 U/L (ref 0–44)
AST: 26 U/L (ref 15–41)
Albumin: 4.3 g/dL (ref 3.5–5.0)
Alkaline Phosphatase: 59 U/L (ref 38–126)
Anion gap: 7 (ref 5–15)
BUN: 19 mg/dL (ref 8–23)
CO2: 29 mmol/L (ref 22–32)
Calcium: 9.2 mg/dL (ref 8.9–10.3)
Chloride: 106 mmol/L (ref 98–111)
Creatinine: 1.2 mg/dL — ABNORMAL HIGH (ref 0.44–1.00)
GFR, Estimated: 48 mL/min — ABNORMAL LOW (ref >60)
Glucose, Bld: 76 mg/dL (ref 70–99)
Potassium: 4.1 mmol/L (ref 3.5–5.1)
Sodium: 142 mmol/L (ref 135–145)
Total Bilirubin: 1 mg/dL (ref 0.3–1.2)
Total Protein: 6.7 g/dL (ref 6.5–8.1)

## 2021-10-20 LAB — CBC WITH DIFFERENTIAL (CANCER CENTER ONLY)
Abs Immature Granulocytes: 0 10*3/uL (ref 0.00–0.07)
Basophils Absolute: 0.1 10*3/uL (ref 0.0–0.1)
Basophils Relative: 1 %
Eosinophils Absolute: 0.1 10*3/uL (ref 0.0–0.5)
Eosinophils Relative: 2 %
HCT: 39.8 % (ref 36.0–46.0)
Hemoglobin: 12.8 g/dL (ref 12.0–15.0)
Immature Granulocytes: 0 %
Lymphocytes Relative: 26 %
Lymphs Abs: 1.3 10*3/uL (ref 0.7–4.0)
MCH: 29.7 pg (ref 26.0–34.0)
MCHC: 32.2 g/dL (ref 30.0–36.0)
MCV: 92.3 fL (ref 80.0–100.0)
Monocytes Absolute: 0.5 10*3/uL (ref 0.1–1.0)
Monocytes Relative: 9 %
Neutro Abs: 3.1 10*3/uL (ref 1.7–7.7)
Neutrophils Relative %: 62 %
Platelet Count: 412 10*3/uL — ABNORMAL HIGH (ref 150–400)
RBC: 4.31 MIL/uL (ref 3.87–5.11)
RDW: 14.7 % (ref 11.5–15.5)
WBC Count: 5 10*3/uL (ref 4.0–10.5)
nRBC: 0 % (ref 0.0–0.2)

## 2021-10-20 LAB — LACTATE DEHYDROGENASE: LDH: 218 U/L — ABNORMAL HIGH (ref 98–192)

## 2021-10-20 NOTE — Progress Notes (Signed)
[facility] Telephone:(336) [phone]   Fax:(336) [phone]  OFFICE PROGRESS NOTE  [name], MD [address] [place] [address]  DIAGNOSIS: Essential Thrombocythemia. [name] 2 mutation positive.    PRIOR THERAPY: None   CURRENT THERAPY: 1) Hydroxyurea 1000 mg alternating with 500 mg every other day . First dose on 07/03/20 started as 500 mg p.o. daily. 2) Integra plus 1 tablet p.o. daily    INTERVAL HISTORY: Ruth Sharp 71 y.o. female returns to the clinic today for follow-up visit.  The patient is feeling fine today with no concerning complaints.  She has been tolerating her treatment with hydroxyurea fairly well.  She denied having any chest pain, shortness of breath, cough or hemoptysis.  She has no nausea, vomiting, diarrhea or constipation.  She has no headache or visual changes.  She denied having any significant weight loss or night sweats.  She is here today for evaluation and repeat blood work.  MEDICAL HISTORY: Past Medical History:  Diagnosis Date   Arthritis    Tachycardia     ALLERGIES:  is allergic to keflex [cephalexin] and tape.  MEDICATIONS:  Current Outpatient Medications  Medication Sig Dispense Refill   atorvastatin (LIPITOR) 20 MG tablet Take 1 tablet (20 mg total) by mouth daily. 90 tablet 0   Bacillus Coagulans-Inulin (PROBIOTIC) 1-250 BILLION-MG CAPS Take 1 capsule by mouth daily. Pt states uses Kristopher Oppenheim brand     buPROPion (WELLBUTRIN) 75 MG tablet Take 75 mg by mouth 2 (two) times daily.     Calcium Carbonate-Vitamin D (CALTRATE 600+D PO) Take 1 tablet by mouth daily.     FeFum-FePoly-FA-B Cmp-C-Biot (FOLIVANE-PLUS) CAPS TAKE ONE CAPSULE BY MOUTH ONCE DAILY 90 capsule 1   hydroxychloroquine (PLAQUENIL) 200 MG tablet Take 2 tablets (400 mg total) by mouth daily. 90 tablet 3   hydroxyurea (HYDREA) 500 MG capsule TAKE ONE CAPSULE BY MOUTH EVERY OTHER DAY alternating with TWO capsules as directed. MAY take with food TO  minimize GI side effects. 180 capsule 2   ibuprofen (ADVIL) 200 MG tablet Take 1 tablet by mouth daily as needed.     metoprolol tartrate (LOPRESSOR) 50 MG tablet Take 1.5 tablets (75 mg total) by mouth 2 (two) times daily. 90 tablet 0   Multiple Vitamins-Minerals (CENTRUM SILVER) CHEW Chew 1 each by mouth daily.     prochlorperazine (COMPAZINE) 10 MG tablet Take 1 tablet (10 mg total) by mouth every 6 (six) hours as needed for nausea or vomiting. (Patient not taking: No sig reported) 30 tablet 0   RESTASIS 0.05 % ophthalmic emulsion Place 1 drop into both eyes 2 (two) times daily.      TURMERIC PO Take by mouth.     No current facility-administered medications for this visit.    SURGICAL HISTORY:  Past Surgical History:  Procedure Laterality Date   ABDOMINAL HYSTERECTOMY     BREAST EXCISIONAL BIOPSY Right    benign   knee replacements      REVIEW OF SYSTEMS:  A comprehensive review of systems was negative.   PHYSICAL EXAMINATION: General appearance: alert, cooperative, and no distress Head: Normocephalic, without obvious abnormality, atraumatic Neck: no adenopathy, no JVD, supple, symmetrical, trachea midline, and thyroid not enlarged, symmetric, no tenderness/mass/nodules Lymph nodes: Cervical, supraclavicular, and axillary nodes normal. Resp: clear to auscultation bilaterally Back: symmetric, no curvature. ROM normal. No CVA tenderness. Cardio: regular rate and rhythm, S1, S2 normal, no murmur, click, rub or gallop GI: soft,  non-tender; bowel sounds normal; no masses,  no organomegaly Extremities: extremities normal, atraumatic, no cyanosis or edema  ECOG PERFORMANCE STATUS: 0 - Asymptomatic  Blood pressure 118/61, pulse 69, temperature (!) 97.1 F (36.2 C), temperature source Tympanic, resp. rate 18, height 5\' 7"  (1.702 m), weight 179 lb 4.8 oz (81.3 kg), SpO2 98 %.  LABORATORY DATA: Lab Results  Component Value Date   WBC 4.7 06/24/2021   HGB 12.9 06/24/2021   HCT 40.2  06/24/2021   MCV 93.1 06/24/2021   PLT 360 06/24/2021      Chemistry      Component Value Date/Time   NA 141 06/24/2021 1052   NA 142 04/20/2017 1033   K 4.5 06/24/2021 1052   CL 106 06/24/2021 1052   CO2 26 06/24/2021 1052   BUN 19 06/24/2021 1052   BUN 16 04/20/2017 1033   CREATININE 1.05 (H) 06/24/2021 1052      Component Value Date/Time   CALCIUM 9.3 06/24/2021 1052   ALKPHOS 67 06/24/2021 1052   AST 29 06/24/2021 1052   ALT 21 06/24/2021 1052   BILITOT 1.0 06/24/2021 1052       RADIOGRAPHIC STUDIES: No results found.  ASSESSMENT AND PLAN: This is a very pleasant 71 years old white female recently diagnosed with essential thrombocythemia as well as iron deficiency anemia.  The patient is currently on treatment with hydroxyurea 1000 mg p.o. alternating with 500 mg every other day in addition to Integra +1 capsule p.o. daily. The patient has been tolerating this treatment well with no concerning adverse effects. I recommended for her to continue her current dose of hydroxyurea. She will have repeat blood work today and if there is no concerning findings she will stay on the same dose. I will see her back for follow-up visit in 3 months for evaluation and repeat blood work. She was advised to call immediately if she has any other concerning symptoms in the interval.  The patient voices understanding of current disease status and treatment options and is in agreement with the current care plan.  All questions were answered. The patient knows to call the clinic with any problems, questions or concerns. We can certainly see the patient much sooner if necessary.  Disclaimer: This note was dictated with voice recognition software. Similar sounding words can inadvertently be transcribed and may not be corrected upon review.

## 2021-11-04 ENCOUNTER — Other Ambulatory Visit: Payer: Self-pay

## 2021-11-04 ENCOUNTER — Ambulatory Visit: Payer: Medicare HMO | Admitting: Cardiovascular Disease

## 2021-11-04 ENCOUNTER — Encounter: Payer: Self-pay | Admitting: Cardiovascular Disease

## 2021-11-04 VITALS — BP 106/62 | HR 64 | Ht 67.0 in | Wt 181.0 lb

## 2021-11-04 DIAGNOSIS — R002 Palpitations: Secondary | ICD-10-CM

## 2021-11-04 DIAGNOSIS — I341 Nonrheumatic mitral (valve) prolapse: Secondary | ICD-10-CM

## 2021-11-04 DIAGNOSIS — E7800 Pure hypercholesterolemia, unspecified: Secondary | ICD-10-CM | POA: Diagnosis not present

## 2021-11-04 DIAGNOSIS — G4733 Obstructive sleep apnea (adult) (pediatric): Secondary | ICD-10-CM

## 2021-11-04 DIAGNOSIS — E782 Mixed hyperlipidemia: Secondary | ICD-10-CM | POA: Diagnosis not present

## 2021-11-04 MED ORDER — ATORVASTATIN CALCIUM 20 MG PO TABS: 20.0000 mg | ORAL_TABLET | Freq: Every day | ORAL | 4 refills | Status: DC

## 2021-11-04 MED ORDER — METOPROLOL TARTRATE 50 MG PO TABS: 75.0000 mg | ORAL_TABLET | Freq: Two times a day (BID) | ORAL | 4 refills | Status: DC

## 2021-11-04 NOTE — Patient Instructions (Signed)

## 2021-11-04 NOTE — Progress Notes (Signed)
11/04/2021 Ruth Sharp   10-20-1950  [id number]  Primary Physician [name], MD Primary Cardiologist: Lorretta Harp MD FACP, [place], [place], [place]  HPI:  Ruth Sharp is a 71 y.o.  separated, mother of one child with no grandchildren who worked in a gym in the past and recently relocated from [place] down to [place] to be closer to family. She had been seeing cardiology group in [place].  I last saw her in the office 05/28/2020.  She has a history of palpitations thought to be related to PACs on event monitor on low-dose beta blocker she had a 2D echo performed 05/04/2017 that revealed normal LV systolic function, grade 2 diastolic dysfunction and mild MR without prolapse.  She did have symptoms of obstructive sleep apnea as well, and apparently had a positive sleep study but has not pursued CPAP.   I saw her a year ago she did well.  She denies chest pain or shortness of breath.  She has developed thrombocytosis and has been seeing Dr. Earlie Server and being treated with hydroxyurea.   Current Meds  Medication Sig   atorvastatin (LIPITOR) 20 MG tablet Take 1 tablet (20 mg total) by mouth daily.   Bacillus Coagulans-Inulin (PROBIOTIC) 1-250 BILLION-MG CAPS Take 1 capsule by mouth daily. Pt states uses Kristopher Oppenheim brand   buPROPion (WELLBUTRIN) 75 MG tablet Take 75 mg by mouth 2 (two) times daily.   Calcium Carbonate-Vitamin D (CALTRATE 600+D PO) Take 1 tablet by mouth daily.   FeFum-FePoly-FA-B Cmp-C-Biot (FOLIVANE-PLUS) CAPS TAKE ONE CAPSULE BY MOUTH ONCE DAILY   hydroxychloroquine (PLAQUENIL) 200 MG tablet Take 2 tablets (400 mg total) by mouth daily.   hydroxyurea (HYDREA) 500 MG capsule TAKE ONE CAPSULE BY MOUTH EVERY OTHER DAY alternating with TWO capsules as directed. MAY take with food TO minimize GI side effects.   ibuprofen (ADVIL) 200 MG tablet Take 1 tablet by mouth daily as needed.   metoprolol tartrate (LOPRESSOR) 50 MG tablet Take 1.5 tablets (75 mg  total) by mouth 2 (two) times daily.   Multiple Vitamins-Minerals (CENTRUM SILVER) CHEW Chew 1 each by mouth daily.   prochlorperazine (COMPAZINE) 10 MG tablet Take 1 tablet (10 mg total) by mouth every 6 (six) hours as needed for nausea or vomiting.   RESTASIS 0.05 % ophthalmic emulsion Place 1 drop into both eyes 2 (two) times daily.    TURMERIC PO Take by mouth.     Allergies  Allergen Reactions   Keflex [Cephalexin] Rash   Tape Rash    Social History   Socioeconomic History   Marital status: Divorced    Spouse name: Not on file   Number of children: Not on file   Years of education: Not on file   Highest education level: Not on file  Occupational History   Not on file  Tobacco Use   Smoking status: Never   Smokeless tobacco: Never  Vaping Use   Vaping Use: Never used  Substance and Sexual Activity   Alcohol use: Yes    Comment: occasionally   Drug use: Never   Sexual activity: Not on file  Other Topics Concern   Not on file  Social History Narrative   Not on file   Social Determinants of Health   Financial Resource Strain: Not on file  Food Insecurity: Not on file  Transportation Needs: Not on file  Physical Activity: Not on file  Stress: Not on file  Social Connections: Not on file  Intimate Partner Violence: Not on file     Review of Systems: General: negative for chills, fever, night sweats or weight changes.  Cardiovascular: negative for chest pain, dyspnea on exertion, edema, orthopnea, palpitations, paroxysmal nocturnal dyspnea or shortness of breath Dermatological: negative for rash Respiratory: negative for cough or wheezing Urologic: negative for hematuria Abdominal: negative for nausea, vomiting, diarrhea, bright red blood per rectum, melena, or hematemesis Neurologic: negative for visual changes, syncope, or dizziness All other systems reviewed and are otherwise negative except as noted above.    Blood pressure 106/62, pulse 64, height 5\' 7"   (1.702 m), weight 181 lb (82.1 kg).  General appearance: alert and no distress Neck: no adenopathy, no carotid bruit, no JVD, supple, symmetrical, trachea midline, and thyroid not enlarged, symmetric, no tenderness/mass/nodules Lungs: clear to auscultation bilaterally Heart: regular rate and rhythm, S1, S2 normal, no murmur, click, rub or gallop Extremities: extremities normal, atraumatic, no cyanosis or edema Pulses: 2+ and symmetric Skin: Skin color, texture, turgor normal. No rashes or lesions Neurologic: Grossly normal  EKG sinus rhythm at 64 without ST or T wave changes.  Personally reviewed this EKG.  ASSESSMENT AND PLAN:   Mitral valve prolapse Her last 2D echo performed 05/04/2017 so it showed mild MR without evidence of prolapse and normal LV function.  Obstructive sleep apnea History of obstructive sleep apnea not on CPAP  Palpitations History of palpitations in the past with event monitor showing PACs improved on low-dose beta-blocker.  Hyperlipidemia History of hyperlipidemia on low-dose statin therapy with lipid profile performed 08/05/2020 revealing total cholesterol 120, LDL 42 and HDL 64.     Lorretta Harp MD [facility], [facility] 11/04/2021 3:24 PM

## 2021-11-04 NOTE — Assessment & Plan Note (Signed)
Her last 2D echo performed 05/04/2017 so it showed mild MR without evidence of prolapse and normal LV function.

## 2021-11-04 NOTE — Assessment & Plan Note (Addendum)
History of palpitations in the past with event monitor showing PACs improved on low-dose beta-blocker.

## 2021-11-04 NOTE — Assessment & Plan Note (Signed)
History of obstructive sleep apnea not on CPAP. 

## 2021-11-04 NOTE — Assessment & Plan Note (Signed)
History of hyperlipidemia on low-dose statin therapy with lipid profile performed 08/05/2020 revealing total cholesterol 120, LDL 42 and HDL 64.

## 2021-11-04 NOTE — Addendum Note (Signed)
Addended by: Beatrix Fetters on: 11/04/2021 03:40 PM   Modules accepted: Orders

## 2021-11-06 ENCOUNTER — Other Ambulatory Visit: Payer: Self-pay | Admitting: Physician Assistant

## 2021-11-06 DIAGNOSIS — D75839 Thrombocytosis, unspecified: Secondary | ICD-10-CM

## 2021-11-11 DIAGNOSIS — M150 Primary generalized (osteo)arthritis: Secondary | ICD-10-CM | POA: Diagnosis not present

## 2021-11-11 DIAGNOSIS — M0579 Rheumatoid arthritis with rheumatoid factor of multiple sites without organ or systems involvement: Secondary | ICD-10-CM | POA: Diagnosis not present

## 2021-11-11 DIAGNOSIS — R5383 Other fatigue: Secondary | ICD-10-CM | POA: Diagnosis not present

## 2021-11-11 DIAGNOSIS — M8589 Other specified disorders of bone density and structure, multiple sites: Secondary | ICD-10-CM | POA: Diagnosis not present

## 2021-11-11 DIAGNOSIS — D473 Essential (hemorrhagic) thrombocythemia: Secondary | ICD-10-CM | POA: Diagnosis not present

## 2021-11-11 DIAGNOSIS — M2550 Pain in unspecified joint: Secondary | ICD-10-CM | POA: Diagnosis not present

## 2021-11-11 DIAGNOSIS — Z6829 Body mass index (BMI) 29.0-29.9, adult: Secondary | ICD-10-CM | POA: Diagnosis not present

## 2021-11-11 DIAGNOSIS — E663 Overweight: Secondary | ICD-10-CM | POA: Diagnosis not present

## 2021-11-11 DIAGNOSIS — M25471 Effusion, right ankle: Secondary | ICD-10-CM | POA: Diagnosis not present

## 2021-12-16 ENCOUNTER — Other Ambulatory Visit: Payer: Self-pay

## 2021-12-16 MED ORDER — ATORVASTATIN CALCIUM 20 MG PO TABS: 20.0000 mg | ORAL_TABLET | Freq: Every day | ORAL | 3 refills | Status: DC

## 2022-01-15 ENCOUNTER — Inpatient Hospital Stay: Payer: Medicare HMO | Admitting: Internal Medicine

## 2022-01-15 ENCOUNTER — Other Ambulatory Visit: Payer: Self-pay

## 2022-01-15 ENCOUNTER — Inpatient Hospital Stay: Payer: Medicare HMO | Attending: Internal Medicine

## 2022-01-15 VITALS — BP 118/46 | HR 62 | Temp 98.4°F | Resp 19 | Wt 179.1 lb

## 2022-01-15 DIAGNOSIS — D473 Essential (hemorrhagic) thrombocythemia: Secondary | ICD-10-CM | POA: Diagnosis not present

## 2022-01-15 DIAGNOSIS — Z79899 Other long term (current) drug therapy: Secondary | ICD-10-CM | POA: Diagnosis not present

## 2022-01-15 DIAGNOSIS — D509 Iron deficiency anemia, unspecified: Secondary | ICD-10-CM | POA: Diagnosis not present

## 2022-01-15 LAB — CBC WITH DIFFERENTIAL (CANCER CENTER ONLY)
Abs Immature Granulocytes: 0.01 10*3/uL (ref 0.00–0.07)
Basophils Absolute: 0.1 10*3/uL (ref 0.0–0.1)
Basophils Relative: 1 %
Eosinophils Absolute: 0.1 10*3/uL (ref 0.0–0.5)
Eosinophils Relative: 2 %
HCT: 42.6 % (ref 36.0–46.0)
Hemoglobin: 13.7 g/dL (ref 12.0–15.0)
Immature Granulocytes: 0 %
Lymphocytes Relative: 25 %
Lymphs Abs: 1 10*3/uL (ref 0.7–4.0)
MCH: 29.7 pg (ref 26.0–34.0)
MCHC: 32.2 g/dL (ref 30.0–36.0)
MCV: 92.2 fL (ref 80.0–100.0)
Monocytes Absolute: 0.4 10*3/uL (ref 0.1–1.0)
Monocytes Relative: 9 %
Neutro Abs: 2.6 10*3/uL (ref 1.7–7.7)
Neutrophils Relative %: 63 %
Platelet Count: 370 10*3/uL (ref 150–400)
RBC: 4.62 MIL/uL (ref 3.87–5.11)
RDW: 13.5 % (ref 11.5–15.5)
WBC Count: 4.1 10*3/uL (ref 4.0–10.5)
nRBC: 0 % (ref 0.0–0.2)

## 2022-01-15 LAB — CMP (CANCER CENTER ONLY)
ALT: 20 U/L (ref 0–44)
AST: 26 U/L (ref 15–41)
Albumin: 4.3 g/dL (ref 3.5–5.0)
Alkaline Phosphatase: 57 U/L (ref 38–126)
Anion gap: 4 — ABNORMAL LOW (ref 5–15)
BUN: 15 mg/dL (ref 8–23)
CO2: 28 mmol/L (ref 22–32)
Calcium: 9.2 mg/dL (ref 8.9–10.3)
Chloride: 110 mmol/L (ref 98–111)
Creatinine: 0.94 mg/dL (ref 0.44–1.00)
GFR, Estimated: >60 mL/min (ref >60)
Glucose, Bld: 91 mg/dL (ref 70–99)
Potassium: 4.4 mmol/L (ref 3.5–5.1)
Sodium: 142 mmol/L (ref 135–145)
Total Bilirubin: 0.9 mg/dL (ref 0.3–1.2)
Total Protein: 6.9 g/dL (ref 6.5–8.1)

## 2022-01-15 LAB — LACTATE DEHYDROGENASE: LDH: 233 U/L — ABNORMAL HIGH (ref 98–192)

## 2022-01-15 NOTE — Progress Notes (Signed)
?    [facility] ?[phone]   [phone] ? ?OFFICE PROGRESS NOTE ? ?Ruth Lass, MD ?[place] ?[place] [address] ? ?DIAGNOSIS: Essential Thrombocythemia. [name] 2 mutation positive.  ?  ?PRIOR THERAPY: None ?  ?CURRENT THERAPY: ?1) Hydroxyurea 1000 mg alternating with 500 mg every other day . First dose on 07/03/20 started as 500 mg p.o. daily. ?2) Integra plus 1 tablet p.o. daily ?  ? ?INTERVAL HISTORY: ?Ruth Sharp 71 y.o. female returns to the clinic today for follow-up visit.  The patient is feeling fine today with no concerning complaints.  She denied having any chest pain, shortness of breath, cough or hemoptysis.  She denied having any fever or chills.  She has no nausea, vomiting, diarrhea or constipation.  She has no headache or visual changes.  She has no weight loss or night sweats.  She continues to tolerate her treatment with hydroxyurea fairly well.  The patient is here today for evaluation and repeat blood work. ? ?MEDICAL HISTORY: ?Past Medical History:  ?Diagnosis Date  ? Arthritis   ? Tachycardia   ? ? ?ALLERGIES:  is allergic to keflex [cephalexin] and tape. ? ?MEDICATIONS:  ?Current Outpatient Medications  ?Medication Sig Dispense Refill  ? atorvastatin (LIPITOR) 20 MG tablet Take 1 tablet (20 mg total) by mouth daily. 100 tablet 3  ? Bacillus Coagulans-Inulin (PROBIOTIC) 1-250 BILLION-MG CAPS Take 1 capsule by mouth daily. Pt states uses Kristopher Oppenheim brand    ? buPROPion (WELLBUTRIN) 75 MG tablet Take 75 mg by mouth 2 (two) times daily.    ? Calcium Carbonate-Vitamin D (CALTRATE 600+D PO) Take 1 tablet by mouth daily.    ? FeFum-FePoly-FA-B Cmp-C-Biot (FOLIVANE-PLUS) CAPS TAKE ONE CAPSULE BY MOUTH ONCE DAILY 90 capsule 1  ? hydroxychloroquine (PLAQUENIL) 200 MG tablet Take 2 tablets (400 mg total) by mouth daily. 90 tablet 3  ? hydroxyurea (HYDREA) 500 MG capsule TAKE ONE CAPSULE BY MOUTH EVERY OTHER DAY alternating with TWO capsules as  directed. MAY take with food TO minimize GI side effects. 180 capsule 2  ? ibuprofen (ADVIL) 200 MG tablet Take 1 tablet by mouth daily as needed.    ? metoprolol tartrate (LOPRESSOR) 50 MG tablet Take 1.5 tablets (75 mg total) by mouth 2 (two) times daily. 270 tablet 4  ? Multiple Vitamins-Minerals (CENTRUM SILVER) CHEW Chew 1 each by mouth daily.    ? prochlorperazine (COMPAZINE) 10 MG tablet Take 1 tablet (10 mg total) by mouth every 6 (six) hours as needed for nausea or vomiting. 30 tablet 0  ? RESTASIS 0.05 % ophthalmic emulsion Place 1 drop into both eyes 2 (two) times daily.     ? TURMERIC PO Take by mouth.    ? ?No current facility-administered medications for this visit.  ? ? ?SURGICAL HISTORY:  ?Past Surgical History:  ?Procedure Laterality Date  ? ABDOMINAL HYSTERECTOMY    ? BREAST EXCISIONAL BIOPSY Right   ? benign  ? knee replacements    ? ? ?REVIEW OF SYSTEMS:  A comprehensive review of systems was negative.  ? ?PHYSICAL EXAMINATION: General appearance: alert, cooperative, and no distress ?Head: Normocephalic, without obvious abnormality, atraumatic ?Neck: no adenopathy, no JVD, supple, symmetrical, trachea midline, and thyroid not enlarged, symmetric, no tenderness/mass/nodules ?Lymph nodes: Cervical, supraclavicular, and axillary nodes normal. ?Resp: clear to auscultation bilaterally ?Back: symmetric, no curvature. ROM normal. No CVA tenderness. ?Cardio: regular rate and rhythm, S1, S2 normal, no murmur, click, rub or gallop ?GI:  soft, non-tender; bowel sounds normal; no masses,  no organomegaly ?Extremities: extremities normal, atraumatic, no cyanosis or edema ? ?ECOG PERFORMANCE STATUS: 0 - Asymptomatic ? ?Blood pressure (!) 118/46, pulse 62, temperature 98.4 ?F (36.9 ?C), temperature source Oral, resp. rate 19, weight 179 lb 1 oz (81.2 kg), SpO2 97 %. ? ?LABORATORY DATA: ?Lab Results  ?Component Value Date  ? WBC 4.1 01/15/2022  ? HGB 13.7 01/15/2022  ? HCT 42.6 01/15/2022  ? MCV 92.2 01/15/2022   ? PLT 370 01/15/2022  ? ? ?  Chemistry   ?   ?Component Value Date/Time  ? NA 142 10/20/2021 1456  ? NA 142 04/20/2017 1033  ? K 4.1 10/20/2021 1456  ? CL 106 10/20/2021 1456  ? CO2 29 10/20/2021 1456  ? BUN 19 10/20/2021 1456  ? BUN 16 04/20/2017 1033  ? CREATININE 1.20 (H) 10/20/2021 1456  ?    ?Component Value Date/Time  ? CALCIUM 9.2 10/20/2021 1456  ? ALKPHOS 59 10/20/2021 1456  ? AST 26 10/20/2021 1456  ? ALT 20 10/20/2021 1456  ? BILITOT 1.0 10/20/2021 1456  ?  ? ? ? ?RADIOGRAPHIC STUDIES: ?No results found. ? ?ASSESSMENT AND PLAN: This is a very pleasant 71 years old white female recently diagnosed with essential thrombocythemia as well as iron deficiency anemia.  The patient is currently on treatment with hydroxyurea 1000 mg p.o. alternating with 500 mg every other day in addition to Integra +1 capsule p.o. daily. ?The patient has been tolerating her treatment with hydroxyurea and Integra +1 capsule p.o. daily fairly well. ?CBC today is completely normal ?I recommended for her to continue on the same dose of hydroxyurea 1000 mg alternating with 500 mg every other day. ?I will see her back for follow-up visit in 3 months for evaluation with repeat blood work. ?She was advised to call immediately if she has any other concerning symptoms in the interval. ? ?The patient voices understanding of current disease status and treatment options and is in agreement with the current care plan. ? ?All questions were answered. The patient knows to call the clinic with any problems, questions or concerns. We can certainly see the patient much sooner if necessary. ? ?Disclaimer: This note was dictated with voice recognition software. Similar sounding words can inadvertently be transcribed and may not be corrected upon review. ? ? ?  ?  ?

## 2022-03-04 ENCOUNTER — Other Ambulatory Visit: Payer: Self-pay | Admitting: Physician Assistant

## 2022-03-04 DIAGNOSIS — D75839 Thrombocytosis, unspecified: Secondary | ICD-10-CM

## 2022-03-10 ENCOUNTER — Telehealth: Payer: Self-pay | Admitting: Internal Medicine

## 2022-04-20 ENCOUNTER — Inpatient Hospital Stay: Payer: Medicare HMO | Attending: Internal Medicine

## 2022-04-20 ENCOUNTER — Inpatient Hospital Stay: Payer: Medicare HMO | Admitting: Internal Medicine

## 2022-04-20 ENCOUNTER — Other Ambulatory Visit: Payer: Self-pay

## 2022-04-20 VITALS — BP 106/67 | HR 65 | Temp 98.3°F | Resp 18 | Wt 177.1 lb

## 2022-04-20 DIAGNOSIS — D509 Iron deficiency anemia, unspecified: Secondary | ICD-10-CM | POA: Diagnosis not present

## 2022-04-20 DIAGNOSIS — D473 Essential (hemorrhagic) thrombocythemia: Secondary | ICD-10-CM

## 2022-04-20 DIAGNOSIS — Z79899 Other long term (current) drug therapy: Secondary | ICD-10-CM | POA: Insufficient documentation

## 2022-04-20 LAB — CBC WITH DIFFERENTIAL (CANCER CENTER ONLY)
Abs Immature Granulocytes: 0.02 10*3/uL (ref 0.00–0.07)
Basophils Absolute: 0.1 10*3/uL (ref 0.0–0.1)
Basophils Relative: 1 %
Eosinophils Absolute: 0.1 10*3/uL (ref 0.0–0.5)
Eosinophils Relative: 3 %
HCT: 36.8 % (ref 36.0–46.0)
Hemoglobin: 12.1 g/dL (ref 12.0–15.0)
Immature Granulocytes: 0 %
Lymphocytes Relative: 21 %
Lymphs Abs: 1 10*3/uL (ref 0.7–4.0)
MCH: 30 pg (ref 26.0–34.0)
MCHC: 32.9 g/dL (ref 30.0–36.0)
MCV: 91.3 fL (ref 80.0–100.0)
Monocytes Absolute: 0.4 10*3/uL (ref 0.1–1.0)
Monocytes Relative: 8 %
Neutro Abs: 3.1 10*3/uL (ref 1.7–7.7)
Neutrophils Relative %: 67 %
Platelet Count: 389 10*3/uL (ref 150–400)
RBC: 4.03 MIL/uL (ref 3.87–5.11)
RDW: 15.4 % (ref 11.5–15.5)
WBC Count: 4.7 10*3/uL (ref 4.0–10.5)
nRBC: 0 % (ref 0.0–0.2)

## 2022-04-20 LAB — CMP (CANCER CENTER ONLY)
ALT: 23 U/L (ref 0–44)
AST: 28 U/L (ref 15–41)
Albumin: 4.5 g/dL (ref 3.5–5.0)
Alkaline Phosphatase: 58 U/L (ref 38–126)
Anion gap: 6 (ref 5–15)
BUN: 17 mg/dL (ref 8–23)
CO2: 28 mmol/L (ref 22–32)
Calcium: 9.4 mg/dL (ref 8.9–10.3)
Chloride: 107 mmol/L (ref 98–111)
Creatinine: 1.06 mg/dL — ABNORMAL HIGH (ref 0.44–1.00)
GFR, Estimated: 56 mL/min — ABNORMAL LOW (ref >60)
Glucose, Bld: 108 mg/dL — ABNORMAL HIGH (ref 70–99)
Potassium: 4.3 mmol/L (ref 3.5–5.1)
Sodium: 141 mmol/L (ref 135–145)
Total Bilirubin: 0.9 mg/dL (ref 0.3–1.2)
Total Protein: 7 g/dL (ref 6.5–8.1)

## 2022-04-20 LAB — LACTATE DEHYDROGENASE: LDH: 216 U/L — ABNORMAL HIGH (ref 98–192)

## 2022-04-20 NOTE — Progress Notes (Signed)
[facility] [phone]   [phone]  OFFICE PROGRESS NOTE  [name], MD [address] [place] [address]  DIAGNOSIS: Essential Thrombocythemia. [name] 2 mutation positive.    PRIOR THERAPY: None   CURRENT THERAPY: 1) Hydroxyurea 1000 mg alternating with 500 mg every other day . First dose on 07/03/20 started as 500 mg p.o. daily. 2) Integra plus 1 tablet p.o. daily    INTERVAL HISTORY: Ruth Sharp 71 y.o. female returns to the clinic today for 64-monthfollow-up visit.  The patient is feeling fine today with no concerning complaints.  She denied having any fatigue or weakness.  She has no nausea, vomiting, diarrhea or constipation.  She has no chest pain, shortness of breath, cough or hemoptysis.  She has no recent weight loss or night sweats.  She has no bleeding, bruises or ecchymosis.  She continues to tolerate her treatment with hydroxyurea fairly well.  She is here today for evaluation and repeat blood work.  MEDICAL HISTORY: Past Medical History:  Diagnosis Date   Arthritis    Tachycardia     ALLERGIES:  is allergic to keflex [cephalexin] and tape.  MEDICATIONS:  Current Outpatient Medications  Medication Sig Dispense Refill   atorvastatin (LIPITOR) 20 MG tablet Take 1 tablet (20 mg total) by mouth daily. 100 tablet 3   Bacillus Coagulans-Inulin (PROBIOTIC) 1-250 BILLION-MG CAPS Take 1 capsule by mouth daily. Pt states uses HKristopher Oppenheimbrand     buPROPion (WELLBUTRIN) 75 MG tablet Take 75 mg by mouth 2 (two) times daily.     Calcium Carbonate-Vitamin D (CALTRATE 600+D PO) Take 1 tablet by mouth daily.     FeFum-FePoly-FA-B Cmp-C-Biot (FOLIVANE-PLUS) CAPS TAKE ONE CAPSULE BY MOUTH ONCE DAILY 90 capsule 1   hydroxychloroquine (PLAQUENIL) 200 MG tablet Take 2 tablets (400 mg total) by mouth daily. 90 tablet 3   hydroxyurea (HYDREA) 500 MG capsule TAKE ONE CAPSULE BY MOUTH EVERY OTHER DAY alternating with TWO capsules as  directed. MAY take with food TO minimize GI side effects. 180 capsule 2   ibuprofen (ADVIL) 200 MG tablet Take 1 tablet by mouth daily as needed.     metoprolol tartrate (LOPRESSOR) 50 MG tablet Take 1.5 tablets (75 mg total) by mouth 2 (two) times daily. 270 tablet 4   Multiple Vitamins-Minerals (CENTRUM SILVER) CHEW Chew 1 each by mouth daily.     prochlorperazine (COMPAZINE) 10 MG tablet Take 1 tablet (10 mg total) by mouth every 6 (six) hours as needed for nausea or vomiting. 30 tablet 0   RESTASIS 0.05 % ophthalmic emulsion Place 1 drop into both eyes 2 (two) times daily.      TURMERIC PO Take by mouth.     No current facility-administered medications for this visit.    SURGICAL HISTORY:  Past Surgical History:  Procedure Laterality Date   ABDOMINAL HYSTERECTOMY     BREAST EXCISIONAL BIOPSY Right    benign   knee replacements      REVIEW OF SYSTEMS:  A comprehensive review of systems was negative.   PHYSICAL EXAMINATION: General appearance: alert, cooperative, and no distress Head: Normocephalic, without obvious abnormality, atraumatic Neck: no adenopathy, no JVD, supple, symmetrical, trachea midline, and thyroid not enlarged, symmetric, no tenderness/mass/nodules Lymph nodes: Cervical, supraclavicular, and axillary nodes normal. Resp: clear to auscultation bilaterally Back: symmetric, no curvature. ROM normal. No CVA tenderness. Cardio: regular rate and rhythm, S1, S2 normal, no murmur, click, rub or gallop GI:  soft, non-tender; bowel sounds normal; no masses,  no organomegaly Extremities: extremities normal, atraumatic, no cyanosis or edema  ECOG PERFORMANCE STATUS: 0 - Asymptomatic  Blood pressure 106/67, pulse 65, temperature 98.3 F (36.8 C), temperature source Oral, resp. rate 18, weight 177 lb 2 oz (80.3 kg), SpO2 95 %.  LABORATORY DATA: Lab Results  Component Value Date   WBC 4.7 04/20/2022   HGB 12.1 04/20/2022   HCT 36.8 04/20/2022   MCV 91.3 04/20/2022    PLT 389 04/20/2022      Chemistry      Component Value Date/Time   NA 141 04/20/2022 0904   NA 142 04/20/2017 1033   K 4.3 04/20/2022 0904   CL 107 04/20/2022 0904   CO2 28 04/20/2022 0904   BUN 17 04/20/2022 0904   BUN 16 04/20/2017 1033   CREATININE 1.06 (H) 04/20/2022 0904      Component Value Date/Time   CALCIUM 9.4 04/20/2022 0904   ALKPHOS 58 04/20/2022 0904   AST 28 04/20/2022 0904   ALT 23 04/20/2022 0904   BILITOT 0.9 04/20/2022 0904       RADIOGRAPHIC STUDIES: No results found.  ASSESSMENT AND PLAN: This is a very pleasant 71 years old white female recently diagnosed with essential thrombocythemia as well as iron deficiency anemia.  The patient is currently on treatment with hydroxyurea 1000 mg p.o. alternating with 500 mg every other day in addition to Integra +1 capsule p.o. daily. The patient has been tolerating her treatment with hydroxyurea and Integra +1 capsule p.o. daily fairly well. The patient has been tolerating this treatment with hydroxyurea and Integra plus fairly well. Repeat CBC and comprehensive metabolic panel today are unremarkable except for slight elevation of her serum creatinine to 1.06. I recommended for the patient to continue her current treatment with hydroxyurea with the same dose. I will see her back for follow-up visit in 3 months for evaluation and repeat blood work. She was advised to call immediately if she has any concerning symptoms in the interval.  The patient voices understanding of current disease status and treatment options and is in agreement with the current care plan.  All questions were answered. The patient knows to call the clinic with any problems, questions or concerns. We can certainly see the patient much sooner if necessary.  Disclaimer: This note was dictated with voice recognition software. Similar sounding words can inadvertently be transcribed and may not be corrected upon review.

## 2022-05-12 DIAGNOSIS — M8589 Other specified disorders of bone density and structure, multiple sites: Secondary | ICD-10-CM | POA: Diagnosis not present

## 2022-05-12 DIAGNOSIS — Z6829 Body mass index (BMI) 29.0-29.9, adult: Secondary | ICD-10-CM | POA: Diagnosis not present

## 2022-05-12 DIAGNOSIS — E663 Overweight: Secondary | ICD-10-CM | POA: Diagnosis not present

## 2022-05-12 DIAGNOSIS — R5383 Other fatigue: Secondary | ICD-10-CM | POA: Diagnosis not present

## 2022-05-12 DIAGNOSIS — M25471 Effusion, right ankle: Secondary | ICD-10-CM | POA: Diagnosis not present

## 2022-05-12 DIAGNOSIS — M2550 Pain in unspecified joint: Secondary | ICD-10-CM | POA: Diagnosis not present

## 2022-05-12 DIAGNOSIS — M150 Primary generalized (osteo)arthritis: Secondary | ICD-10-CM | POA: Diagnosis not present

## 2022-05-12 DIAGNOSIS — D473 Essential (hemorrhagic) thrombocythemia: Secondary | ICD-10-CM | POA: Diagnosis not present

## 2022-05-12 DIAGNOSIS — M0579 Rheumatoid arthritis with rheumatoid factor of multiple sites without organ or systems involvement: Secondary | ICD-10-CM | POA: Diagnosis not present

## 2022-05-13 DIAGNOSIS — Z79899 Other long term (current) drug therapy: Secondary | ICD-10-CM | POA: Diagnosis not present

## 2022-05-13 DIAGNOSIS — Z961 Presence of intraocular lens: Secondary | ICD-10-CM | POA: Diagnosis not present

## 2022-05-13 DIAGNOSIS — H0102B Squamous blepharitis left eye, upper and lower eyelids: Secondary | ICD-10-CM | POA: Diagnosis not present

## 2022-05-13 DIAGNOSIS — H16223 Keratoconjunctivitis sicca, not specified as Sjogren's, bilateral: Secondary | ICD-10-CM | POA: Diagnosis not present

## 2022-05-27 ENCOUNTER — Other Ambulatory Visit: Payer: Self-pay | Admitting: Internal Medicine

## 2022-05-27 DIAGNOSIS — D473 Essential (hemorrhagic) thrombocythemia: Secondary | ICD-10-CM

## 2022-05-29 DIAGNOSIS — R059 Cough, unspecified: Secondary | ICD-10-CM | POA: Diagnosis not present

## 2022-05-29 DIAGNOSIS — U071 COVID-19: Secondary | ICD-10-CM | POA: Diagnosis not present

## 2022-06-18 DIAGNOSIS — F411 Generalized anxiety disorder: Secondary | ICD-10-CM | POA: Diagnosis not present

## 2022-06-18 DIAGNOSIS — F324 Major depressive disorder, single episode, in partial remission: Secondary | ICD-10-CM | POA: Diagnosis not present

## 2022-06-18 DIAGNOSIS — D473 Essential (hemorrhagic) thrombocythemia: Secondary | ICD-10-CM | POA: Diagnosis not present

## 2022-06-18 DIAGNOSIS — E785 Hyperlipidemia, unspecified: Secondary | ICD-10-CM | POA: Diagnosis not present

## 2022-06-18 DIAGNOSIS — K219 Gastro-esophageal reflux disease without esophagitis: Secondary | ICD-10-CM | POA: Diagnosis not present

## 2022-06-18 DIAGNOSIS — Z7969 Long term (current) use of other immunomodulators and immunosuppressants: Secondary | ICD-10-CM | POA: Diagnosis not present

## 2022-06-18 DIAGNOSIS — Z7964 Long term (current) use of myelosuppressive agent: Secondary | ICD-10-CM | POA: Diagnosis not present

## 2022-06-18 DIAGNOSIS — I10 Essential (primary) hypertension: Secondary | ICD-10-CM | POA: Diagnosis not present

## 2022-06-18 DIAGNOSIS — Z6828 Body mass index (BMI) 28.0-28.9, adult: Secondary | ICD-10-CM | POA: Diagnosis not present

## 2022-06-18 DIAGNOSIS — E669 Obesity, unspecified: Secondary | ICD-10-CM | POA: Diagnosis not present

## 2022-06-18 DIAGNOSIS — R69 Illness, unspecified: Secondary | ICD-10-CM | POA: Diagnosis not present

## 2022-06-18 DIAGNOSIS — Z008 Encounter for other general examination: Secondary | ICD-10-CM | POA: Diagnosis not present

## 2022-06-18 DIAGNOSIS — Z791 Long term (current) use of non-steroidal anti-inflammatories (NSAID): Secondary | ICD-10-CM | POA: Diagnosis not present

## 2022-06-18 DIAGNOSIS — M069 Rheumatoid arthritis, unspecified: Secondary | ICD-10-CM | POA: Diagnosis not present

## 2022-06-29 DIAGNOSIS — H903 Sensorineural hearing loss, bilateral: Secondary | ICD-10-CM | POA: Diagnosis not present

## 2022-07-20 ENCOUNTER — Inpatient Hospital Stay: Payer: Medicare HMO | Admitting: Internal Medicine

## 2022-07-20 ENCOUNTER — Other Ambulatory Visit: Payer: Self-pay

## 2022-07-20 ENCOUNTER — Inpatient Hospital Stay: Payer: Medicare HMO | Attending: Internal Medicine

## 2022-07-20 VITALS — BP 111/64 | HR 65 | Temp 97.9°F | Wt 172.2 lb

## 2022-07-20 DIAGNOSIS — D473 Essential (hemorrhagic) thrombocythemia: Secondary | ICD-10-CM | POA: Insufficient documentation

## 2022-07-20 DIAGNOSIS — Z79899 Other long term (current) drug therapy: Secondary | ICD-10-CM | POA: Insufficient documentation

## 2022-07-20 DIAGNOSIS — D509 Iron deficiency anemia, unspecified: Secondary | ICD-10-CM | POA: Diagnosis not present

## 2022-07-20 LAB — CBC WITH DIFFERENTIAL (CANCER CENTER ONLY)
Abs Immature Granulocytes: 0.02 10*3/uL (ref 0.00–0.07)
Basophils Absolute: 0.1 10*3/uL (ref 0.0–0.1)
Basophils Relative: 1 %
Eosinophils Absolute: 0.1 10*3/uL (ref 0.0–0.5)
Eosinophils Relative: 3 %
HCT: 41.2 % (ref 36.0–46.0)
Hemoglobin: 13.7 g/dL (ref 12.0–15.0)
Immature Granulocytes: 0 %
Lymphocytes Relative: 21 %
Lymphs Abs: 1 10*3/uL (ref 0.7–4.0)
MCH: 31 pg (ref 26.0–34.0)
MCHC: 33.3 g/dL (ref 30.0–36.0)
MCV: 93.2 fL (ref 80.0–100.0)
Monocytes Absolute: 0.4 10*3/uL (ref 0.1–1.0)
Monocytes Relative: 8 %
Neutro Abs: 3.3 10*3/uL (ref 1.7–7.7)
Neutrophils Relative %: 67 %
Platelet Count: 376 10*3/uL (ref 150–400)
RBC: 4.42 MIL/uL (ref 3.87–5.11)
RDW: 14 % (ref 11.5–15.5)
WBC Count: 5 10*3/uL (ref 4.0–10.5)
nRBC: 0 % (ref 0.0–0.2)

## 2022-07-20 LAB — CMP (CANCER CENTER ONLY)
ALT: 19 U/L (ref 0–44)
AST: 25 U/L (ref 15–41)
Albumin: 4.3 g/dL (ref 3.5–5.0)
Alkaline Phosphatase: 61 U/L (ref 38–126)
Anion gap: 4 — ABNORMAL LOW (ref 5–15)
BUN: 16 mg/dL (ref 8–23)
CO2: 29 mmol/L (ref 22–32)
Calcium: 9.3 mg/dL (ref 8.9–10.3)
Chloride: 109 mmol/L (ref 98–111)
Creatinine: 0.98 mg/dL (ref 0.44–1.00)
GFR, Estimated: >60 mL/min (ref >60)
Glucose, Bld: 93 mg/dL (ref 70–99)
Potassium: 4.6 mmol/L (ref 3.5–5.1)
Sodium: 142 mmol/L (ref 135–145)
Total Bilirubin: 0.9 mg/dL (ref 0.3–1.2)
Total Protein: 6.8 g/dL (ref 6.5–8.1)

## 2022-07-20 LAB — LACTATE DEHYDROGENASE: LDH: 223 U/L — ABNORMAL HIGH (ref 98–192)

## 2022-07-20 NOTE — Progress Notes (Signed)
[facility] [phone]   [phone]  OFFICE PROGRESS NOTE  [name], MD [address] [place] [address]  DIAGNOSIS: Essential Thrombocythemia. Ruth Sharp 2 mutation positive.    PRIOR THERAPY: None   CURRENT THERAPY: 1) Hydroxyurea 1000 mg alternating with 500 mg every other day . First dose on 07/03/20 started as 500 mg p.o. daily. 2) Integra plus 1 tablet p.o. daily    INTERVAL HISTORY: Ruth Sharp 71 y.o. female returns to the clinic today for follow-up visit.  The patient is feeling fine today with no concerning complaints.  She denied having any current chest pain, shortness of breath, cough or hemoptysis.  She has no nausea, vomiting, diarrhea or constipation.  She has no headache or visual changes.  She denied having any recent weight loss or night sweats.  She has been tolerating her treatment with hydroxyurea and Integra plus fairly well.  She is here today for evaluation and repeat blood work.   MEDICAL HISTORY: Past Medical History:  Diagnosis Date   Arthritis    Tachycardia     ALLERGIES:  is allergic to keflex [cephalexin] and tape.  MEDICATIONS:  Current Outpatient Medications  Medication Sig Dispense Refill   atorvastatin (LIPITOR) 20 MG tablet Take 1 tablet (20 mg total) by mouth daily. 100 tablet 3   Bacillus Coagulans-Inulin (PROBIOTIC) 1-250 BILLION-MG CAPS Take 1 capsule by mouth daily. Pt states uses Kristopher Oppenheim brand     buPROPion (WELLBUTRIN) 75 MG tablet Take 75 mg by mouth 2 (two) times daily.     Calcium Carbonate-Vitamin D (CALTRATE 600+D PO) Take 1 tablet by mouth daily.     FeFum-FePoly-FA-B Cmp-C-Biot (FOLIVANE-PLUS) CAPS TAKE ONE CAPSULE BY MOUTH ONCE DAILY 90 capsule 1   hydroxychloroquine (PLAQUENIL) 200 MG tablet Take 2 tablets (400 mg total) by mouth daily. 90 tablet 3   hydroxyurea (HYDREA) 500 MG capsule TAKE ONE CAPSULE BY MOUTH EVERY OTHER DAY alternating with TWO CAPSULES AS DIRECTED.  180 capsule 2   ibuprofen (ADVIL) 200 MG tablet Take 1 tablet by mouth daily as needed.     metoprolol tartrate (LOPRESSOR) 50 MG tablet Take 1.5 tablets (75 mg total) by mouth 2 (two) times daily. 270 tablet 4   Multiple Vitamins-Minerals (CENTRUM SILVER) CHEW Chew 1 each by mouth daily.     prochlorperazine (COMPAZINE) 10 MG tablet Take 1 tablet (10 mg total) by mouth every 6 (six) hours as needed for nausea or vomiting. 30 tablet 0   RESTASIS 0.05 % ophthalmic emulsion Place 1 drop into both eyes 2 (two) times daily.      TURMERIC PO Take by mouth.     No current facility-administered medications for this visit.    SURGICAL HISTORY:  Past Surgical History:  Procedure Laterality Date   ABDOMINAL HYSTERECTOMY     BREAST EXCISIONAL BIOPSY Right    benign   knee replacements      REVIEW OF SYSTEMS:  A comprehensive review of systems was negative.   PHYSICAL EXAMINATION: General appearance: alert, cooperative, and no distress Head: Normocephalic, without obvious abnormality, atraumatic Neck: no adenopathy, no JVD, supple, symmetrical, trachea midline, and thyroid not enlarged, symmetric, no tenderness/mass/nodules Lymph nodes: Cervical, supraclavicular, and axillary nodes normal. Resp: clear to auscultation bilaterally Back: symmetric, no curvature. ROM normal. No CVA tenderness. Cardio: regular rate and rhythm, S1, S2 normal, no murmur, click, rub or gallop GI: soft, non-tender; bowel sounds normal; no masses,  no organomegaly Extremities:  extremities normal, atraumatic, no cyanosis or edema  ECOG PERFORMANCE STATUS: 0 - Asymptomatic  Blood pressure 111/64, pulse 65, temperature 97.9 F (36.6 C), temperature source Tympanic, weight 172 lb 3.2 oz (78.1 kg), SpO2 98 %.  LABORATORY DATA: Lab Results  Component Value Date   WBC 4.7 04/20/2022   HGB 12.1 04/20/2022   HCT 36.8 04/20/2022   MCV 91.3 04/20/2022   PLT 389 04/20/2022      Chemistry      Component Value Date/Time    NA 141 04/20/2022 0904   NA 142 04/20/2017 1033   K 4.3 04/20/2022 0904   CL 107 04/20/2022 0904   CO2 28 04/20/2022 0904   BUN 17 04/20/2022 0904   BUN 16 04/20/2017 1033   CREATININE 1.06 (H) 04/20/2022 0904      Component Value Date/Time   CALCIUM 9.4 04/20/2022 0904   ALKPHOS 58 04/20/2022 0904   AST 28 04/20/2022 0904   ALT 23 04/20/2022 0904   BILITOT 0.9 04/20/2022 0904       RADIOGRAPHIC STUDIES: No results found.  ASSESSMENT AND PLAN: This is a very pleasant 71 years old white female recently diagnosed with essential thrombocythemia as well as iron deficiency anemia.  The patient is currently on treatment with hydroxyurea 1000 mg p.o. alternating with 500 mg every other day in addition to Integra +1 capsule p.o. daily. The patient has been tolerating her treatment with hydroxyurea and Integra +1 capsule p.o. daily fairly well. The patient has been tolerating this treatment well with no concerning adverse effects. Repeat CBC today is unremarkable for any anemia or thrombocytosis. I recommended for her to continue her current treatment with hydroxyurea and Integra plus with the same dose for now. I will see her back for follow-up visit in 3 months for evaluation and repeat blood work. She was advised to call immediately if she has any other concerning symptoms in the interval. The patient voices understanding of current disease status and treatment options and is in agreement with the current care plan.  All questions were answered. The patient knows to call the clinic with any problems, questions or concerns. We can certainly see the patient much sooner if necessary.  Disclaimer: This note was dictated with voice recognition software. Similar sounding words can inadvertently be transcribed and may not be corrected upon review.

## 2022-07-30 ENCOUNTER — Telehealth: Payer: Self-pay | Admitting: Internal Medicine

## 2022-08-04 DIAGNOSIS — R251 Tremor, unspecified: Secondary | ICD-10-CM | POA: Diagnosis not present

## 2022-09-17 ENCOUNTER — Telehealth: Payer: Self-pay | Admitting: *Deleted

## 2022-09-17 ENCOUNTER — Ambulatory Visit: Payer: Medicare HMO | Admitting: Neurology

## 2022-09-17 ENCOUNTER — Encounter: Payer: Self-pay | Admitting: Neurology

## 2022-09-17 VITALS — BP 113/78 | HR 65 | Ht 67.0 in | Wt 174.8 lb

## 2022-09-17 DIAGNOSIS — G479 Sleep disorder, unspecified: Secondary | ICD-10-CM

## 2022-09-17 DIAGNOSIS — R6889 Other general symptoms and signs: Secondary | ICD-10-CM

## 2022-09-17 DIAGNOSIS — R351 Nocturia: Secondary | ICD-10-CM

## 2022-09-17 DIAGNOSIS — R251 Tremor, unspecified: Secondary | ICD-10-CM | POA: Diagnosis not present

## 2022-09-17 DIAGNOSIS — G4733 Obstructive sleep apnea (adult) (pediatric): Secondary | ICD-10-CM | POA: Diagnosis not present

## 2022-09-17 NOTE — Progress Notes (Signed)
Subjective:    Patient ID: Ruth Sharp is a 72 y.o. female.  HPI    Star Age, MD, PhD [facility] [address].O. [place], [place] [address]  Dear Dr. Vanessa [place],  I saw your patient, Ruth Sharp, upon your kind request in my neurologic clinic today for initial consultation of her tremors.  The patient is unaccompanied by today.  As you know, Ruth Sharp is a 72 year old LH female with an underlying medical history of arthritis, hyperlipidemia, PACs, tachycardia, thrombocythemia, history of uterine cancer, anxiety, and mildly overweight state, who reports a several week/few month history of primarily left hand tremors noticeable when she is walking.  Symptoms are particularly noticeable when she holds the leash for her dogs with her left hand.  She does not really have a right hand tremor, has not noticed a tremor with holding anything else such as a cup or with handwriting.  1 time she noticed a slight tremor in her left hand when she was holding a measuring cup while baking.  She has not had any major impairment in her day-to-day function but got concerned about the tremor.  She feels that it is more consistent now whenever she holds the leash.  She has not had any pain.  She has not noticed any tremor in her lower extremities or upper body.  She has a family history of tremors affecting her mom, she had a tremor in her hands and her handwriting got worse when she was older.  Mom had very good handwriting before.  Patient reports that her own handwriting has not changed.  She has never been very good at cursive writing.  She is without any major balance issues, has a history of arthritis, she has fallen recently.  She also reports some forgetfulness.  She has not had a physical and recent blood work through your office.  She is agreeable to scheduling a full physical.  As far as triggers, she has not really noticed any obvious triggers.  She drinks  caffeine in limitation in the form of soda at lunch, usually 1 serving.  She may not hydrate all that well every day, depends on how thirsty she feels.  She drinks alcohol occasionally, she is a non-smoker.  She is divorced and lives alone, 2 dogs in the household, she has 1 grown son, age 27 without any obvious history of tremors.  She is retired.  She had both knees replaced, she had a tonsillectomy as a child.  Of note, she does not sleep well, she reports sleep disruption and nocturia about twice per average night, no recurrent morning headaches but has had the occasional pressure sensation.  She had a sleep study some 10 years ago and was diagnosed with sleep apnea and was prescribed a CPAP machine but never got around to getting a machine.  She would be willing to get reevaluated.  I reviewed your office note from 08/04/2022.  She was noted to have a mild postural tremor on exam.  Recent blood work was not included in the referral, she had blood work through oncology on 07/20/2022 which showed a benign CBC with differential, elevated LDH at 223, and normal CMP.  Her Past Medical History Is Significant For: Past Medical History:  Diagnosis Date   Arthritis    Tachycardia     Her Past Surgical History Is Significant For: Past Surgical History:  Procedure Laterality Date   ABDOMINAL HYSTERECTOMY     BREAST  EXCISIONAL BIOPSY Right    benign   knee replacements      Her Family History Is Significant For: Family History  Problem Relation Age of Onset   Heart failure Mother    Tremor Mother    Parkinson's disease Neg Hx     Her Social History Is Significant For: Social History   Socioeconomic History   Marital status: Divorced    Spouse name: Not on file   Number of children: Not on file   Years of education: Not on file   Highest education level: Not on file  Occupational History   Not on file  Tobacco Use   Smoking status: Never   Smokeless tobacco: Never  Vaping Use    Vaping Use: Never used  Substance and Sexual Activity   Alcohol use: Yes    Comment: occasionally   Drug use: Never   Sexual activity: Not on file  Other Topics Concern   Not on file  Social History Narrative   Not on file   Social Determinants of Health   Financial Resource Strain: Not on file  Food Insecurity: Not on file  Transportation Needs: Not on file  Physical Activity: Not on file  Stress: Not on file  Social Connections: Not on file    Her Allergies Are:  Allergies  Allergen Reactions   Keflex [Cephalexin] Rash   Tape Rash  :   Her Current Medications Are:  Outpatient Encounter Medications as of 73/12/2022  Medication Sig   atorvastatin (LIPITOR) 20 MG tablet Take 1 tablet (20 mg total) by mouth daily.   Bacillus Coagulans-Inulin (PROBIOTIC) 1-250 BILLION-MG CAPS Take 1 capsule by mouth daily. Pt states uses Kristopher Oppenheim brand   buPROPion (WELLBUTRIN) 75 MG tablet Take 75 mg by mouth 2 (two) times daily.   Calcium Carbonate-Vitamin D (CALTRATE 600+D PO) Take 1 tablet by mouth daily.   FeFum-FePoly-FA-B Cmp-C-Biot (FOLIVANE-PLUS) CAPS TAKE ONE CAPSULE BY MOUTH ONCE DAILY   hydroxychloroquine (PLAQUENIL) 200 MG tablet Take 2 tablets (400 mg total) by mouth daily.   hydroxyurea (HYDREA) 500 MG capsule TAKE ONE CAPSULE BY MOUTH EVERY OTHER DAY alternating with TWO CAPSULES AS DIRECTED.   ibuprofen (ADVIL) 200 MG tablet Take 1 tablet by mouth daily as needed.   metoprolol tartrate (LOPRESSOR) 50 MG tablet Take 1.5 tablets (75 mg total) by mouth 2 (two) times daily.   Multiple Vitamins-Minerals (CENTRUM SILVER) CHEW Chew 1 each by mouth daily.   prochlorperazine (COMPAZINE) 10 MG tablet Take 1 tablet (10 mg total) by mouth every 6 (six) hours as needed for nausea or vomiting.   RESTASIS 0.05 % ophthalmic emulsion Place 1 drop into both eyes 2 (two) times daily.    TURMERIC PO Take by mouth.   TYRVAYA 0.03 MG/ACT SOLN Place 1 spray into both nostrils 2 (two) times daily.  Per opthamology   No facility-administered encounter medications on file as of 09/17/2022.  :   Review of Systems:  Out of a complete 14 point review of systems, all are reviewed and negative with the exception of these symptoms as listed below:  Review of Systems  Neurological:        Pt here for tremors Pt states tremors in left hand .     Objective:  Neurological Exam  Physical Exam Physical Examination:   Vitals:   09/17/22 0810  BP: 113/78  Pulse: 65    General Examination: The patient is a very pleasant 72 y.o. female in no acute distress.  She appears well-developed and well-nourished and well groomed.   HEENT: Normocephalic, atraumatic, pupils are equal, round and reactive to light, extraocular tracking is good without limitation to gaze excursion or nystagmus noted. Hearing is grossly intact. Face is symmetric with normal facial animation. Speech is clear with no dysarthria noted. There is no hypophonia. There is no lip, neck/head, jaw or voice tremor. Neck is supple with full range of passive and active motion. There are no carotid bruits on auscultation. Oropharynx exam reveals: mild mouth dryness, good dental hygiene and mild airway crowding, due to smaller airway entry, Mallampati class II, tonsils absent.  Tongue protrudes centrally and palate elevates symmetrically.    Chest: Clear to auscultation without wheezing, rhonchi or crackles noted.  Heart: S1+S2+0, regular and normal without murmurs, rubs or gallops noted.   Abdomen: Soft, non-tender and non-distended.  Extremities: There is no pitting edema in the distal lower extremities bilaterally.   Skin: Warm and dry without trophic changes noted.   Musculoskeletal: exam reveals arthritic changes in both hands particularly left hand.  Status post bilateral knee replacements, right knee wider than left.  Slight increase in upper back curvature.   Neurologically:  Mental status: The patient is awake, alert and  oriented in all 4 spheres. Her immediate and remote memory, attention, language skills and fund of knowledge are appropriate. There is no evidence of aphasia, agnosia, apraxia or anomia. Speech is clear with normal prosody and enunciation. Thought process is linear. Mood is normal and affect is normal.  Cranial nerves II - XII are as described above under HEENT exam.  Motor exam: Normal bulk, strength and tone is noted.  No postural tremor, no action tremor, no resting tremor, no lower extremity tremor, no intention tremor. On 09/17/2022: On Archimedes spiral drawing there is very mild tremulousness with the right hand and minimal with the left hand.  Handwriting is legible, not tremulous, not micrographic.    Fine motor skills and coordination: Normal finger taps, hand movements and rapid alternating patting bilaterally in the upper extremities, normal foot taps and foot agility bilaterally in the lower extremities.    Cerebellar testing: No dysmetria or intention tremor. There is no truncal or gait ataxia.  Normal finger-to-nose and normal heel-to-shin bilaterally.  Reflexes are 1+ in the upper extremities, trace in the knees and trace in the ankles bilaterally.  Toes are downgoing bilaterally.  Sensory exam: intact to light touch in the upper and lower extremities.  Gait, station and balance: She stands without difficulty, posture is slightly stooped forward in the upper back with mild increase in curvature noted and slight tilt to the right side.  She walks without a walking aid, no shuffling, normal pace, slight reduction in arm swing bilaterally.  No tremor.  Assessment and Plan:  In summary, Ruth Sharp is a very pleasant 72 y.o.-year old female with an underlying medical history of arthritis, hyperlipidemia, PACs, tachycardia, thrombocythemia, history of uterine cancer, anxiety, and mildly overweight state, who presents for evaluation of her tremor disorder of several weeks' or few months'  duration with intermittent tremor reported in the left hand only with certain activities.  On examination, she has no obvious tremor, no evidence of parkinsonism and overall is largely reassured today.  She has not scheduled a physical with your office, she is encouraged to do so to get blood work done including thyroid function testing.  She also has had some forgetfulness and may benefit from additional blood work such as vitamin B12.  She is advised that I would not recommend any symptomatic treatment for her intermittent tremor, especially since she had a benign exam today.  We can continue to monitor.  Triggers may include suboptimal hydration, caffeine use, sleep disturbance including sleep deprivation as well as underlying sleep apnea.  I recommend reevaluation of her diagnosis of obstructive sleep apnea with a sleep test.  I outlined the differences between a laboratory attended sleep study versus home sleep test, she would prefer a home sleep test especially since she has 2 dogs at home that she cannot leave alone overnight.  She is advised to proceed with home sleep testing for that reason and we may consider AutoPap therapy or CPAP therapy.  We will keep her posted as to her results.  For now, we will plan a follow-up accordingly, I would be happy to reevaluate her for her tremor in the future as well.  I answered all her questions today and she was in agreement with our plan.   Thank you very much for allowing me to participate in the care of this nice patient. If I can be of any further assistance to you please do not hesitate to call me at [phone].  Sincerely,   Star Age, MD, PhD

## 2022-09-17 NOTE — Telephone Encounter (Signed)
Per Dr. Rexene Alberts request, sent Epworth SS to pts mychart.

## 2022-09-17 NOTE — Patient Instructions (Addendum)
You have an intermittent tremor of your left hand. I did not see any tremors today. I do not see any signs or symptoms of parkinson's like disease or what we call parkinsonism.   For your tremor, I would not recommend any new medications at this time.   Please remember, that any kind of tremor may be exacerbated by anxiety, anger, nervousness, excitement, dehydration, sleep deprivation, thyroid dysfunction, by caffeine, and low blood sugar values or blood sugar fluctuations. Some medications can exacerbate tremors, this includes certain asthma or COPD medications and certain antidepressants.   I recommend you schedule your yearly physical with blood work with Dr. Vanessa Hunter. We recommend checking the thyroid function for patients with tremor concerns.  He may also do some blood work for your cognitive complaints.   Since you have a prior diagnosis of sleep apnea, I recommend sleep testing again, as you have not been on treatment. I will order a home sleep test for re-evaluation of your sleep apnea, as testing was years ago. The home sleep test is done (HST) with portable equipment. Our sleep lab staff will reach out to you to arrange for pickup and for tutorial of the test equipment. I may recommend treatment with a so called autoPAP machine which is like a CPAP.   An attended sleep study (meaning you get to stay overnight in the sleep lab), lets [place] monitor sleep-related behaviors such as sleep talking and leg movements in sleep, in addition to monitoring for sleep apnea.  A home sleep test is a screening tool for sleep apnea diagnosis only, but unfortunately, does not help with any other sleep-related diagnoses.  Please remember, the long-term risks and ramifications of untreated moderate to severe obstructive sleep apnea may include (but are not limited to): increased risk for cardiovascular disease, including congestive heart failure, stroke, difficult to control hypertension, treatment resistant  obesity, arrhythmias, especially irregular heartbeat commonly known as A. Fib. (atrial fibrillation); even type 2 diabetes has been linked to untreated OSA.   Other correlations that untreated obstructive sleep apnea include macular edema which is swelling of the retina in the eyes, droopy eyelid syndrome, and elevated hemoglobin and hematocrit levels (often referred to as polycythemia).

## 2022-10-13 DIAGNOSIS — I491 Atrial premature depolarization: Secondary | ICD-10-CM | POA: Diagnosis not present

## 2022-10-13 DIAGNOSIS — Z6828 Body mass index (BMI) 28.0-28.9, adult: Secondary | ICD-10-CM | POA: Diagnosis not present

## 2022-10-13 DIAGNOSIS — M069 Rheumatoid arthritis, unspecified: Secondary | ICD-10-CM | POA: Diagnosis not present

## 2022-10-13 DIAGNOSIS — E782 Mixed hyperlipidemia: Secondary | ICD-10-CM | POA: Diagnosis not present

## 2022-10-13 DIAGNOSIS — R251 Tremor, unspecified: Secondary | ICD-10-CM | POA: Diagnosis not present

## 2022-10-13 DIAGNOSIS — R6889 Other general symptoms and signs: Secondary | ICD-10-CM | POA: Diagnosis not present

## 2022-10-13 DIAGNOSIS — D473 Essential (hemorrhagic) thrombocythemia: Secondary | ICD-10-CM | POA: Diagnosis not present

## 2022-10-15 DIAGNOSIS — Z86018 Personal history of other benign neoplasm: Secondary | ICD-10-CM | POA: Diagnosis not present

## 2022-10-15 DIAGNOSIS — L578 Other skin changes due to chronic exposure to nonionizing radiation: Secondary | ICD-10-CM | POA: Diagnosis not present

## 2022-10-15 DIAGNOSIS — L570 Actinic keratosis: Secondary | ICD-10-CM | POA: Diagnosis not present

## 2022-10-15 DIAGNOSIS — Z872 Personal history of diseases of the skin and subcutaneous tissue: Secondary | ICD-10-CM | POA: Diagnosis not present

## 2022-10-15 DIAGNOSIS — D1801 Hemangioma of skin and subcutaneous tissue: Secondary | ICD-10-CM | POA: Diagnosis not present

## 2022-10-15 DIAGNOSIS — D229 Melanocytic nevi, unspecified: Secondary | ICD-10-CM | POA: Diagnosis not present

## 2022-10-15 DIAGNOSIS — Z85828 Personal history of other malignant neoplasm of skin: Secondary | ICD-10-CM | POA: Diagnosis not present

## 2022-10-22 ENCOUNTER — Telehealth: Payer: Self-pay | Admitting: Neurology

## 2022-10-22 NOTE — Telephone Encounter (Signed)
Patient called and stated she did not know when her HST was scheduled. I informed her that it is scheduled for 11/03/22 she stated she wanted to cancel that.. I asked her if she wanted to r/s she stated she did not want to r/s at this time & wanted to cancel.

## 2022-10-26 ENCOUNTER — Inpatient Hospital Stay: Payer: Medicare HMO | Attending: Internal Medicine

## 2022-10-26 ENCOUNTER — Inpatient Hospital Stay: Payer: Medicare HMO | Admitting: Internal Medicine

## 2022-10-26 VITALS — BP 114/64 | HR 68 | Temp 98.2°F | Resp 15 | Wt 174.3 lb

## 2022-10-26 DIAGNOSIS — D509 Iron deficiency anemia, unspecified: Secondary | ICD-10-CM | POA: Insufficient documentation

## 2022-10-26 DIAGNOSIS — Z79899 Other long term (current) drug therapy: Secondary | ICD-10-CM | POA: Diagnosis not present

## 2022-10-26 DIAGNOSIS — D473 Essential (hemorrhagic) thrombocythemia: Secondary | ICD-10-CM

## 2022-10-26 LAB — CBC WITH DIFFERENTIAL (CANCER CENTER ONLY)
Abs Immature Granulocytes: 0.02 10*3/uL (ref 0.00–0.07)
Basophils Absolute: 0.1 10*3/uL (ref 0.0–0.1)
Basophils Relative: 1 %
Eosinophils Absolute: 0.2 10*3/uL (ref 0.0–0.5)
Eosinophils Relative: 3 %
HCT: 41.5 % (ref 36.0–46.0)
Hemoglobin: 13.6 g/dL (ref 12.0–15.0)
Immature Granulocytes: 0 %
Lymphocytes Relative: 21 %
Lymphs Abs: 1 10*3/uL (ref 0.7–4.0)
MCH: 30.4 pg (ref 26.0–34.0)
MCHC: 32.8 g/dL (ref 30.0–36.0)
MCV: 92.8 fL (ref 80.0–100.0)
Monocytes Absolute: 0.4 10*3/uL (ref 0.1–1.0)
Monocytes Relative: 9 %
Neutro Abs: 3.1 10*3/uL (ref 1.7–7.7)
Neutrophils Relative %: 66 %
Platelet Count: 407 10*3/uL — ABNORMAL HIGH (ref 150–400)
RBC: 4.47 MIL/uL (ref 3.87–5.11)
RDW: 14.4 % (ref 11.5–15.5)
WBC Count: 4.8 10*3/uL (ref 4.0–10.5)
nRBC: 0 % (ref 0.0–0.2)

## 2022-10-26 LAB — CMP (CANCER CENTER ONLY)
ALT: 19 U/L (ref 0–44)
AST: 27 U/L (ref 15–41)
Albumin: 4.3 g/dL (ref 3.5–5.0)
Alkaline Phosphatase: 70 U/L (ref 38–126)
Anion gap: 4 — ABNORMAL LOW (ref 5–15)
BUN: 16 mg/dL (ref 8–23)
CO2: 30 mmol/L (ref 22–32)
Calcium: 9.8 mg/dL (ref 8.9–10.3)
Chloride: 107 mmol/L (ref 98–111)
Creatinine: 0.96 mg/dL (ref 0.44–1.00)
GFR, Estimated: >60 mL/min (ref >60)
Glucose, Bld: 97 mg/dL (ref 70–99)
Potassium: 4.7 mmol/L (ref 3.5–5.1)
Sodium: 141 mmol/L (ref 135–145)
Total Bilirubin: 0.7 mg/dL (ref 0.3–1.2)
Total Protein: 6.8 g/dL (ref 6.5–8.1)

## 2022-10-26 LAB — LACTATE DEHYDROGENASE: LDH: 233 U/L — ABNORMAL HIGH (ref 98–192)

## 2022-10-26 NOTE — Progress Notes (Signed)
[place] [phone]   [phone]  OFFICE PROGRESS NOTE  Lurline Del, DO [place] [address]  DIAGNOSIS: Essential Thrombocythemia. [name] 2 mutation positive.    PRIOR THERAPY: None   CURRENT THERAPY: 1) Hydroxyurea 1000 mg alternating with 500 mg every other day . First dose on 07/03/20 started as 500 mg p.o. daily. 2) Integra plus 1 tablet p.o. daily    INTERVAL HISTORY: Ruth Sharp 72 y.o. female returns to the clinic today for 3 months follow-up visit.  The patient is feeling fine today with no concerning complaints.  She has been tolerating her treatment with hydroxyurea and Integra plus fairly well.  She denied having any current chest pain, shortness of breath, cough or hemoptysis.  She has no nausea, vomiting, diarrhea or constipation.  She has no bleeding, bruises or ecchymosis.  She has no recent weight loss or night sweats.  She is here today for evaluation and repeat blood work.  MEDICAL HISTORY: Past Medical History:  Diagnosis Date   Arthritis    Tachycardia     ALLERGIES:  is allergic to keflex [cephalexin] and tape.  MEDICATIONS:  Current Outpatient Medications  Medication Sig Dispense Refill   atorvastatin (LIPITOR) 20 MG tablet Take 1 tablet (20 mg total) by mouth daily. 100 tablet 3   Bacillus Coagulans-Inulin (PROBIOTIC) 1-250 BILLION-MG CAPS Take 1 capsule by mouth daily. Pt states uses Kristopher Oppenheim brand     buPROPion (WELLBUTRIN) 75 MG tablet Take 75 mg by mouth 2 (two) times daily.     Calcium Carbonate-Vitamin D (CALTRATE 600+D PO) Take 1 tablet by mouth daily.     FeFum-FePoly-FA-B Cmp-C-Biot (FOLIVANE-PLUS) CAPS TAKE ONE CAPSULE BY MOUTH ONCE DAILY 90 capsule 1   hydroxychloroquine (PLAQUENIL) 200 MG tablet Take 2 tablets (400 mg total) by mouth daily. 90 tablet 3   hydroxyurea (HYDREA) 500 MG capsule TAKE ONE CAPSULE BY MOUTH EVERY OTHER DAY alternating with TWO CAPSULES AS DIRECTED.  180 capsule 2   ibuprofen (ADVIL) 200 MG tablet Take 1 tablet by mouth daily as needed.     metoprolol tartrate (LOPRESSOR) 50 MG tablet Take 1.5 tablets (75 mg total) by mouth 2 (two) times daily. 270 tablet 4   Multiple Vitamins-Minerals (CENTRUM SILVER) CHEW Chew 1 each by mouth daily.     prochlorperazine (COMPAZINE) 10 MG tablet Take 1 tablet (10 mg total) by mouth every 6 (six) hours as needed for nausea or vomiting. 30 tablet 0   RESTASIS 0.05 % ophthalmic emulsion Place 1 drop into both eyes 2 (two) times daily.      TURMERIC PO Take by mouth.     TYRVAYA 0.03 MG/ACT SOLN Place 1 spray into both nostrils 2 (two) times daily. Per opthamology     No current facility-administered medications for this visit.    SURGICAL HISTORY:  Past Surgical History:  Procedure Laterality Date   ABDOMINAL HYSTERECTOMY     BREAST EXCISIONAL BIOPSY Right    benign   knee replacements      REVIEW OF SYSTEMS:  A comprehensive review of systems was negative.   PHYSICAL EXAMINATION: General appearance: alert, cooperative, and no distress Head: Normocephalic, without obvious abnormality, atraumatic Neck: no adenopathy, no JVD, supple, symmetrical, trachea midline, and thyroid not enlarged, symmetric, no tenderness/mass/nodules Lymph nodes: Cervical, supraclavicular, and axillary nodes normal. Resp: clear to auscultation bilaterally Back: symmetric, no curvature. ROM normal. No CVA tenderness. Cardio: regular rate and rhythm, S1,  S2 normal, no murmur, click, rub or gallop GI: soft, non-tender; bowel sounds normal; no masses,  no organomegaly Extremities: extremities normal, atraumatic, no cyanosis or edema  ECOG PERFORMANCE STATUS: 0 - Asymptomatic  Blood pressure 114/64, pulse 68, temperature 98.2 F (36.8 C), temperature source Oral, resp. rate 15, weight 174 lb 4.8 oz (79.1 kg), SpO2 98 %.  LABORATORY DATA: Lab Results  Component Value Date   WBC 4.8 10/26/2022   HGB 13.6 10/26/2022   HCT  41.5 10/26/2022   MCV 92.8 10/26/2022   PLT 407 (H) 10/26/2022      Chemistry      Component Value Date/Time   NA 141 10/26/2022 1052   NA 142 04/20/2017 1033   K 4.7 10/26/2022 1052   CL 107 10/26/2022 1052   CO2 30 10/26/2022 1052   BUN 16 10/26/2022 1052   BUN 16 04/20/2017 1033   CREATININE 0.96 10/26/2022 1052      Component Value Date/Time   CALCIUM 9.8 10/26/2022 1052   ALKPHOS 70 10/26/2022 1052   AST 27 10/26/2022 1052   ALT 19 10/26/2022 1052   BILITOT 0.7 10/26/2022 1052       RADIOGRAPHIC STUDIES: No results found.  ASSESSMENT AND PLAN: This is a very pleasant 72 years old white female recently diagnosed with essential thrombocythemia as well as iron deficiency anemia.  The patient is currently on treatment with hydroxyurea 1000 mg p.o. alternating with 500 mg every other day in addition to Integra +1 capsule p.o. daily. The patient has been tolerating her treatment with hydroxyurea and Integra plus fairly well with no concerning adverse effects. Repeat CBC today is unremarkable except for slightly elevated platelets count of 407,000 I recommended for her to continue her current treatment with hydroxyurea and Integra plus with the same dose. I will see her back for follow-up visit and 3 months for evaluation and repeat blood work. She was advised to call immediately if she has any other concerning symptoms in the interval. The patient voices understanding of current disease status and treatment options and is in agreement with the current care plan.  All questions were answered. The patient knows to call the clinic with any problems, questions or concerns. We can certainly see the patient much sooner if necessary.  Disclaimer: This note was dictated with voice recognition software. Similar sounding words can inadvertently be transcribed and may not be corrected upon review.

## 2022-10-30 ENCOUNTER — Other Ambulatory Visit: Payer: Self-pay | Admitting: Physician Assistant

## 2022-10-30 DIAGNOSIS — D75839 Thrombocytosis, unspecified: Secondary | ICD-10-CM

## 2022-11-12 ENCOUNTER — Other Ambulatory Visit: Payer: Self-pay | Admitting: Cardiovascular Disease

## 2022-11-12 DIAGNOSIS — E663 Overweight: Secondary | ICD-10-CM | POA: Diagnosis not present

## 2022-11-12 DIAGNOSIS — R002 Palpitations: Secondary | ICD-10-CM

## 2022-11-12 DIAGNOSIS — R5383 Other fatigue: Secondary | ICD-10-CM | POA: Diagnosis not present

## 2022-11-12 DIAGNOSIS — M1991 Primary osteoarthritis, unspecified site: Secondary | ICD-10-CM | POA: Diagnosis not present

## 2022-11-12 DIAGNOSIS — Z6828 Body mass index (BMI) 28.0-28.9, adult: Secondary | ICD-10-CM | POA: Diagnosis not present

## 2022-11-12 DIAGNOSIS — M25471 Effusion, right ankle: Secondary | ICD-10-CM | POA: Diagnosis not present

## 2022-11-12 DIAGNOSIS — G4733 Obstructive sleep apnea (adult) (pediatric): Secondary | ICD-10-CM

## 2022-11-12 DIAGNOSIS — I341 Nonrheumatic mitral (valve) prolapse: Secondary | ICD-10-CM

## 2022-11-12 DIAGNOSIS — M8589 Other specified disorders of bone density and structure, multiple sites: Secondary | ICD-10-CM | POA: Diagnosis not present

## 2022-11-12 DIAGNOSIS — D473 Essential (hemorrhagic) thrombocythemia: Secondary | ICD-10-CM | POA: Diagnosis not present

## 2022-11-12 DIAGNOSIS — M0579 Rheumatoid arthritis with rheumatoid factor of multiple sites without organ or systems involvement: Secondary | ICD-10-CM | POA: Diagnosis not present

## 2022-11-17 DIAGNOSIS — Z6827 Body mass index (BMI) 27.0-27.9, adult: Secondary | ICD-10-CM | POA: Diagnosis not present

## 2022-11-17 DIAGNOSIS — Z0000 Encounter for general adult medical examination without abnormal findings: Secondary | ICD-10-CM | POA: Diagnosis not present

## 2022-11-17 DIAGNOSIS — Z1211 Encounter for screening for malignant neoplasm of colon: Secondary | ICD-10-CM | POA: Diagnosis not present

## 2022-11-17 DIAGNOSIS — Z1389 Encounter for screening for other disorder: Secondary | ICD-10-CM | POA: Diagnosis not present

## 2022-11-18 ENCOUNTER — Other Ambulatory Visit: Payer: Self-pay | Admitting: Family Medicine

## 2022-11-18 DIAGNOSIS — Z1231 Encounter for screening mammogram for malignant neoplasm of breast: Secondary | ICD-10-CM

## 2022-11-25 ENCOUNTER — Telehealth: Payer: Self-pay | Admitting: Cardiovascular Disease

## 2022-11-25 DIAGNOSIS — G4733 Obstructive sleep apnea (adult) (pediatric): Secondary | ICD-10-CM

## 2022-11-25 DIAGNOSIS — R002 Palpitations: Secondary | ICD-10-CM

## 2022-11-25 DIAGNOSIS — I341 Nonrheumatic mitral (valve) prolapse: Secondary | ICD-10-CM

## 2022-11-25 NOTE — Telephone Encounter (Signed)
Patient chart as of February 2023 states Metoprolol '50mg'$  Take 1 and half tablets Twice daily.  The prescription called in 3/1 states '50mg'$  Take one tablet daily.  Want to verify as I do not see any reason for change, to see if this was an error.  Please confirm the correct dosage.  I will then send script (corrected if needed) and will also have scheduling reach out to patient to schedule her next appointment.

## 2022-11-25 NOTE — Telephone Encounter (Signed)
Pt c/o medication issue:  1. Name of Medication: metoprolol tartrate (LOPRESSOR) 50 MG tablet   2. How are you currently taking this medication (dosage and times per day)? Patient states she takes 1 1/2 tablets twice a day.   3. Are you having a reaction (difficulty breathing--STAT)? no  4. What is your medication issue? Patient states new script she recently got states to take 1 tablet twice a day.  She wants to know why it has changed.

## 2022-11-26 MED ORDER — METOPROLOL TARTRATE 50 MG PO TABS: 75.0000 mg | ORAL_TABLET | Freq: Two times a day (BID) | ORAL | 0 refills | Status: DC

## 2022-11-26 NOTE — Telephone Encounter (Signed)
Spoke with pt regarding her metoprolol tartrate (lopressor). Pt's prescription was sent in incorrectly on 3/1. Re-sent prescription in for correct dosage of lopressor '75mg'$  twice daily. Also, scheduled pt an office visit to see Dr. Gwenlyn Found back in the office, last seen in February 2023. Pt verbalizes understanding.

## 2022-12-04 DIAGNOSIS — R413 Other amnesia: Secondary | ICD-10-CM | POA: Diagnosis not present

## 2022-12-04 DIAGNOSIS — R69 Illness, unspecified: Secondary | ICD-10-CM | POA: Diagnosis not present

## 2022-12-04 DIAGNOSIS — N1831 Chronic kidney disease, stage 3a: Secondary | ICD-10-CM | POA: Diagnosis not present

## 2022-12-14 DIAGNOSIS — M542 Cervicalgia: Secondary | ICD-10-CM | POA: Diagnosis not present

## 2022-12-14 DIAGNOSIS — M25519 Pain in unspecified shoulder: Secondary | ICD-10-CM | POA: Diagnosis not present

## 2022-12-15 ENCOUNTER — Other Ambulatory Visit: Payer: Self-pay

## 2022-12-15 MED ORDER — ATORVASTATIN CALCIUM 20 MG PO TABS: 20.0000 mg | ORAL_TABLET | Freq: Every day | ORAL | 1 refills | Status: DC

## 2022-12-21 DIAGNOSIS — M25519 Pain in unspecified shoulder: Secondary | ICD-10-CM | POA: Diagnosis not present

## 2022-12-21 DIAGNOSIS — M542 Cervicalgia: Secondary | ICD-10-CM | POA: Diagnosis not present

## 2022-12-24 IMAGING — MG MM DIGITAL SCREENING BILAT W/ TOMO AND CAD
8 series · 8 of 24 positions shown · non-contrast
Comparison: Previous exam(s).

CLINICAL DATA: Screening.

EXAM:
DIGITAL SCREENING BILATERAL MAMMOGRAM WITH TOMOSYNTHESIS AND CAD
TECHNIQUE: Bilateral screening digital craniocaudal and mediolateral oblique
mammograms were obtained. Bilateral screening digital breast
tomosynthesis was performed. The images were evaluated with
computer-aided detection.

[L MLO synth-2D]
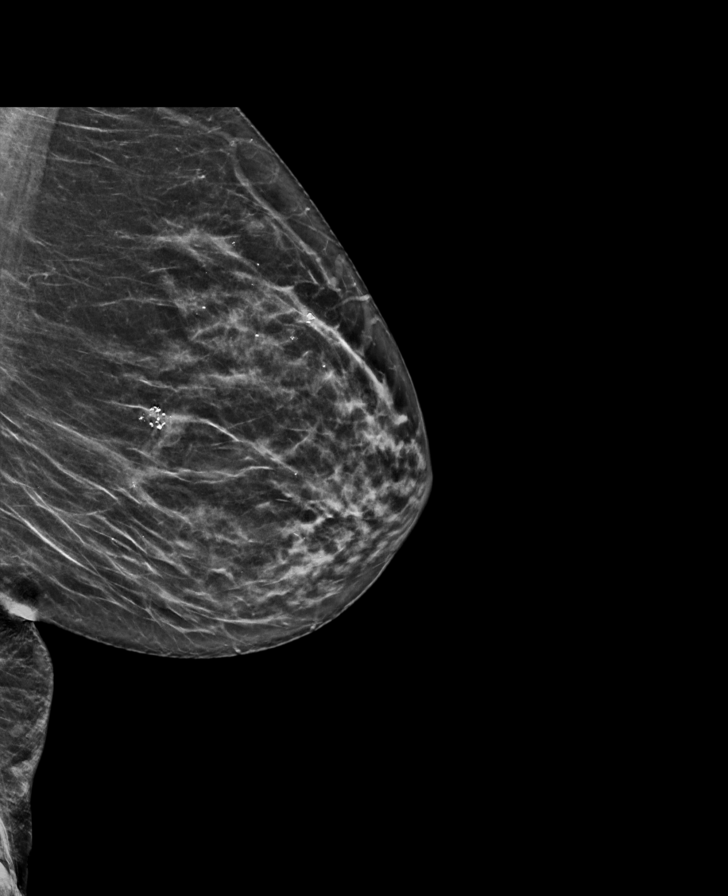

[R MLO synth-2D]
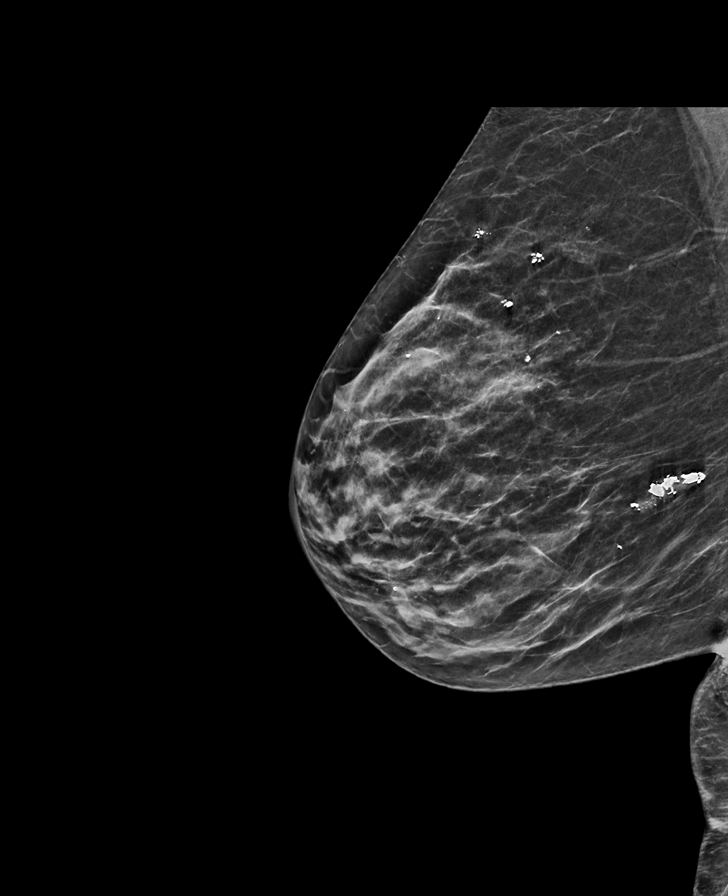

[R CC synth-2D]
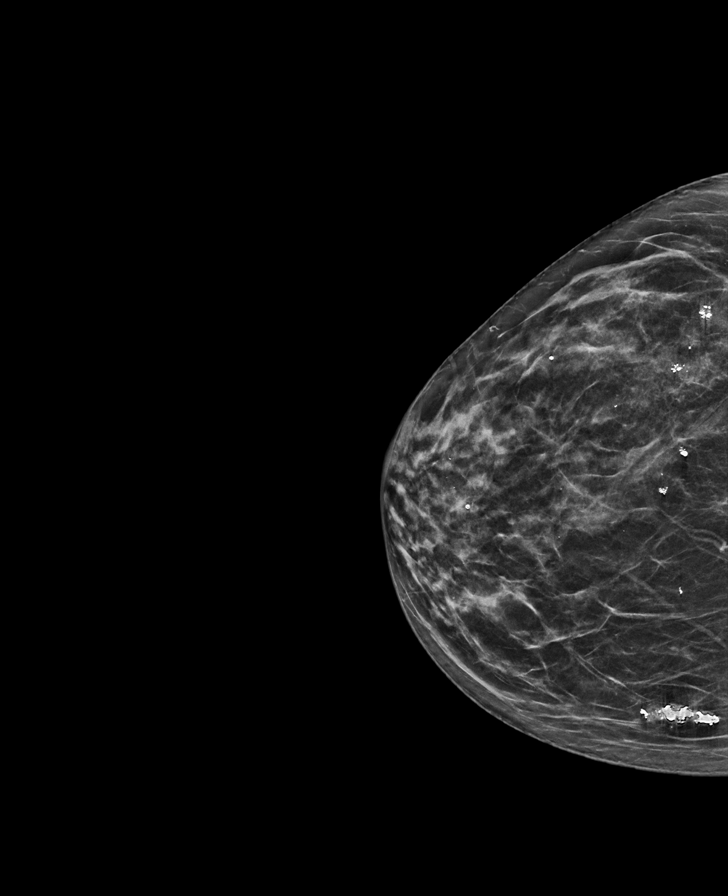

[L CC synth-2D]
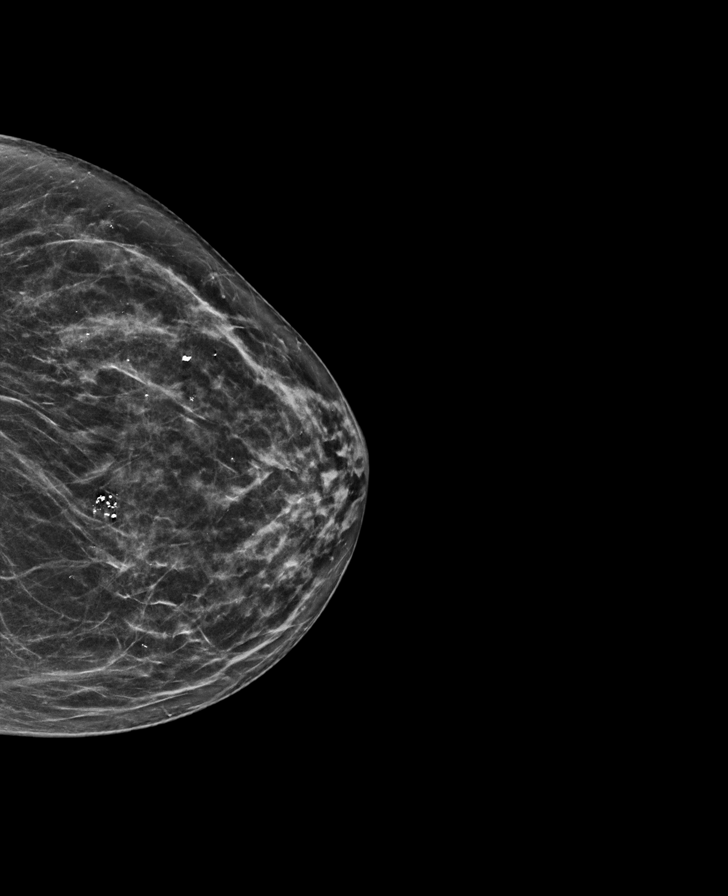

[L CC tomo · tomo slice 30/59.0]
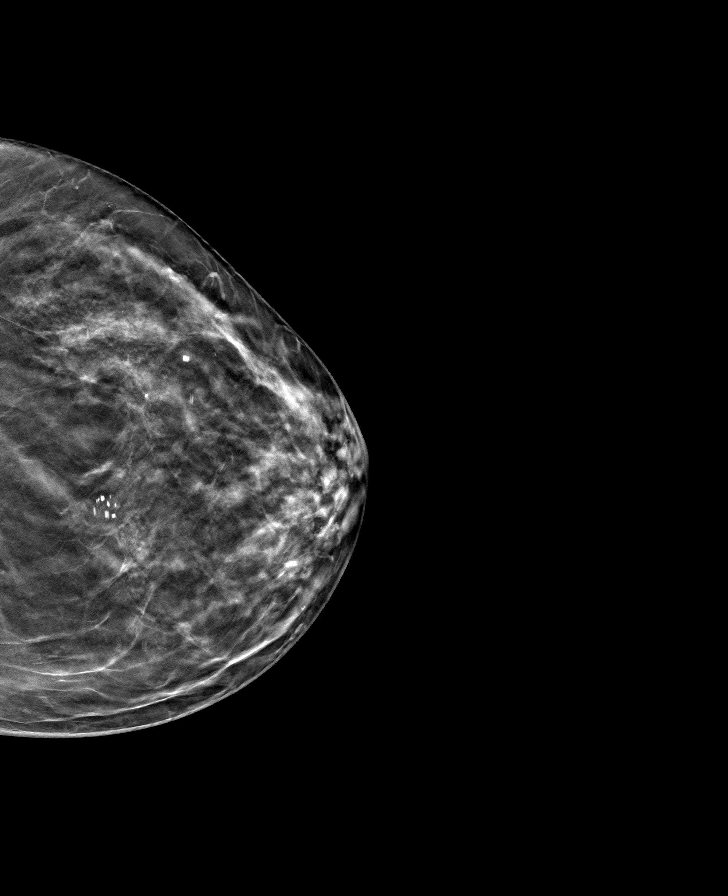

[L MLO tomo · tomo slice 31/61.0]
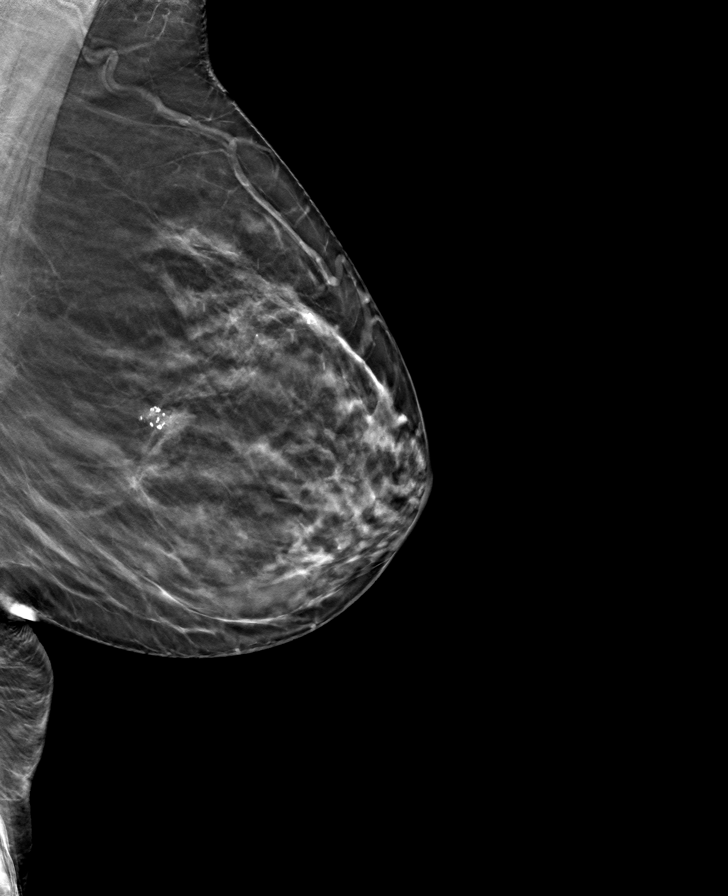

[R CC tomo · tomo slice 30/59.0]
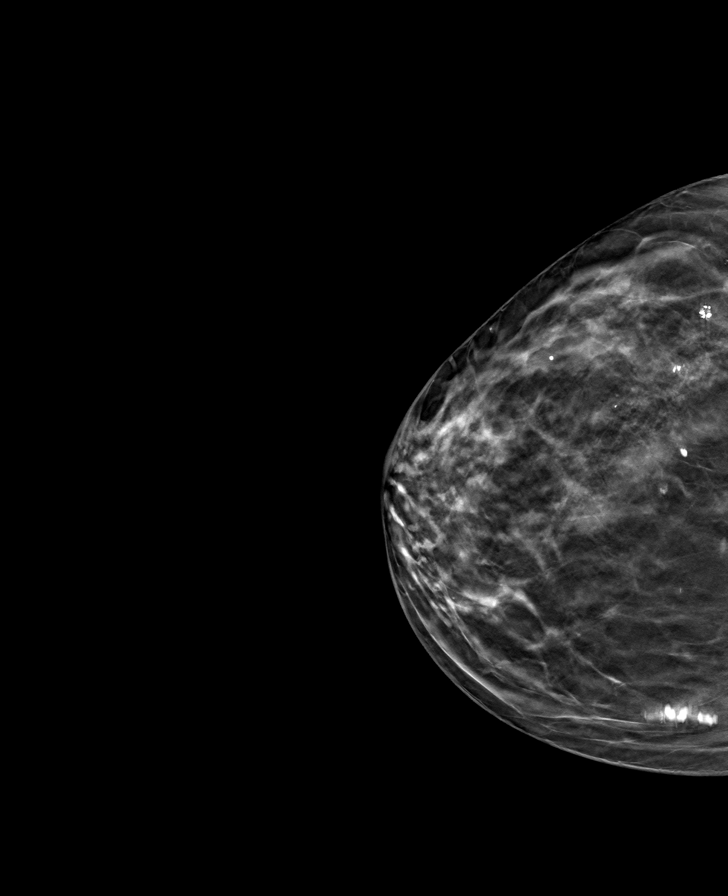

[R MLO tomo · tomo slice 29/57.0]
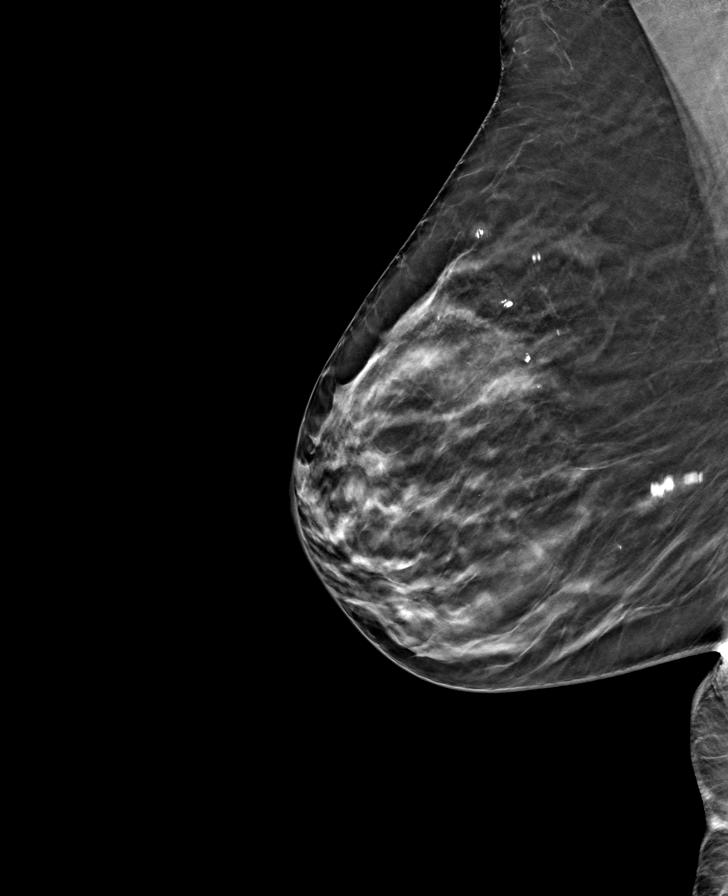

[8 of 24 positions shown; findings below may reference images not displayed]

ACR Breast Density Category c: The breast tissue is heterogeneously
dense, which may obscure small masses.
FINDINGS: There are no findings suspicious for malignancy.
IMPRESSION: No mammographic evidence of malignancy. A result letter of this
screening mammogram will be mailed directly to the patient.

RECOMMENDATION:
Screening mammogram in one year. (Code:Q3-W-BC3)

BI-RADS CATEGORY  1: Negative.

## 2022-12-28 DIAGNOSIS — M25519 Pain in unspecified shoulder: Secondary | ICD-10-CM | POA: Diagnosis not present

## 2022-12-28 DIAGNOSIS — M542 Cervicalgia: Secondary | ICD-10-CM | POA: Diagnosis not present

## 2023-01-04 DIAGNOSIS — M25519 Pain in unspecified shoulder: Secondary | ICD-10-CM | POA: Diagnosis not present

## 2023-01-04 DIAGNOSIS — M542 Cervicalgia: Secondary | ICD-10-CM | POA: Diagnosis not present

## 2023-01-11 DIAGNOSIS — M25519 Pain in unspecified shoulder: Secondary | ICD-10-CM | POA: Diagnosis not present

## 2023-01-11 DIAGNOSIS — M542 Cervicalgia: Secondary | ICD-10-CM | POA: Diagnosis not present

## 2023-01-12 ENCOUNTER — Telehealth: Payer: Self-pay | Admitting: Internal Medicine

## 2023-01-12 DIAGNOSIS — G252 Other specified forms of tremor: Secondary | ICD-10-CM | POA: Diagnosis not present

## 2023-01-12 DIAGNOSIS — F331 Major depressive disorder, recurrent, moderate: Secondary | ICD-10-CM | POA: Diagnosis not present

## 2023-01-12 NOTE — Telephone Encounter (Signed)
Called patient regarding May appointment, patient is notified. 

## 2023-01-13 ENCOUNTER — Encounter: Payer: Self-pay | Admitting: Cardiovascular Disease

## 2023-01-13 ENCOUNTER — Ambulatory Visit: Payer: Medicare HMO | Attending: Cardiovascular Disease | Admitting: Cardiovascular Disease

## 2023-01-13 VITALS — BP 108/62 | HR 60 | Ht 67.0 in | Wt 176.0 lb

## 2023-01-13 DIAGNOSIS — R002 Palpitations: Secondary | ICD-10-CM

## 2023-01-13 DIAGNOSIS — E782 Mixed hyperlipidemia: Secondary | ICD-10-CM | POA: Diagnosis not present

## 2023-01-13 LAB — HEPATIC FUNCTION PANEL
ALT: 24 IU/L (ref 0–32)
AST: 29 IU/L (ref 0–40)
Albumin: 4.5 g/dL (ref 3.8–4.8)
Alkaline Phosphatase: 69 IU/L (ref 44–121)
Bilirubin Total: 0.8 mg/dL (ref 0.0–1.2)
Bilirubin, Direct: 0.23 mg/dL (ref 0.00–0.40)
Total Protein: 6.6 g/dL (ref 6.0–8.5)

## 2023-01-13 LAB — LIPID PANEL
Chol/HDL Ratio: 1.7 ratio (ref 0.0–4.4)
Cholesterol, Total: 159 mg/dL (ref 100–199)
HDL: 93 mg/dL (ref >39)
LDL Chol Calc (NIH): 53 mg/dL (ref 0–99)
Triglycerides: 66 mg/dL (ref 0–149)
VLDL Cholesterol Cal: 13 mg/dL (ref 5–40)

## 2023-01-13 NOTE — Assessment & Plan Note (Signed)
History of hyperlipidemia on statin therapy lipid profile performed 08/05/2020 revealing total cholesterol of 120, LDL 42 and HDL of 64.  We will recheck a lipid liver profile today.

## 2023-01-13 NOTE — Assessment & Plan Note (Signed)
History of palpitations thought to be PACs improved with beta-blockade.

## 2023-01-13 NOTE — Patient Instructions (Addendum)
Medication Instructions:  Your physician recommends that you continue on your current medications as directed. Please refer to the Current Medication list given to you today.  *If you need a refill on your cardiac medications before your next appointment, please call your pharmacy*   Lab Work: Your physician recommends that you have labs drawn today: Lipids, LFTs If you have labs (blood work) drawn today and your tests are completely normal, you will receive your results only by: MyChart Message (if you have MyChart) OR A paper copy in the mail If you have any lab test that is abnormal or we need to change your treatment, we will call you to review the results.   Testing/Procedures: None   Follow-Up: At [facility], you and your health needs are our priority.  As part of our continuing mission to provide you with exceptional heart care, we have created designated Provider Care Teams.  These Care Teams include your primary Cardiologist (physician) and Advanced Practice Providers (APPs -  Physician Assistants and Nurse Practitioners) who all work together to provide you with the care you need, when you need it.   Your next appointment:    As needed  Provider:   Nanetta Batty, MD

## 2023-01-13 NOTE — Progress Notes (Signed)
01/13/2023 Ruth Sharp   12-31-1950  [id number]  Primary Physician Jackelyn Poling, DO Primary Cardiologist: Runell Gess MD FACP, [place], [place], [place]  HPI:  Ruth Sharp is a 72 y.o.  separated, mother of one child with no grandchildren who worked in a gym in the past and recently relocated from [place] down to [place] to be closer to family. She had been seeing cardiology group in [place].  I last saw her in the office 11/04/2021.  She has a history of palpitations thought to be related to PACs on event monitor on low-dose beta blocker she had a 2D echo performed 05/04/2017 that revealed normal LV systolic function, grade 2 diastolic dysfunction and mild MR without prolapse.  She did have symptoms of obstructive sleep apnea as well, and apparently had a positive sleep study but has not pursued CPAP.   I saw her a year ago she did well.  She denies chest pain or shortness of breath.  She has developed thrombocytosis and has been seeing Dr. Shirline Frees and being treated with hydroxyurea.  Her palpitations are infrequent.   Current Meds  Medication Sig   atorvastatin (LIPITOR) 20 MG tablet Take 1 tablet (20 mg total) by mouth daily.   Bacillus Coagulans-Inulin (PROBIOTIC) 1-250 BILLION-MG CAPS Take 1 capsule by mouth daily. Pt states uses Karin Golden brand   buPROPion (WELLBUTRIN) 75 MG tablet Take 75 mg by mouth 2 (two) times daily.   Calcium Carbonate-Vitamin D (CALTRATE 600+D PO) Take 1 tablet by mouth daily.   FeFum-FePoly-FA-B Cmp-C-Biot (FOLIVANE-PLUS) CAPS TAKE ONE CAPSULE BY MOUTH ONCE DAILY   hydroxychloroquine (PLAQUENIL) 200 MG tablet Take 2 tablets (400 mg total) by mouth daily.   hydroxyurea (HYDREA) 500 MG capsule TAKE ONE CAPSULE BY MOUTH EVERY OTHER DAY alternating with TWO CAPSULES AS DIRECTED.   ibuprofen (ADVIL) 200 MG tablet Take 1 tablet by mouth daily as needed.   metoprolol tartrate (LOPRESSOR) 50 MG tablet Take 1.5 tablets (75 mg total) by mouth  2 (two) times daily.   Multiple Vitamins-Minerals (CENTRUM SILVER) CHEW Chew 1 each by mouth daily.   RESTASIS 0.05 % ophthalmic emulsion Place 1 drop into both eyes 2 (two) times daily.    sertraline (ZOLOFT) 50 MG tablet Take 100 mg by mouth daily.   TURMERIC PO Take by mouth.   TYRVAYA 0.03 MG/ACT SOLN Place 1 spray into both nostrils 2 (two) times daily. Per opthamology     Allergies  Allergen Reactions   Keflex [Cephalexin] Rash   Tape Rash    Social History   Socioeconomic History   Marital status: Divorced    Spouse name: Not on file   Number of children: Not on file   Years of education: Not on file   Highest education level: Not on file  Occupational History   Not on file  Tobacco Use   Smoking status: Never   Smokeless tobacco: Never  Vaping Use   Vaping Use: Never used  Substance and Sexual Activity   Alcohol use: Yes    Comment: occasionally   Drug use: Never   Sexual activity: Not on file  Other Topics Concern   Not on file  Social History Narrative   Not on file   Social Determinants of Health   Financial Resource Strain: Not on file  Food Insecurity: Not on file  Transportation Needs: Not on file  Physical Activity: Not on file  Stress: Not on file  Social Connections: Not on  file  Intimate Partner Violence: Not on file     Review of Systems: General: negative for chills, fever, night sweats or weight changes.  Cardiovascular: negative for chest pain, dyspnea on exertion, edema, orthopnea, palpitations, paroxysmal nocturnal dyspnea or shortness of breath Dermatological: negative for rash Respiratory: negative for cough or wheezing Urologic: negative for hematuria Abdominal: negative for nausea, vomiting, diarrhea, bright red blood per rectum, melena, or hematemesis Neurologic: negative for visual changes, syncope, or dizziness All other systems reviewed and are otherwise negative except as noted above.    Blood pressure 108/62, pulse 60,  height 5\' 7"  (1.702 m), weight 176 lb (79.8 kg).  General appearance: alert and no distress Neck: no adenopathy, no carotid bruit, no JVD, supple, symmetrical, trachea midline, and thyroid not enlarged, symmetric, no tenderness/mass/nodules Lungs: clear to auscultation bilaterally Heart: regular rate and rhythm, S1, S2 normal, no murmur, click, rub or gallop Extremities: extremities normal, atraumatic, no cyanosis or edema Pulses: 2+ and symmetric Skin: Skin color, texture, turgor normal. No rashes or lesions Neurologic: Grossly normal  EKG sinus rhythm at 60 without ST or T wave changes.  I personally reviewed this EKG.  ASSESSMENT AND PLAN:   Palpitations History of palpitations thought to be PACs improved with beta-blockade.  Hyperlipidemia History of hyperlipidemia on statin therapy lipid profile performed 08/05/2020 revealing total cholesterol of 120, LDL 42 and HDL of 64.  We will recheck a lipid liver profile today.     Runell Gess MD FACP,FACC,FAHA, [facility] 01/13/2023 9:34 AM

## 2023-01-13 NOTE — Addendum Note (Signed)
Addended by: Reynolds Bowl on: 01/13/2023 09:42 AM   Modules accepted: Orders

## 2023-01-16 DIAGNOSIS — M542 Cervicalgia: Secondary | ICD-10-CM | POA: Diagnosis not present

## 2023-01-16 DIAGNOSIS — M25519 Pain in unspecified shoulder: Secondary | ICD-10-CM | POA: Diagnosis not present

## 2023-01-18 DIAGNOSIS — M542 Cervicalgia: Secondary | ICD-10-CM | POA: Diagnosis not present

## 2023-01-18 DIAGNOSIS — M25519 Pain in unspecified shoulder: Secondary | ICD-10-CM | POA: Diagnosis not present

## 2023-01-20 ENCOUNTER — Telehealth: Payer: Self-pay | Admitting: Internal Medicine

## 2023-01-20 NOTE — Telephone Encounter (Signed)
Called patient regarding May appointments, left a voicemail. ?

## 2023-01-25 ENCOUNTER — Inpatient Hospital Stay (HOSPITAL_BASED_OUTPATIENT_CLINIC_OR_DEPARTMENT_OTHER): Payer: Medicare HMO | Admitting: Internal Medicine

## 2023-01-25 ENCOUNTER — Inpatient Hospital Stay: Payer: Medicare HMO | Attending: Internal Medicine

## 2023-01-25 ENCOUNTER — Other Ambulatory Visit: Payer: Self-pay

## 2023-01-25 VITALS — BP 109/68 | HR 71 | Temp 97.4°F | Resp 14 | Wt 177.9 lb

## 2023-01-25 DIAGNOSIS — D473 Essential (hemorrhagic) thrombocythemia: Secondary | ICD-10-CM

## 2023-01-25 DIAGNOSIS — M542 Cervicalgia: Secondary | ICD-10-CM | POA: Diagnosis not present

## 2023-01-25 DIAGNOSIS — M25519 Pain in unspecified shoulder: Secondary | ICD-10-CM | POA: Diagnosis not present

## 2023-01-25 DIAGNOSIS — Z79899 Other long term (current) drug therapy: Secondary | ICD-10-CM | POA: Diagnosis not present

## 2023-01-25 DIAGNOSIS — D509 Iron deficiency anemia, unspecified: Secondary | ICD-10-CM | POA: Insufficient documentation

## 2023-01-25 LAB — LACTATE DEHYDROGENASE: LDH: 232 U/L — ABNORMAL HIGH (ref 98–192)

## 2023-01-25 LAB — CMP (CANCER CENTER ONLY)
ALT: 21 U/L (ref 0–44)
AST: 29 U/L (ref 15–41)
Albumin: 4.4 g/dL (ref 3.5–5.0)
Alkaline Phosphatase: 66 U/L (ref 38–126)
Anion gap: 6 (ref 5–15)
BUN: 22 mg/dL (ref 8–23)
CO2: 29 mmol/L (ref 22–32)
Calcium: 8.8 mg/dL — ABNORMAL LOW (ref 8.9–10.3)
Chloride: 106 mmol/L (ref 98–111)
Creatinine: 1.1 mg/dL — ABNORMAL HIGH (ref 0.44–1.00)
GFR, Estimated: 53 mL/min — ABNORMAL LOW (ref >60)
Glucose, Bld: 81 mg/dL (ref 70–99)
Potassium: 4.6 mmol/L (ref 3.5–5.1)
Sodium: 141 mmol/L (ref 135–145)
Total Bilirubin: 0.8 mg/dL (ref 0.3–1.2)
Total Protein: 7 g/dL (ref 6.5–8.1)

## 2023-01-25 LAB — CBC WITH DIFFERENTIAL (CANCER CENTER ONLY)
Abs Immature Granulocytes: 0.01 10*3/uL (ref 0.00–0.07)
Basophils Absolute: 0.1 10*3/uL (ref 0.0–0.1)
Basophils Relative: 1 %
Eosinophils Absolute: 0.2 10*3/uL (ref 0.0–0.5)
Eosinophils Relative: 3 %
HCT: 42.9 % (ref 36.0–46.0)
Hemoglobin: 13.6 g/dL (ref 12.0–15.0)
Immature Granulocytes: 0 %
Lymphocytes Relative: 20 %
Lymphs Abs: 1.1 10*3/uL (ref 0.7–4.0)
MCH: 30.2 pg (ref 26.0–34.0)
MCHC: 31.7 g/dL (ref 30.0–36.0)
MCV: 95.3 fL (ref 80.0–100.0)
Monocytes Absolute: 0.5 10*3/uL (ref 0.1–1.0)
Monocytes Relative: 10 %
Neutro Abs: 3.5 10*3/uL (ref 1.7–7.7)
Neutrophils Relative %: 66 %
Platelet Count: 377 10*3/uL (ref 150–400)
RBC: 4.5 MIL/uL (ref 3.87–5.11)
RDW: 13.8 % (ref 11.5–15.5)
WBC Count: 5.3 10*3/uL (ref 4.0–10.5)
nRBC: 0 % (ref 0.0–0.2)

## 2023-01-25 NOTE — Progress Notes (Signed)
[facility] [phone]   Fax:([phone]  OFFICE PROGRESS NOTE  Jackelyn Poling, DO [address] [place] [address]  DIAGNOSIS: Essential Thrombocythemia. [name] 2 mutation positive.    PRIOR THERAPY: None   CURRENT THERAPY: 1) Hydroxyurea 1000 mg alternating with 500 mg every other day . First dose on 07/03/20 started as 500 mg p.o. daily. 2) Integra plus 1 tablet p.o. daily    INTERVAL HISTORY: Ruth Sharp 72 y.o. female returns to the clinic today for follow-up visit.  The patient is feeling fine today with no concerning complaints.  She has been tolerating her treatment with hydroxyurea fairly well.  She denied having any current chest pain, shortness of breath, cough or hemoptysis.  She has no nausea, vomiting, diarrhea or constipation.  She has no headache or visual changes.  She has no recent weight loss or night sweats.  She is here today for evaluation with repeat blood work.  MEDICAL HISTORY: Past Medical History:  Diagnosis Date   Arthritis    Tachycardia     ALLERGIES:  is allergic to keflex [cephalexin] and tape.  MEDICATIONS:  Current Outpatient Medications  Medication Sig Dispense Refill   atorvastatin (LIPITOR) 20 MG tablet Take 1 tablet (20 mg total) by mouth daily. 30 tablet 1   Bacillus Coagulans-Inulin (PROBIOTIC) 1-250 BILLION-MG CAPS Take 1 capsule by mouth daily. Pt states uses Karin Golden brand     buPROPion (WELLBUTRIN) 75 MG tablet Take 75 mg by mouth 2 (two) times daily.     Calcium Carbonate-Vitamin D (CALTRATE 600+D PO) Take 1 tablet by mouth daily.     FeFum-FePoly-FA-B Cmp-C-Biot (FOLIVANE-PLUS) CAPS TAKE ONE CAPSULE BY MOUTH ONCE DAILY 90 capsule 1   hydroxychloroquine (PLAQUENIL) 200 MG tablet Take 2 tablets (400 mg total) by mouth daily. 90 tablet 3   hydroxyurea (HYDREA) 500 MG capsule TAKE ONE CAPSULE BY MOUTH EVERY OTHER DAY alternating with TWO CAPSULES AS DIRECTED. 180 capsule 2   ibuprofen  (ADVIL) 200 MG tablet Take 1 tablet by mouth daily as needed.     metoprolol tartrate (LOPRESSOR) 50 MG tablet Take 1.5 tablets (75 mg total) by mouth 2 (two) times daily. 270 tablet 0   Multiple Vitamins-Minerals (CENTRUM SILVER) CHEW Chew 1 each by mouth daily.     sertraline (ZOLOFT) 50 MG tablet Take 100 mg by mouth daily.     TURMERIC PO Take by mouth.     TYRVAYA 0.03 MG/ACT SOLN Place 1 spray into both nostrils 2 (two) times daily. Per opthamology     No current facility-administered medications for this visit.    SURGICAL HISTORY:  Past Surgical History:  Procedure Laterality Date   ABDOMINAL HYSTERECTOMY     BREAST EXCISIONAL BIOPSY Right    benign   knee replacements      REVIEW OF SYSTEMS:  A comprehensive review of systems was negative.   PHYSICAL EXAMINATION: General appearance: alert, cooperative, and no distress Head: Normocephalic, without obvious abnormality, atraumatic Neck: no adenopathy, no JVD, supple, symmetrical, trachea midline, and thyroid not enlarged, symmetric, no tenderness/mass/nodules Lymph nodes: Cervical, supraclavicular, and axillary nodes normal. Resp: clear to auscultation bilaterally Back: symmetric, no curvature. ROM normal. No CVA tenderness. Cardio: regular rate and rhythm, S1, S2 normal, no murmur, click, rub or gallop GI: soft, non-tender; bowel sounds normal; no masses,  no organomegaly Extremities: extremities normal, atraumatic, no cyanosis or edema  ECOG PERFORMANCE STATUS: 0 - Asymptomatic  Blood pressure 109/68,  pulse 71, temperature (!) 97.4 F (36.3 C), temperature source Oral, resp. rate 14, weight 177 lb 14.4 oz (80.7 kg), SpO2 96 %.  LABORATORY DATA: Lab Results  Component Value Date   WBC 5.3 01/25/2023   HGB 13.6 01/25/2023   HCT 42.9 01/25/2023   MCV 95.3 01/25/2023   PLT 377 01/25/2023      Chemistry      Component Value Date/Time   NA 141 10/26/2022 1052   NA 142 04/20/2017 1033   K 4.7 10/26/2022 1052   CL  107 10/26/2022 1052   CO2 30 10/26/2022 1052   BUN 16 10/26/2022 1052   BUN 16 04/20/2017 1033   CREATININE 0.96 10/26/2022 1052      Component Value Date/Time   CALCIUM 9.8 10/26/2022 1052   ALKPHOS 69 01/13/2023 0953   AST 29 01/13/2023 0953   AST 27 10/26/2022 1052   ALT 24 01/13/2023 0953   ALT 19 10/26/2022 1052   BILITOT 0.8 01/13/2023 0953   BILITOT 0.7 10/26/2022 1052       RADIOGRAPHIC STUDIES: No results found.  ASSESSMENT AND PLAN: This is a very pleasant 72 years old white female recently diagnosed with essential thrombocythemia as well as iron deficiency anemia.  The patient is currently on treatment with hydroxyurea 1000 mg p.o. alternating with 500 mg every other day in addition to Integra +1 capsule p.o. daily. The patient has been tolerating her treatment with hydroxyurea fairly well with no concerning adverse effects. Repeat CBC today is unremarkable for any abnormality. I recommended for her to continue her current treatment with hydroxyurea with the same dose. I will see her back for follow-up visit in 3 months for evaluation and repeat blood work. She was advised to call immediately if she has any concerning symptoms in the interval. The patient voices understanding of current disease status and treatment options and is in agreement with the current care plan.  All questions were answered. The patient knows to call the clinic with any problems, questions or concerns. We can certainly see the patient much sooner if necessary.  Disclaimer: This note was dictated with voice recognition software. Similar sounding words can inadvertently be transcribed and may not be corrected upon review.

## 2023-02-01 DIAGNOSIS — M542 Cervicalgia: Secondary | ICD-10-CM | POA: Diagnosis not present

## 2023-02-01 DIAGNOSIS — M25519 Pain in unspecified shoulder: Secondary | ICD-10-CM | POA: Diagnosis not present

## 2023-02-10 ENCOUNTER — Other Ambulatory Visit: Payer: Self-pay | Admitting: Cardiovascular Disease

## 2023-02-10 DIAGNOSIS — I341 Nonrheumatic mitral (valve) prolapse: Secondary | ICD-10-CM

## 2023-02-10 DIAGNOSIS — G4733 Obstructive sleep apnea (adult) (pediatric): Secondary | ICD-10-CM

## 2023-02-10 DIAGNOSIS — R002 Palpitations: Secondary | ICD-10-CM

## 2023-02-11 DIAGNOSIS — M542 Cervicalgia: Secondary | ICD-10-CM | POA: Diagnosis not present

## 2023-02-11 DIAGNOSIS — M25519 Pain in unspecified shoulder: Secondary | ICD-10-CM | POA: Diagnosis not present

## 2023-02-15 DIAGNOSIS — M542 Cervicalgia: Secondary | ICD-10-CM | POA: Diagnosis not present

## 2023-02-15 DIAGNOSIS — M25519 Pain in unspecified shoulder: Secondary | ICD-10-CM | POA: Diagnosis not present

## 2023-02-22 DIAGNOSIS — M542 Cervicalgia: Secondary | ICD-10-CM | POA: Diagnosis not present

## 2023-02-22 DIAGNOSIS — M25519 Pain in unspecified shoulder: Secondary | ICD-10-CM | POA: Diagnosis not present

## 2023-03-01 DIAGNOSIS — M542 Cervicalgia: Secondary | ICD-10-CM | POA: Diagnosis not present

## 2023-03-01 DIAGNOSIS — M25519 Pain in unspecified shoulder: Secondary | ICD-10-CM | POA: Diagnosis not present

## 2023-03-02 ENCOUNTER — Other Ambulatory Visit: Payer: Self-pay | Admitting: Cardiovascular Disease

## 2023-03-08 DIAGNOSIS — M25519 Pain in unspecified shoulder: Secondary | ICD-10-CM | POA: Diagnosis not present

## 2023-03-08 DIAGNOSIS — M542 Cervicalgia: Secondary | ICD-10-CM | POA: Diagnosis not present

## 2023-03-12 DIAGNOSIS — F331 Major depressive disorder, recurrent, moderate: Secondary | ICD-10-CM | POA: Diagnosis not present

## 2023-03-12 DIAGNOSIS — W19XXXA Unspecified fall, initial encounter: Secondary | ICD-10-CM | POA: Diagnosis not present

## 2023-03-22 DIAGNOSIS — M542 Cervicalgia: Secondary | ICD-10-CM | POA: Diagnosis not present

## 2023-03-22 DIAGNOSIS — M25519 Pain in unspecified shoulder: Secondary | ICD-10-CM | POA: Diagnosis not present

## 2023-03-29 DIAGNOSIS — M25519 Pain in unspecified shoulder: Secondary | ICD-10-CM | POA: Diagnosis not present

## 2023-03-29 DIAGNOSIS — M542 Cervicalgia: Secondary | ICD-10-CM | POA: Diagnosis not present

## 2023-04-08 DIAGNOSIS — M542 Cervicalgia: Secondary | ICD-10-CM | POA: Diagnosis not present

## 2023-04-08 DIAGNOSIS — M25519 Pain in unspecified shoulder: Secondary | ICD-10-CM | POA: Diagnosis not present

## 2023-04-12 DIAGNOSIS — M25519 Pain in unspecified shoulder: Secondary | ICD-10-CM | POA: Diagnosis not present

## 2023-04-12 DIAGNOSIS — M542 Cervicalgia: Secondary | ICD-10-CM | POA: Diagnosis not present

## 2023-04-19 DIAGNOSIS — M25519 Pain in unspecified shoulder: Secondary | ICD-10-CM | POA: Diagnosis not present

## 2023-04-19 DIAGNOSIS — M542 Cervicalgia: Secondary | ICD-10-CM | POA: Diagnosis not present

## 2023-04-26 ENCOUNTER — Inpatient Hospital Stay (HOSPITAL_BASED_OUTPATIENT_CLINIC_OR_DEPARTMENT_OTHER): Payer: Medicare HMO | Admitting: Internal Medicine

## 2023-04-26 ENCOUNTER — Inpatient Hospital Stay: Payer: Medicare HMO | Attending: Internal Medicine

## 2023-04-26 ENCOUNTER — Other Ambulatory Visit: Payer: Self-pay

## 2023-04-26 VITALS — BP 105/67 | HR 61 | Temp 97.5°F | Resp 17 | Ht 67.0 in | Wt 182.9 lb

## 2023-04-26 DIAGNOSIS — M542 Cervicalgia: Secondary | ICD-10-CM | POA: Diagnosis not present

## 2023-04-26 DIAGNOSIS — Z79899 Other long term (current) drug therapy: Secondary | ICD-10-CM | POA: Diagnosis not present

## 2023-04-26 DIAGNOSIS — D473 Essential (hemorrhagic) thrombocythemia: Secondary | ICD-10-CM | POA: Diagnosis not present

## 2023-04-26 DIAGNOSIS — D509 Iron deficiency anemia, unspecified: Secondary | ICD-10-CM | POA: Insufficient documentation

## 2023-04-26 DIAGNOSIS — M25519 Pain in unspecified shoulder: Secondary | ICD-10-CM | POA: Diagnosis not present

## 2023-04-26 LAB — CMP (CANCER CENTER ONLY)
ALT: 19 U/L (ref 0–44)
AST: 25 U/L (ref 15–41)
Albumin: 4.3 g/dL (ref 3.5–5.0)
Alkaline Phosphatase: 64 U/L (ref 38–126)
Anion gap: 6 (ref 5–15)
BUN: 20 mg/dL (ref 8–23)
CO2: 28 mmol/L (ref 22–32)
Calcium: 9.1 mg/dL (ref 8.9–10.3)
Chloride: 109 mmol/L (ref 98–111)
Creatinine: 0.95 mg/dL (ref 0.44–1.00)
GFR, Estimated: >60 mL/min (ref >60)
Glucose, Bld: 97 mg/dL (ref 70–99)
Potassium: 4.7 mmol/L (ref 3.5–5.1)
Sodium: 143 mmol/L (ref 135–145)
Total Bilirubin: 0.7 mg/dL (ref 0.3–1.2)
Total Protein: 6.8 g/dL (ref 6.5–8.1)

## 2023-04-26 LAB — CBC WITH DIFFERENTIAL (CANCER CENTER ONLY)
Abs Immature Granulocytes: 0.01 10*3/uL (ref 0.00–0.07)
Basophils Absolute: 0.1 10*3/uL (ref 0.0–0.1)
Basophils Relative: 1 %
Eosinophils Absolute: 0.2 10*3/uL (ref 0.0–0.5)
Eosinophils Relative: 3 %
HCT: 42.7 % (ref 36.0–46.0)
Hemoglobin: 13.9 g/dL (ref 12.0–15.0)
Immature Granulocytes: 0 %
Lymphocytes Relative: 17 %
Lymphs Abs: 0.9 10*3/uL (ref 0.7–4.0)
MCH: 29.7 pg (ref 26.0–34.0)
MCHC: 32.6 g/dL (ref 30.0–36.0)
MCV: 91.2 fL (ref 80.0–100.0)
Monocytes Absolute: 0.5 10*3/uL (ref 0.1–1.0)
Monocytes Relative: 10 %
Neutro Abs: 3.4 10*3/uL (ref 1.7–7.7)
Neutrophils Relative %: 69 %
Platelet Count: 417 10*3/uL — ABNORMAL HIGH (ref 150–400)
RBC: 4.68 MIL/uL (ref 3.87–5.11)
RDW: 14.6 % (ref 11.5–15.5)
WBC Count: 5 10*3/uL (ref 4.0–10.5)
nRBC: 0 % (ref 0.0–0.2)

## 2023-04-26 LAB — LACTATE DEHYDROGENASE: LDH: 228 U/L — ABNORMAL HIGH (ref 98–192)

## 2023-04-26 NOTE — Progress Notes (Signed)
[facility] [phone]   Fax:([phone]  OFFICE PROGRESS NOTE  Jackelyn Poling, DO [address] [place] [address]  DIAGNOSIS: Essential Thrombocythemia. [name] 2 mutation positive.    PRIOR THERAPY: None   CURRENT THERAPY: 1) Hydroxyurea 1000 mg alternating with 500 mg every other day . First dose on 07/03/20 started as 500 mg p.o. daily. 2) Integra plus 1 tablet p.o. daily   INTERVAL HISTORY: Ruth Sharp 72 y.o. female turns to the clinic today for 10-month follow-up visit.  The patient is feeling fine today with no concerning complaints.  She denied having any current chest pain, shortness of breath, cough or hemoptysis.  She has no nausea, vomiting, diarrhea or constipation.  She has no headache or visual changes.  She denied having any fever or chills.  She is here today for evaluation with repeat CBC, comprehensive metabolic panel and LDH.  MEDICAL HISTORY: Past Medical History:  Diagnosis Date   Arthritis    Tachycardia     ALLERGIES:  is allergic to keflex [cephalexin] and tape.  MEDICATIONS:  Current Outpatient Medications  Medication Sig Dispense Refill   atorvastatin (LIPITOR) 20 MG tablet TAKE ONE TABLET BY MOUTH ONCE DAILY 90 tablet 3   Bacillus Coagulans-Inulin (PROBIOTIC) 1-250 BILLION-MG CAPS Take 1 capsule by mouth daily. Pt states uses Karin Golden brand     buPROPion (WELLBUTRIN) 75 MG tablet Take 75 mg by mouth 2 (two) times daily.     Calcium Carbonate-Vitamin D (CALTRATE 600+D PO) Take 1 tablet by mouth daily.     FeFum-FePoly-FA-B Cmp-C-Biot (FOLIVANE-PLUS) CAPS TAKE ONE CAPSULE BY MOUTH ONCE DAILY 90 capsule 1   hydroxychloroquine (PLAQUENIL) 200 MG tablet Take 2 tablets (400 mg total) by mouth daily. 90 tablet 3   hydroxyurea (HYDREA) 500 MG capsule TAKE ONE CAPSULE BY MOUTH EVERY OTHER DAY alternating with TWO CAPSULES AS DIRECTED. 180 capsule 2   ibuprofen (ADVIL) 200 MG tablet Take 1 tablet by mouth daily  as needed.     metoprolol tartrate (LOPRESSOR) 50 MG tablet TAKE 1 AND 1/2 TABLETS BY MOUTH TWICE DAILY 270 tablet 0   Multiple Vitamins-Minerals (CENTRUM SILVER) CHEW Chew 1 each by mouth daily.     sertraline (ZOLOFT) 50 MG tablet Take 100 mg by mouth daily.     TURMERIC PO Take by mouth.     TYRVAYA 0.03 MG/ACT SOLN Place 1 spray into both nostrils 2 (two) times daily. Per opthamology     No current facility-administered medications for this visit.    SURGICAL HISTORY:  Past Surgical History:  Procedure Laterality Date   ABDOMINAL HYSTERECTOMY     BREAST EXCISIONAL BIOPSY Right    benign   knee replacements      REVIEW OF SYSTEMS:  A comprehensive review of systems was negative.   PHYSICAL EXAMINATION: General appearance: alert, cooperative, and no distress Head: Normocephalic, without obvious abnormality, atraumatic Neck: no adenopathy, no JVD, supple, symmetrical, trachea midline, and thyroid not enlarged, symmetric, no tenderness/mass/nodules Lymph nodes: Cervical, supraclavicular, and axillary nodes normal. Resp: clear to auscultation bilaterally Back: symmetric, no curvature. ROM normal. No CVA tenderness. Cardio: regular rate and rhythm, S1, S2 normal, no murmur, click, rub or gallop GI: soft, non-tender; bowel sounds normal; no masses,  no organomegaly Extremities: extremities normal, atraumatic, no cyanosis or edema  ECOG PERFORMANCE STATUS: 0 - Asymptomatic  Blood pressure 105/67, pulse 61, temperature (!) 97.5 F (36.4 C), temperature source Oral, resp. rate 17,  height 5\' 7"  (1.702 m), weight 182 lb 14.4 oz (83 kg), SpO2 98%.  LABORATORY DATA: Lab Results  Component Value Date   WBC 5.0 04/26/2023   HGB 13.9 04/26/2023   HCT 42.7 04/26/2023   MCV 91.2 04/26/2023   PLT 417 (H) 04/26/2023      Chemistry      Component Value Date/Time   NA 141 01/25/2023 1321   NA 142 04/20/2017 1033   K 4.6 01/25/2023 1321   CL 106 01/25/2023 1321   CO2 29 01/25/2023  1321   BUN 22 01/25/2023 1321   BUN 16 04/20/2017 1033   CREATININE 1.10 (H) 01/25/2023 1321      Component Value Date/Time   CALCIUM 8.8 (L) 01/25/2023 1321   ALKPHOS 66 01/25/2023 1321   AST 29 01/25/2023 1321   ALT 21 01/25/2023 1321   BILITOT 0.8 01/25/2023 1321       RADIOGRAPHIC STUDIES: No results found.  ASSESSMENT AND PLAN: This is a very pleasant 72 years old white female recently diagnosed with essential thrombocythemia as well as iron deficiency anemia.  The patient is currently on treatment with hydroxyurea 1000 mg p.o. alternating with 500 mg every other day in addition to Integra +1 capsule p.o. daily. The patient continues to tolerate her treatment with hydroxyurea and Integra plus fairly well. Repeat CBC today showed mild increase in her platelets count that need to be monitored closely on upcoming blood work. I recommended for her to continue her current treatment with hydroxyurea on Integra plus as before. I will see her back for follow-up visit in 3 months for evaluation and repeat blood work. She was advised to call immediately if she has any other concerning symptoms in the interval. The patient voices understanding of current disease status and treatment options and is in agreement with the current care plan.  All questions were answered. The patient knows to call the clinic with any problems, questions or concerns. We can certainly see the patient much sooner if necessary.  Disclaimer: This note was dictated with voice recognition software. Similar sounding words can inadvertently be transcribed and may not be corrected upon review.

## 2023-04-28 ENCOUNTER — Other Ambulatory Visit: Payer: Self-pay | Admitting: Cardiovascular Disease

## 2023-04-28 ENCOUNTER — Other Ambulatory Visit: Payer: Self-pay | Admitting: Physician Assistant

## 2023-04-28 DIAGNOSIS — I341 Nonrheumatic mitral (valve) prolapse: Secondary | ICD-10-CM

## 2023-04-28 DIAGNOSIS — G4733 Obstructive sleep apnea (adult) (pediatric): Secondary | ICD-10-CM

## 2023-04-28 DIAGNOSIS — K219 Gastro-esophageal reflux disease without esophagitis: Secondary | ICD-10-CM | POA: Diagnosis not present

## 2023-04-28 DIAGNOSIS — R296 Repeated falls: Secondary | ICD-10-CM | POA: Diagnosis not present

## 2023-04-28 DIAGNOSIS — R002 Palpitations: Secondary | ICD-10-CM

## 2023-04-28 DIAGNOSIS — Z6829 Body mass index (BMI) 29.0-29.9, adult: Secondary | ICD-10-CM | POA: Diagnosis not present

## 2023-04-28 DIAGNOSIS — Z9181 History of falling: Secondary | ICD-10-CM | POA: Diagnosis not present

## 2023-04-28 DIAGNOSIS — D75839 Thrombocytosis, unspecified: Secondary | ICD-10-CM

## 2023-04-28 DIAGNOSIS — R072 Precordial pain: Secondary | ICD-10-CM | POA: Diagnosis not present

## 2023-05-03 ENCOUNTER — Ambulatory Visit
Admission: RE | Admit: 2023-05-03 | Discharge: 2023-05-03 | Disposition: A | Discharge status: Home / Self Care | Payer: Medicare HMO | Source: Ambulatory Visit | Attending: Family Medicine | Admitting: Family Medicine

## 2023-05-03 ENCOUNTER — Other Ambulatory Visit: Payer: Self-pay | Admitting: Family Medicine

## 2023-05-03 DIAGNOSIS — R296 Repeated falls: Secondary | ICD-10-CM

## 2023-05-03 DIAGNOSIS — M25519 Pain in unspecified shoulder: Secondary | ICD-10-CM | POA: Diagnosis not present

## 2023-05-03 DIAGNOSIS — M542 Cervicalgia: Secondary | ICD-10-CM | POA: Diagnosis not present

## 2023-05-03 DIAGNOSIS — S2232XA Fracture of one rib, left side, initial encounter for closed fracture: Secondary | ICD-10-CM | POA: Diagnosis not present

## 2023-05-10 DIAGNOSIS — M25519 Pain in unspecified shoulder: Secondary | ICD-10-CM | POA: Diagnosis not present

## 2023-05-10 DIAGNOSIS — M542 Cervicalgia: Secondary | ICD-10-CM | POA: Diagnosis not present

## 2023-05-13 DIAGNOSIS — M25471 Effusion, right ankle: Secondary | ICD-10-CM | POA: Diagnosis not present

## 2023-05-13 DIAGNOSIS — M8589 Other specified disorders of bone density and structure, multiple sites: Secondary | ICD-10-CM | POA: Diagnosis not present

## 2023-05-13 DIAGNOSIS — R5383 Other fatigue: Secondary | ICD-10-CM | POA: Diagnosis not present

## 2023-05-13 DIAGNOSIS — Z6830 Body mass index (BMI) 30.0-30.9, adult: Secondary | ICD-10-CM | POA: Diagnosis not present

## 2023-05-13 DIAGNOSIS — M1991 Primary osteoarthritis, unspecified site: Secondary | ICD-10-CM | POA: Diagnosis not present

## 2023-05-13 DIAGNOSIS — E669 Obesity, unspecified: Secondary | ICD-10-CM | POA: Diagnosis not present

## 2023-05-13 DIAGNOSIS — D473 Essential (hemorrhagic) thrombocythemia: Secondary | ICD-10-CM | POA: Diagnosis not present

## 2023-05-13 DIAGNOSIS — M0579 Rheumatoid arthritis with rheumatoid factor of multiple sites without organ or systems involvement: Secondary | ICD-10-CM | POA: Diagnosis not present

## 2023-05-19 DIAGNOSIS — H16223 Keratoconjunctivitis sicca, not specified as Sjogren's, bilateral: Secondary | ICD-10-CM | POA: Diagnosis not present

## 2023-05-19 DIAGNOSIS — Z961 Presence of intraocular lens: Secondary | ICD-10-CM | POA: Diagnosis not present

## 2023-05-19 DIAGNOSIS — Z79899 Other long term (current) drug therapy: Secondary | ICD-10-CM | POA: Diagnosis not present

## 2023-05-20 ENCOUNTER — Other Ambulatory Visit: Payer: Self-pay | Admitting: Cardiovascular Disease

## 2023-05-20 DIAGNOSIS — I341 Nonrheumatic mitral (valve) prolapse: Secondary | ICD-10-CM

## 2023-05-20 DIAGNOSIS — G4733 Obstructive sleep apnea (adult) (pediatric): Secondary | ICD-10-CM

## 2023-05-20 DIAGNOSIS — R002 Palpitations: Secondary | ICD-10-CM

## 2023-05-24 DIAGNOSIS — M25519 Pain in unspecified shoulder: Secondary | ICD-10-CM | POA: Diagnosis not present

## 2023-05-24 DIAGNOSIS — M542 Cervicalgia: Secondary | ICD-10-CM | POA: Diagnosis not present

## 2023-05-26 ENCOUNTER — Other Ambulatory Visit: Payer: Self-pay | Admitting: Internal Medicine

## 2023-05-26 DIAGNOSIS — D473 Essential (hemorrhagic) thrombocythemia: Secondary | ICD-10-CM

## 2023-05-31 DIAGNOSIS — M542 Cervicalgia: Secondary | ICD-10-CM | POA: Diagnosis not present

## 2023-05-31 DIAGNOSIS — M25519 Pain in unspecified shoulder: Secondary | ICD-10-CM | POA: Diagnosis not present

## 2023-06-07 DIAGNOSIS — M25519 Pain in unspecified shoulder: Secondary | ICD-10-CM | POA: Diagnosis not present

## 2023-06-07 DIAGNOSIS — M542 Cervicalgia: Secondary | ICD-10-CM | POA: Diagnosis not present

## 2023-06-14 DIAGNOSIS — M25519 Pain in unspecified shoulder: Secondary | ICD-10-CM | POA: Diagnosis not present

## 2023-06-14 DIAGNOSIS — M542 Cervicalgia: Secondary | ICD-10-CM | POA: Diagnosis not present

## 2023-06-21 DIAGNOSIS — M542 Cervicalgia: Secondary | ICD-10-CM | POA: Diagnosis not present

## 2023-06-21 DIAGNOSIS — M25519 Pain in unspecified shoulder: Secondary | ICD-10-CM | POA: Diagnosis not present

## 2023-06-28 DIAGNOSIS — M25519 Pain in unspecified shoulder: Secondary | ICD-10-CM | POA: Diagnosis not present

## 2023-06-28 DIAGNOSIS — M542 Cervicalgia: Secondary | ICD-10-CM | POA: Diagnosis not present

## 2023-07-05 DIAGNOSIS — M542 Cervicalgia: Secondary | ICD-10-CM | POA: Diagnosis not present

## 2023-07-05 DIAGNOSIS — M25519 Pain in unspecified shoulder: Secondary | ICD-10-CM | POA: Diagnosis not present

## 2023-07-12 DIAGNOSIS — M542 Cervicalgia: Secondary | ICD-10-CM | POA: Diagnosis not present

## 2023-07-12 DIAGNOSIS — M25519 Pain in unspecified shoulder: Secondary | ICD-10-CM | POA: Diagnosis not present

## 2023-07-26 ENCOUNTER — Inpatient Hospital Stay: Payer: Medicare HMO | Admitting: Internal Medicine

## 2023-07-26 ENCOUNTER — Inpatient Hospital Stay: Payer: Medicare HMO | Attending: Internal Medicine

## 2023-07-26 VITALS — BP 110/68 | HR 66 | Temp 97.9°F | Resp 17 | Ht 67.0 in | Wt 187.4 lb

## 2023-07-26 DIAGNOSIS — R7402 Elevation of levels of lactic acid dehydrogenase (LDH): Secondary | ICD-10-CM | POA: Diagnosis not present

## 2023-07-26 DIAGNOSIS — D473 Essential (hemorrhagic) thrombocythemia: Secondary | ICD-10-CM

## 2023-07-26 DIAGNOSIS — R739 Hyperglycemia, unspecified: Secondary | ICD-10-CM | POA: Diagnosis not present

## 2023-07-26 DIAGNOSIS — D509 Iron deficiency anemia, unspecified: Secondary | ICD-10-CM | POA: Diagnosis not present

## 2023-07-26 DIAGNOSIS — Z79899 Other long term (current) drug therapy: Secondary | ICD-10-CM | POA: Diagnosis not present

## 2023-07-26 DIAGNOSIS — M25519 Pain in unspecified shoulder: Secondary | ICD-10-CM | POA: Diagnosis not present

## 2023-07-26 DIAGNOSIS — M542 Cervicalgia: Secondary | ICD-10-CM | POA: Diagnosis not present

## 2023-07-26 LAB — CMP (CANCER CENTER ONLY)
ALT: 16 U/L (ref 0–44)
AST: 24 U/L (ref 15–41)
Albumin: 4.4 g/dL (ref 3.5–5.0)
Alkaline Phosphatase: 74 U/L (ref 38–126)
Anion gap: 5 (ref 5–15)
BUN: 18 mg/dL (ref 8–23)
CO2: 30 mmol/L (ref 22–32)
Calcium: 9.3 mg/dL (ref 8.9–10.3)
Chloride: 108 mmol/L (ref 98–111)
Creatinine: 0.92 mg/dL (ref 0.44–1.00)
GFR, Estimated: >60 mL/min (ref >60)
Glucose, Bld: 102 mg/dL — ABNORMAL HIGH (ref 70–99)
Potassium: 4.3 mmol/L (ref 3.5–5.1)
Sodium: 143 mmol/L (ref 135–145)
Total Bilirubin: 0.8 mg/dL (ref <1.2)
Total Protein: 7 g/dL (ref 6.5–8.1)

## 2023-07-26 LAB — CBC WITH DIFFERENTIAL (CANCER CENTER ONLY)
Abs Immature Granulocytes: 0.01 10*3/uL (ref 0.00–0.07)
Basophils Absolute: 0.1 10*3/uL (ref 0.0–0.1)
Basophils Relative: 1 %
Eosinophils Absolute: 0.2 10*3/uL (ref 0.0–0.5)
Eosinophils Relative: 4 %
HCT: 43.5 % (ref 36.0–46.0)
Hemoglobin: 14.2 g/dL (ref 12.0–15.0)
Immature Granulocytes: 0 %
Lymphocytes Relative: 19 %
Lymphs Abs: 0.8 10*3/uL (ref 0.7–4.0)
MCH: 30.4 pg (ref 26.0–34.0)
MCHC: 32.6 g/dL (ref 30.0–36.0)
MCV: 93.1 fL (ref 80.0–100.0)
Monocytes Absolute: 0.3 10*3/uL (ref 0.1–1.0)
Monocytes Relative: 8 %
Neutro Abs: 2.9 10*3/uL (ref 1.7–7.7)
Neutrophils Relative %: 68 %
Platelet Count: 359 10*3/uL (ref 150–400)
RBC: 4.67 MIL/uL (ref 3.87–5.11)
RDW: 14.9 % (ref 11.5–15.5)
WBC Count: 4.2 10*3/uL (ref 4.0–10.5)
nRBC: 0 % (ref 0.0–0.2)

## 2023-07-26 LAB — LACTATE DEHYDROGENASE: LDH: 221 U/L — ABNORMAL HIGH (ref 98–192)

## 2023-07-26 NOTE — Progress Notes (Signed)
[facility] Telephone:([phone]   Fax:([phone]  OFFICE PROGRESS NOTE  Jackelyn Poling, DO [address] [place] [address]  DIAGNOSIS: Essential Thrombocythemia. [name] 2 mutation positive.    PRIOR THERAPY: None   CURRENT THERAPY: 1) Hydroxyurea 1000 mg alternating with 500 mg every other day . First dose on 07/03/20 started as 500 mg p.o. daily. 2) Integra plus 1 tablet p.o. daily  INTERVAL HISTORY: Ruth Sharp 72 y.o. female returns to the clinic today for follow-up visit.Discussed the use of AI scribe software for clinical note transcription with the patient, who gave verbal consent to proceed.  History of Present Illness   Ariam Bosier, a 72 year old patient with a history of essential thrombocytopenia and iron deficiency anemia, reports feeling well with no current complaints. The patient denies experiencing chest pain, shortness of breath, nausea, vomiting, or diarrhea. There is no mention of bleeding or bruising. The patient is currently on a regimen of hydroxyurea, taking 500mg  alternating with 1000mg  every other day. The patient's blood sugar is slightly elevated, and LDH is also slightly elevated but has been higher in the past. Despite these slight elevations, the patient reports feeling good and has no complaints.       MEDICAL HISTORY: Past Medical History:  Diagnosis Date   Arthritis    Tachycardia     ALLERGIES:  is allergic to keflex [cephalexin] and tape.  MEDICATIONS:  Current Outpatient Medications  Medication Sig Dispense Refill   atorvastatin (LIPITOR) 20 MG tablet TAKE ONE TABLET BY MOUTH ONCE DAILY 90 tablet 3   Bacillus Coagulans-Inulin (PROBIOTIC) 1-250 BILLION-MG CAPS Take 1 capsule by mouth daily. Pt states uses Karin Golden brand     buPROPion (WELLBUTRIN) 75 MG tablet Take 75 mg by mouth 2 (two) times daily.     Calcium Carbonate-Vitamin D (CALTRATE 600+D PO) Take 1 tablet by mouth daily.     FeFum-FePoly-FA-B  Cmp-C-Biot (FOLIVANE-PLUS) CAPS TAKE ONE CAPSULE BY MOUTH ONCE DAILY 90 capsule 0   hydroxychloroquine (PLAQUENIL) 200 MG tablet Take 2 tablets (400 mg total) by mouth daily. 90 tablet 3   hydroxyurea (HYDREA) 500 MG capsule TAKE 1 CAPSULE BY MOUTH EVERY OTHER DAY. ALTERNATING TAKE 2 CAPSULES AS DIRECTED. 135 capsule 1   ibuprofen (ADVIL) 200 MG tablet Take 1 tablet by mouth daily as needed.     metoprolol tartrate (LOPRESSOR) 50 MG tablet TAKE 1 AND 1/2 TABLETS BY MOUTH TWICE A DAY 270 tablet 2   Multiple Vitamins-Minerals (CENTRUM SILVER) CHEW Chew 1 each by mouth daily.     sertraline (ZOLOFT) 50 MG tablet Take 100 mg by mouth daily.     TURMERIC PO Take by mouth.     TYRVAYA 0.03 MG/ACT SOLN Place 1 spray into both nostrils 2 (two) times daily. Per opthamology     No current facility-administered medications for this visit.    SURGICAL HISTORY:  Past Surgical History:  Procedure Laterality Date   ABDOMINAL HYSTERECTOMY     BREAST EXCISIONAL BIOPSY Right    benign   knee replacements      REVIEW OF SYSTEMS:  A comprehensive review of systems was negative.   PHYSICAL EXAMINATION: General appearance: alert, cooperative, and no distress Head: Normocephalic, without obvious abnormality, atraumatic Neck: no adenopathy, no JVD, supple, symmetrical, trachea midline, and thyroid not enlarged, symmetric, no tenderness/mass/nodules Lymph nodes: Cervical, supraclavicular, and axillary nodes normal. Resp: clear to auscultation bilaterally Back: symmetric, no curvature. ROM normal. No CVA tenderness.  Cardio: regular rate and rhythm, S1, S2 normal, no murmur, click, rub or gallop GI: soft, non-tender; bowel sounds normal; no masses,  no organomegaly Extremities: extremities normal, atraumatic, no cyanosis or edema  ECOG PERFORMANCE STATUS: 0 - Asymptomatic  Blood pressure 110/68, pulse 66, temperature 97.9 F (36.6 C), temperature source Oral, resp. rate 17, height 5\' 7"  (1.702 m), weight  187 lb 6.4 oz (85 kg), SpO2 97%.  LABORATORY DATA: Lab Results  Component Value Date   WBC 4.2 07/26/2023   HGB 14.2 07/26/2023   HCT 43.5 07/26/2023   MCV 93.1 07/26/2023   PLT 359 07/26/2023      Chemistry      Component Value Date/Time   NA 143 07/26/2023 0907   NA 142 04/20/2017 1033   K 4.3 07/26/2023 0907   CL 108 07/26/2023 0907   CO2 30 07/26/2023 0907   BUN 18 07/26/2023 0907   BUN 16 04/20/2017 1033   CREATININE 0.92 07/26/2023 0907      Component Value Date/Time   CALCIUM 9.3 07/26/2023 0907   ALKPHOS 74 07/26/2023 0907   AST 24 07/26/2023 0907   ALT 16 07/26/2023 0907   BILITOT 0.8 07/26/2023 0907       RADIOGRAPHIC STUDIES: No results found.  ASSESSMENT AND PLAN: This is a very pleasant 72 years old white female recently diagnosed with essential thrombocythemia as well as iron deficiency anemia.  The patient is currently on treatment with hydroxyurea 1000 mg p.o. alternating with 500 mg every other day in addition to Integra +1 capsule p.o. daily.    Essential Thrombocythemia Managed with hydroxyurea (500 mg alternating with 1000 mg every other day). No complaints of chest pain, shortness of breath, nausea, vomiting, or diarrhea. Recent CBC: platelet count 359,000, hemoglobin 14.2, hematocrit 43.5, WBC 4.2. Chemistry panel: blood sugar 102, LDH 221 (lower than previous levels). - Continue current hydroxyurea regimen - Follow-up in three months  Iron Deficiency Anemia Well-managed. Hemoglobin 14.2, hematocrit 43.5, indicating no current anemia. - Monitor hemoglobin and hematocrit levels at next follow-up  General Health Maintenance Slightly elevated blood sugar and LDH noted but not of immediate concern. - Monitor blood sugar and LDH levels at next follow-up.   The patient was advised to call immediately if she has any other concerning symptoms in the interval.   The patient voices understanding of current disease status and treatment options and is  in agreement with the current care plan.  All questions were answered. The patient knows to call the clinic with any problems, questions or concerns. We can certainly see the patient much sooner if necessary.  Disclaimer: This note was dictated with voice recognition software. Similar sounding words can inadvertently be transcribed and may not be corrected upon review.

## 2023-08-02 DIAGNOSIS — M542 Cervicalgia: Secondary | ICD-10-CM | POA: Diagnosis not present

## 2023-08-02 DIAGNOSIS — M25519 Pain in unspecified shoulder: Secondary | ICD-10-CM | POA: Diagnosis not present

## 2023-08-09 DIAGNOSIS — M542 Cervicalgia: Secondary | ICD-10-CM | POA: Diagnosis not present

## 2023-08-09 DIAGNOSIS — M25519 Pain in unspecified shoulder: Secondary | ICD-10-CM | POA: Diagnosis not present

## 2023-08-16 DIAGNOSIS — M542 Cervicalgia: Secondary | ICD-10-CM | POA: Diagnosis not present

## 2023-08-16 DIAGNOSIS — M25519 Pain in unspecified shoulder: Secondary | ICD-10-CM | POA: Diagnosis not present

## 2023-08-23 DIAGNOSIS — M25519 Pain in unspecified shoulder: Secondary | ICD-10-CM | POA: Diagnosis not present

## 2023-08-23 DIAGNOSIS — M542 Cervicalgia: Secondary | ICD-10-CM | POA: Diagnosis not present

## 2023-08-30 DIAGNOSIS — M542 Cervicalgia: Secondary | ICD-10-CM | POA: Diagnosis not present

## 2023-08-30 DIAGNOSIS — M25519 Pain in unspecified shoulder: Secondary | ICD-10-CM | POA: Diagnosis not present

## 2023-09-13 DIAGNOSIS — M542 Cervicalgia: Secondary | ICD-10-CM | POA: Diagnosis not present

## 2023-09-13 DIAGNOSIS — M545 Low back pain, unspecified: Secondary | ICD-10-CM | POA: Diagnosis not present

## 2023-09-13 DIAGNOSIS — M25519 Pain in unspecified shoulder: Secondary | ICD-10-CM | POA: Diagnosis not present

## 2023-09-13 DIAGNOSIS — M25569 Pain in unspecified knee: Secondary | ICD-10-CM | POA: Diagnosis not present

## 2023-09-22 DIAGNOSIS — N1831 Chronic kidney disease, stage 3a: Secondary | ICD-10-CM | POA: Diagnosis not present

## 2023-09-24 ENCOUNTER — Other Ambulatory Visit: Payer: Self-pay | Admitting: Family Medicine

## 2023-09-24 DIAGNOSIS — Z711 Person with feared health complaint in whom no diagnosis is made: Secondary | ICD-10-CM

## 2023-10-08 ENCOUNTER — Encounter: Payer: Self-pay | Admitting: Family Medicine

## 2023-10-15 ENCOUNTER — Ambulatory Visit
Admission: RE | Admit: 2023-10-15 | Discharge: 2023-10-15 | Disposition: A | Discharge status: Home / Self Care | Payer: Medicare HMO | Source: Ambulatory Visit | Attending: Family Medicine | Admitting: Family Medicine

## 2023-10-15 DIAGNOSIS — Z711 Person with feared health complaint in whom no diagnosis is made: Secondary | ICD-10-CM

## 2023-10-15 DIAGNOSIS — I6782 Cerebral ischemia: Secondary | ICD-10-CM | POA: Diagnosis not present

## 2023-10-15 DIAGNOSIS — R413 Other amnesia: Secondary | ICD-10-CM | POA: Diagnosis not present

## 2023-10-15 DIAGNOSIS — G319 Degenerative disease of nervous system, unspecified: Secondary | ICD-10-CM | POA: Diagnosis not present

## 2023-10-15 DIAGNOSIS — R251 Tremor, unspecified: Secondary | ICD-10-CM | POA: Diagnosis not present

## 2023-10-18 DIAGNOSIS — Z86018 Personal history of other benign neoplasm: Secondary | ICD-10-CM | POA: Diagnosis not present

## 2023-10-18 DIAGNOSIS — L578 Other skin changes due to chronic exposure to nonionizing radiation: Secondary | ICD-10-CM | POA: Diagnosis not present

## 2023-10-18 DIAGNOSIS — L821 Other seborrheic keratosis: Secondary | ICD-10-CM | POA: Diagnosis not present

## 2023-10-18 DIAGNOSIS — Z85828 Personal history of other malignant neoplasm of skin: Secondary | ICD-10-CM | POA: Diagnosis not present

## 2023-10-18 DIAGNOSIS — D229 Melanocytic nevi, unspecified: Secondary | ICD-10-CM | POA: Diagnosis not present

## 2023-10-18 DIAGNOSIS — L814 Other melanin hyperpigmentation: Secondary | ICD-10-CM | POA: Diagnosis not present

## 2023-10-25 ENCOUNTER — Inpatient Hospital Stay: Payer: Medicare HMO | Attending: Internal Medicine

## 2023-10-25 ENCOUNTER — Inpatient Hospital Stay (HOSPITAL_BASED_OUTPATIENT_CLINIC_OR_DEPARTMENT_OTHER): Payer: Medicare HMO | Admitting: Internal Medicine

## 2023-10-25 VITALS — BP 114/71 | HR 66 | Temp 98.1°F | Resp 17 | Wt 193.1 lb

## 2023-10-25 DIAGNOSIS — D509 Iron deficiency anemia, unspecified: Secondary | ICD-10-CM | POA: Insufficient documentation

## 2023-10-25 DIAGNOSIS — D473 Essential (hemorrhagic) thrombocythemia: Secondary | ICD-10-CM | POA: Insufficient documentation

## 2023-10-25 DIAGNOSIS — Z79899 Other long term (current) drug therapy: Secondary | ICD-10-CM | POA: Diagnosis not present

## 2023-10-25 LAB — CMP (CANCER CENTER ONLY)
ALT: 16 U/L (ref 0–44)
AST: 23 U/L (ref 15–41)
Albumin: 4.4 g/dL (ref 3.5–5.0)
Alkaline Phosphatase: 76 U/L (ref 38–126)
Anion gap: 5 (ref 5–15)
BUN: 17 mg/dL (ref 8–23)
CO2: 29 mmol/L (ref 22–32)
Calcium: 9.4 mg/dL (ref 8.9–10.3)
Chloride: 108 mmol/L (ref 98–111)
Creatinine: 0.87 mg/dL (ref 0.44–1.00)
GFR, Estimated: >60 mL/min (ref >60)
Glucose, Bld: 106 mg/dL — ABNORMAL HIGH (ref 70–99)
Potassium: 5.1 mmol/L (ref 3.5–5.1)
Sodium: 142 mmol/L (ref 135–145)
Total Bilirubin: 0.7 mg/dL (ref 0.0–1.2)
Total Protein: 6.9 g/dL (ref 6.5–8.1)

## 2023-10-25 LAB — CBC WITH DIFFERENTIAL (CANCER CENTER ONLY)
Abs Immature Granulocytes: 0.01 10*3/uL (ref 0.00–0.07)
Basophils Absolute: 0.1 10*3/uL (ref 0.0–0.1)
Basophils Relative: 2 %
Eosinophils Absolute: 0.2 10*3/uL (ref 0.0–0.5)
Eosinophils Relative: 4 %
HCT: 43.7 % (ref 36.0–46.0)
Hemoglobin: 13.8 g/dL (ref 12.0–15.0)
Immature Granulocytes: 0 %
Lymphocytes Relative: 18 %
Lymphs Abs: 0.8 10*3/uL (ref 0.7–4.0)
MCH: 29.7 pg (ref 26.0–34.0)
MCHC: 31.6 g/dL (ref 30.0–36.0)
MCV: 94.2 fL (ref 80.0–100.0)
Monocytes Absolute: 0.5 10*3/uL (ref 0.1–1.0)
Monocytes Relative: 10 %
Neutro Abs: 3.1 10*3/uL (ref 1.7–7.7)
Neutrophils Relative %: 66 %
Platelet Count: 405 10*3/uL — ABNORMAL HIGH (ref 150–400)
RBC: 4.64 MIL/uL (ref 3.87–5.11)
RDW: 14.9 % (ref 11.5–15.5)
WBC Count: 4.7 10*3/uL (ref 4.0–10.5)
nRBC: 0 % (ref 0.0–0.2)

## 2023-10-25 LAB — LACTATE DEHYDROGENASE: LDH: 234 U/L — ABNORMAL HIGH (ref 98–192)

## 2023-10-25 NOTE — Progress Notes (Signed)
 [facility] [phone]   Fax:(336) [phone]  OFFICE PROGRESS NOTE  Mordechai April, DO [address] [place] [address]  DIAGNOSIS: Essential Thrombocythemia. [name] 2 mutation positive.    PRIOR THERAPY: None   CURRENT THERAPY: 1) Hydroxyurea  1000 mg alternating with 500 mg every other day . First dose on 07/03/20 started as 500 mg p.o. daily. 2) Integra plus  1 tablet p.o. daily  INTERVAL HISTORY: Ruth Sharp 73 y.o. female returns to the clinic today for follow-up visit.Discussed the use of AI scribe software for clinical note transcription with the patient, who gave verbal consent to proceed.  History of Present Illness   Ruth Sharp is a 73 year old female with essential thrombocythemia who presents for follow-up.  She has essential thrombocythemia with a positive [name]2 mutation and is currently on hydroxyurea , alternating between 1000 mg and 500 mg every other day. Her platelet counts have fluctuated, with recent counts at 405,000, previously 417,000 in August, and 359,000 in November.  She has a history of anemia and was previously on Integra Plus  for iron supplementation. She ran out of the prescription and did not renew it but has obtained some over-the-counter iron supplements. Her iron levels are reportedly still good.  She underwent an MRI of the brain due to concerns about cognitive function, as she feels her 'brain doesn't seem to be working as well.' This was ordered by her general practitioner. She also underwent a two-hour cognitive test, which she reports was fine.       MEDICAL HISTORY: Past Medical History:  Diagnosis Date   Arthritis    Tachycardia     ALLERGIES:  is allergic to keflex [cephalexin] and tape.  MEDICATIONS:  Current Outpatient Medications  Medication Sig Dispense Refill   atorvastatin  (LIPITOR) 20 MG tablet TAKE ONE TABLET BY MOUTH ONCE DAILY 90 tablet 3   Bacillus Coagulans-Inulin (PROBIOTIC)  1-250 BILLION-MG CAPS Take 1 capsule by mouth daily. Pt states uses Wilmer Hash brand     buPROPion (WELLBUTRIN) 75 MG tablet Take 75 mg by mouth 2 (two) times daily.     Calcium  Carbonate-Vitamin D (CALTRATE 600+D PO) Take 1 tablet by mouth daily.     FeFum-FePoly-FA-B Cmp-C-Biot (FOLIVANE-PLUS) CAPS TAKE ONE CAPSULE BY MOUTH ONCE DAILY 90 capsule 0   hydroxychloroquine  (PLAQUENIL ) 200 MG tablet Take 2 tablets (400 mg total) by mouth daily. 90 tablet 3   hydroxyurea  (HYDREA ) 500 MG capsule TAKE 1 CAPSULE BY MOUTH EVERY OTHER DAY. ALTERNATING TAKE 2 CAPSULES AS DIRECTED. 135 capsule 1   ibuprofen (ADVIL) 200 MG tablet Take 1 tablet by mouth daily as needed.     metoprolol  tartrate (LOPRESSOR ) 50 MG tablet TAKE 1 AND 1/2 TABLETS BY MOUTH TWICE A DAY 270 tablet 2   Multiple Vitamins-Minerals (CENTRUM SILVER) CHEW Chew 1 each by mouth daily.     sertraline (ZOLOFT) 50 MG tablet Take 100 mg by mouth daily.     TURMERIC PO Take by mouth.     TYRVAYA 0.03 MG/ACT SOLN Place 1 spray into both nostrils 2 (two) times daily. Per opthamology     No current facility-administered medications for this visit.    SURGICAL HISTORY:  Past Surgical History:  Procedure Laterality Date   ABDOMINAL HYSTERECTOMY     BREAST EXCISIONAL BIOPSY Right    benign   knee replacements      REVIEW OF SYSTEMS:  A comprehensive review of systems was negative.   PHYSICAL  EXAMINATION: General appearance: alert, cooperative, and no distress Head: Normocephalic, without obvious abnormality, atraumatic Neck: no adenopathy, no JVD, supple, symmetrical, trachea midline, and thyroid  not enlarged, symmetric, no tenderness/mass/nodules Lymph nodes: Cervical, supraclavicular, and axillary nodes normal. Resp: clear to auscultation bilaterally Back: symmetric, no curvature. ROM normal. No CVA tenderness. Cardio: regular rate and rhythm, S1, S2 normal, no murmur, click, rub or gallop GI: soft, non-tender; bowel sounds normal; no  masses,  no organomegaly Extremities: extremities normal, atraumatic, no cyanosis or edema  ECOG PERFORMANCE STATUS: 0 - Asymptomatic  Blood pressure 114/71, pulse 66, temperature 98.1 F (36.7 C), temperature source Temporal, resp. rate 17, weight 193 lb 1.6 oz (87.6 kg), SpO2 97%.  LABORATORY DATA: Lab Results  Component Value Date   WBC 4.7 10/25/2023   HGB 13.8 10/25/2023   HCT 43.7 10/25/2023   MCV 94.2 10/25/2023   PLT 405 (H) 10/25/2023      Chemistry      Component Value Date/Time   NA 142 10/25/2023 0959   NA 142 04/20/2017 1033   K 5.1 10/25/2023 0959   CL 108 10/25/2023 0959   CO2 29 10/25/2023 0959   BUN 17 10/25/2023 0959   BUN 16 04/20/2017 1033   CREATININE 0.87 10/25/2023 0959      Component Value Date/Time   CALCIUM  9.4 10/25/2023 0959   ALKPHOS 76 10/25/2023 0959   AST 23 10/25/2023 0959   ALT 16 10/25/2023 0959   BILITOT 0.7 10/25/2023 0959       RADIOGRAPHIC STUDIES: MR BRAIN WO CONTRAST Result Date: 10/18/2023 CLINICAL DATA:  Provided history: Concern about memory. Additional history provided by scanning technologist: Memory loss, confusion, tremors, history of uterine cancer. EXAM: MRI HEAD WITHOUT CONTRAST TECHNIQUE: Multiplanar, multiecho pulse sequences of the brain and surrounding structures were obtained without intravenous contrast. COMPARISON:  None. FINDINGS: Brain: Mild generalized cerebral volume loss. Multifocal T2 FLAIR hyperintense signal abnormality within the cerebral white matter, nonspecific but compatible with mild chronic small vessel ischemic disease. Prominent perivascular spaces throughout the bilateral cerebral white matter. There is no acute infarct. No evidence of an intracranial mass. No chronic intracranial blood products. No extra-axial fluid collection. No midline shift. Vascular: Maintained flow voids within the proximal large arterial vessels. Skull and upper cervical spine: No focal worrisome marrow lesion. Sinuses/Orbits:  No mass or acute finding within the imaged orbits. Prior bilateral ocular lens replacement. No significant paranasal sinus disease. IMPRESSION: 1. No evidence of an acute intracranial abnormality. 2. Mild chronic small vessel ischemic changes within the cerebral white matter. 3. Mild generalized cerebral volume loss. Electronically Signed   By: Bascom Lily D.O.   On: 10/18/2023 18:53    ASSESSMENT AND PLAN: This is a very pleasant 73 years old white female recently diagnosed with essential thrombocythemia as well as iron deficiency anemia.  The patient is currently on treatment with hydroxyurea  1000 mg p.o. alternating with 500 mg every other day in addition to Integra +1 capsule p.o. daily.    Essential Thrombocythemia with [name]2 Mutation Currently on hydroxyurea , alternating 1000 mg with 500 mg every other day. Platelet count is 405,000, previously 417,000 in August and 359,000 in November. No significant changes warranting alteration in treatment. Blood glucose level is 106, other chemistry results are within normal limits. - Continue hydroxyurea  regimen (1000 mg alternating with 500 mg every other day) - Follow-up blood work in three months  Anemia Previously managed with Integra Plus . Currently taking over-the-counter iron supplements with adequate iron levels. - Take  over-the-counter iron every other day - Take iron with orange juice or vitamin D to enhance absorption  General Health Maintenance Blood glucose level is 106, other chemistry results are within normal limits. - Monitor blood glucose levels  Follow-up - Schedule follow-up appointment in three months with blood work.   The patient was advised to call immediately if she has any concerning symptoms in the interval.  The patient voices understanding of current disease status and treatment options and is in agreement with the current care plan.  All questions were answered. The patient knows to call the clinic with any problems,  questions or concerns. We can certainly see the patient much sooner if necessary.  Disclaimer: This note was dictated with voice recognition software. Similar sounding words can inadvertently be transcribed and may not be corrected upon review.

## 2023-11-09 DIAGNOSIS — Z6831 Body mass index (BMI) 31.0-31.9, adult: Secondary | ICD-10-CM | POA: Diagnosis not present

## 2023-11-09 DIAGNOSIS — R5383 Other fatigue: Secondary | ICD-10-CM | POA: Diagnosis not present

## 2023-11-09 DIAGNOSIS — E669 Obesity, unspecified: Secondary | ICD-10-CM | POA: Diagnosis not present

## 2023-11-09 DIAGNOSIS — D473 Essential (hemorrhagic) thrombocythemia: Secondary | ICD-10-CM | POA: Diagnosis not present

## 2023-11-09 DIAGNOSIS — M25471 Effusion, right ankle: Secondary | ICD-10-CM | POA: Diagnosis not present

## 2023-11-09 DIAGNOSIS — M0579 Rheumatoid arthritis with rheumatoid factor of multiple sites without organ or systems involvement: Secondary | ICD-10-CM | POA: Diagnosis not present

## 2023-11-09 DIAGNOSIS — M1991 Primary osteoarthritis, unspecified site: Secondary | ICD-10-CM | POA: Diagnosis not present

## 2023-11-09 DIAGNOSIS — M8589 Other specified disorders of bone density and structure, multiple sites: Secondary | ICD-10-CM | POA: Diagnosis not present

## 2023-11-12 DIAGNOSIS — F411 Generalized anxiety disorder: Secondary | ICD-10-CM | POA: Diagnosis not present

## 2023-11-12 DIAGNOSIS — F330 Major depressive disorder, recurrent, mild: Secondary | ICD-10-CM | POA: Diagnosis not present

## 2023-11-16 DIAGNOSIS — M25569 Pain in unspecified knee: Secondary | ICD-10-CM | POA: Diagnosis not present

## 2023-11-16 DIAGNOSIS — M545 Low back pain, unspecified: Secondary | ICD-10-CM | POA: Diagnosis not present

## 2023-11-16 DIAGNOSIS — M542 Cervicalgia: Secondary | ICD-10-CM | POA: Diagnosis not present

## 2023-11-16 DIAGNOSIS — M25519 Pain in unspecified shoulder: Secondary | ICD-10-CM | POA: Diagnosis not present

## 2023-11-22 DIAGNOSIS — M542 Cervicalgia: Secondary | ICD-10-CM | POA: Diagnosis not present

## 2023-11-22 DIAGNOSIS — M545 Low back pain, unspecified: Secondary | ICD-10-CM | POA: Diagnosis not present

## 2023-11-22 DIAGNOSIS — M25569 Pain in unspecified knee: Secondary | ICD-10-CM | POA: Diagnosis not present

## 2023-11-22 DIAGNOSIS — M25519 Pain in unspecified shoulder: Secondary | ICD-10-CM | POA: Diagnosis not present

## 2023-12-06 DIAGNOSIS — Z0000 Encounter for general adult medical examination without abnormal findings: Secondary | ICD-10-CM | POA: Diagnosis not present

## 2023-12-06 DIAGNOSIS — M25569 Pain in unspecified knee: Secondary | ICD-10-CM | POA: Diagnosis not present

## 2023-12-06 DIAGNOSIS — Z6830 Body mass index (BMI) 30.0-30.9, adult: Secondary | ICD-10-CM | POA: Diagnosis not present

## 2023-12-06 DIAGNOSIS — M25519 Pain in unspecified shoulder: Secondary | ICD-10-CM | POA: Diagnosis not present

## 2023-12-06 DIAGNOSIS — Z1159 Encounter for screening for other viral diseases: Secondary | ICD-10-CM | POA: Diagnosis not present

## 2023-12-06 DIAGNOSIS — M545 Low back pain, unspecified: Secondary | ICD-10-CM | POA: Diagnosis not present

## 2023-12-06 DIAGNOSIS — M542 Cervicalgia: Secondary | ICD-10-CM | POA: Diagnosis not present

## 2023-12-13 DIAGNOSIS — M542 Cervicalgia: Secondary | ICD-10-CM | POA: Diagnosis not present

## 2023-12-13 DIAGNOSIS — M545 Low back pain, unspecified: Secondary | ICD-10-CM | POA: Diagnosis not present

## 2023-12-13 DIAGNOSIS — M25569 Pain in unspecified knee: Secondary | ICD-10-CM | POA: Diagnosis not present

## 2023-12-13 DIAGNOSIS — M25519 Pain in unspecified shoulder: Secondary | ICD-10-CM | POA: Diagnosis not present

## 2023-12-27 DIAGNOSIS — M25579 Pain in unspecified ankle and joints of unspecified foot: Secondary | ICD-10-CM | POA: Diagnosis not present

## 2023-12-27 DIAGNOSIS — M25519 Pain in unspecified shoulder: Secondary | ICD-10-CM | POA: Diagnosis not present

## 2023-12-27 DIAGNOSIS — M25569 Pain in unspecified knee: Secondary | ICD-10-CM | POA: Diagnosis not present

## 2023-12-27 DIAGNOSIS — M542 Cervicalgia: Secondary | ICD-10-CM | POA: Diagnosis not present

## 2023-12-27 DIAGNOSIS — M545 Low back pain, unspecified: Secondary | ICD-10-CM | POA: Diagnosis not present

## 2024-01-03 DIAGNOSIS — M545 Low back pain, unspecified: Secondary | ICD-10-CM | POA: Diagnosis not present

## 2024-01-03 DIAGNOSIS — M25569 Pain in unspecified knee: Secondary | ICD-10-CM | POA: Diagnosis not present

## 2024-01-03 DIAGNOSIS — M25519 Pain in unspecified shoulder: Secondary | ICD-10-CM | POA: Diagnosis not present

## 2024-01-03 DIAGNOSIS — M542 Cervicalgia: Secondary | ICD-10-CM | POA: Diagnosis not present

## 2024-01-03 DIAGNOSIS — M25579 Pain in unspecified ankle and joints of unspecified foot: Secondary | ICD-10-CM | POA: Diagnosis not present

## 2024-01-10 DIAGNOSIS — M25569 Pain in unspecified knee: Secondary | ICD-10-CM | POA: Diagnosis not present

## 2024-01-10 DIAGNOSIS — M25519 Pain in unspecified shoulder: Secondary | ICD-10-CM | POA: Diagnosis not present

## 2024-01-10 DIAGNOSIS — M545 Low back pain, unspecified: Secondary | ICD-10-CM | POA: Diagnosis not present

## 2024-01-10 DIAGNOSIS — M25579 Pain in unspecified ankle and joints of unspecified foot: Secondary | ICD-10-CM | POA: Diagnosis not present

## 2024-01-10 DIAGNOSIS — M542 Cervicalgia: Secondary | ICD-10-CM | POA: Diagnosis not present

## 2024-01-17 ENCOUNTER — Inpatient Hospital Stay: Payer: Medicare HMO | Attending: Internal Medicine

## 2024-01-17 ENCOUNTER — Inpatient Hospital Stay: Payer: Medicare HMO | Admitting: Internal Medicine

## 2024-01-17 VITALS — BP 120/81 | HR 61 | Temp 97.4°F | Resp 16 | Ht 67.0 in | Wt 192.4 lb

## 2024-01-17 DIAGNOSIS — D473 Essential (hemorrhagic) thrombocythemia: Secondary | ICD-10-CM | POA: Insufficient documentation

## 2024-01-17 DIAGNOSIS — Z7982 Long term (current) use of aspirin: Secondary | ICD-10-CM | POA: Diagnosis not present

## 2024-01-17 DIAGNOSIS — Z79899 Other long term (current) drug therapy: Secondary | ICD-10-CM | POA: Diagnosis not present

## 2024-01-17 DIAGNOSIS — M25519 Pain in unspecified shoulder: Secondary | ICD-10-CM | POA: Diagnosis not present

## 2024-01-17 DIAGNOSIS — M25579 Pain in unspecified ankle and joints of unspecified foot: Secondary | ICD-10-CM | POA: Diagnosis not present

## 2024-01-17 DIAGNOSIS — D509 Iron deficiency anemia, unspecified: Secondary | ICD-10-CM | POA: Insufficient documentation

## 2024-01-17 DIAGNOSIS — M542 Cervicalgia: Secondary | ICD-10-CM | POA: Diagnosis not present

## 2024-01-17 DIAGNOSIS — M545 Low back pain, unspecified: Secondary | ICD-10-CM | POA: Diagnosis not present

## 2024-01-17 DIAGNOSIS — M25569 Pain in unspecified knee: Secondary | ICD-10-CM | POA: Diagnosis not present

## 2024-01-17 LAB — CBC WITH DIFFERENTIAL (CANCER CENTER ONLY)
Abs Immature Granulocytes: 0.01 10*3/uL (ref 0.00–0.07)
Basophils Absolute: 0.1 10*3/uL (ref 0.0–0.1)
Basophils Relative: 1 %
Eosinophils Absolute: 0.1 10*3/uL (ref 0.0–0.5)
Eosinophils Relative: 3 %
HCT: 41.5 % (ref 36.0–46.0)
Hemoglobin: 13.7 g/dL (ref 12.0–15.0)
Immature Granulocytes: 0 %
Lymphocytes Relative: 17 %
Lymphs Abs: 0.8 10*3/uL (ref 0.7–4.0)
MCH: 30 pg (ref 26.0–34.0)
MCHC: 33 g/dL (ref 30.0–36.0)
MCV: 91 fL (ref 80.0–100.0)
Monocytes Absolute: 0.4 10*3/uL (ref 0.1–1.0)
Monocytes Relative: 8 %
Neutro Abs: 3.2 10*3/uL (ref 1.7–7.7)
Neutrophils Relative %: 71 %
Platelet Count: 400 10*3/uL (ref 150–400)
RBC: 4.56 MIL/uL (ref 3.87–5.11)
RDW: 14.6 % (ref 11.5–15.5)
WBC Count: 4.5 10*3/uL (ref 4.0–10.5)
nRBC: 0 % (ref 0.0–0.2)

## 2024-01-17 LAB — CMP (CANCER CENTER ONLY)
ALT: 17 U/L (ref 0–44)
AST: 24 U/L (ref 15–41)
Albumin: 4.4 g/dL (ref 3.5–5.0)
Alkaline Phosphatase: 71 U/L (ref 38–126)
Anion gap: 6 (ref 5–15)
BUN: 17 mg/dL (ref 8–23)
CO2: 27 mmol/L (ref 22–32)
Calcium: 8.6 mg/dL — ABNORMAL LOW (ref 8.9–10.3)
Chloride: 109 mmol/L (ref 98–111)
Creatinine: 0.84 mg/dL (ref 0.44–1.00)
GFR, Estimated: >60 mL/min (ref >60)
Glucose, Bld: 101 mg/dL — ABNORMAL HIGH (ref 70–99)
Potassium: 4.2 mmol/L (ref 3.5–5.1)
Sodium: 142 mmol/L (ref 135–145)
Total Bilirubin: 0.7 mg/dL (ref 0.0–1.2)
Total Protein: 6.9 g/dL (ref 6.5–8.1)

## 2024-01-17 LAB — LACTATE DEHYDROGENASE: LDH: 240 U/L — ABNORMAL HIGH (ref 98–192)

## 2024-01-17 NOTE — Progress Notes (Signed)
 [facility] [phone]   Fax:([phone]  OFFICE PROGRESS NOTE  Mordechai April, DO [address] [place] [address]  DIAGNOSIS: Essential Thrombocythemia. [name] 2 mutation positive.    PRIOR THERAPY: None   CURRENT THERAPY: 1) Hydroxyurea  1000 mg alternating with 500 mg every other day . First dose on 07/03/20 started as 500 mg p.o. daily. 2) Integra plus  1 tablet p.o. daily  INTERVAL HISTORY: Ruth Sharp 73 y.o. female returns to the clinic today for follow-up visit.Discussed the use of AI scribe software for clinical note transcription with the patient, who gave verbal consent to proceed.  History of Present Illness   Ruth Sharp is a 73 year old female with essential thrombocythemia who presents for evaluation and repeat blood work.  She has a history of essential thrombocythemia with a [name]2 positive mutation, currently managed with hydroxyurea . Her regimen includes 1000 mg orally every other day, alternating with 500 mg on the other days. She reports no new symptoms since her last visit approximately three months ago.  No chest pain, shortness of breath, bleeding, or bruising. Her appetite remains good, and she has not experienced any weight loss. Her blood pressure is stable.  She is also taking Integraplus for a history of anemia and a baby aspirin daily.        MEDICAL HISTORY: Past Medical History:  Diagnosis Date   Arthritis    Tachycardia     ALLERGIES:  is allergic to keflex [cephalexin] and tape.  MEDICATIONS:  Current Outpatient Medications  Medication Sig Dispense Refill   atorvastatin  (LIPITOR) 20 MG tablet TAKE ONE TABLET BY MOUTH ONCE DAILY 90 tablet 3   Bacillus Coagulans-Inulin (PROBIOTIC) 1-250 BILLION-MG CAPS Take 1 capsule by mouth daily. Pt states uses Wilmer Hash brand     buPROPion (WELLBUTRIN) 75 MG tablet Take 75 mg by mouth 2 (two) times daily.     Calcium  Carbonate-Vitamin D (CALTRATE 600+D  PO) Take 1 tablet by mouth daily.     FeFum-FePoly-FA-B Cmp-C-Biot (FOLIVANE-PLUS) CAPS TAKE ONE CAPSULE BY MOUTH ONCE DAILY 90 capsule 0   hydroxychloroquine  (PLAQUENIL ) 200 MG tablet Take 2 tablets (400 mg total) by mouth daily. 90 tablet 3   hydroxyurea  (HYDREA ) 500 MG capsule TAKE 1 CAPSULE BY MOUTH EVERY OTHER DAY. ALTERNATING TAKE 2 CAPSULES AS DIRECTED. 135 capsule 1   ibuprofen (ADVIL) 200 MG tablet Take 1 tablet by mouth daily as needed.     metoprolol  tartrate (LOPRESSOR ) 50 MG tablet TAKE 1 AND 1/2 TABLETS BY MOUTH TWICE A DAY 270 tablet 2   Multiple Vitamins-Minerals (CENTRUM SILVER) CHEW Chew 1 each by mouth daily.     sertraline (ZOLOFT) 50 MG tablet Take 100 mg by mouth daily.     TURMERIC PO Take by mouth.     TYRVAYA 0.03 MG/ACT SOLN Place 1 spray into both nostrils 2 (two) times daily. Per opthamology     No current facility-administered medications for this visit.    SURGICAL HISTORY:  Past Surgical History:  Procedure Laterality Date   ABDOMINAL HYSTERECTOMY     BREAST EXCISIONAL BIOPSY Right    benign   knee replacements      REVIEW OF SYSTEMS:  A comprehensive review of systems was negative.   PHYSICAL EXAMINATION: General appearance: alert, cooperative, and no distress Head: Normocephalic, without obvious abnormality, atraumatic Neck: no adenopathy, no JVD, supple, symmetrical, trachea midline, and thyroid  not enlarged, symmetric, no tenderness/mass/nodules Lymph nodes: Cervical, supraclavicular,  and axillary nodes normal. Resp: clear to auscultation bilaterally Back: symmetric, no curvature. ROM normal. No CVA tenderness. Cardio: regular rate and rhythm, S1, S2 normal, no murmur, click, rub or gallop GI: soft, non-tender; bowel sounds normal; no masses,  no organomegaly Extremities: extremities normal, atraumatic, no cyanosis or edema  ECOG PERFORMANCE STATUS: 0 - Asymptomatic  Blood pressure 120/81, pulse 61, temperature (!) 97.4 F (36.3 C), temperature  source Temporal, resp. rate 16, height 5\' 7"  (1.702 m), weight 192 lb 6.4 oz (87.3 kg), SpO2 98%.  LABORATORY DATA: Lab Results  Component Value Date   WBC 4.5 01/17/2024   HGB 13.7 01/17/2024   HCT 41.5 01/17/2024   MCV 91.0 01/17/2024   PLT 400 01/17/2024      Chemistry      Component Value Date/Time   NA 142 01/17/2024 1025   NA 142 04/20/2017 1033   K 4.2 01/17/2024 1025   CL 109 01/17/2024 1025   CO2 27 01/17/2024 1025   BUN 17 01/17/2024 1025   BUN 16 04/20/2017 1033   CREATININE 0.84 01/17/2024 1025      Component Value Date/Time   CALCIUM  8.6 (L) 01/17/2024 1025   ALKPHOS 71 01/17/2024 1025   AST 24 01/17/2024 1025   ALT 17 01/17/2024 1025   BILITOT 0.7 01/17/2024 1025       RADIOGRAPHIC STUDIES: No results found.   ASSESSMENT AND PLAN: This is a very pleasant 73 years old white female recently diagnosed with essential thrombocythemia as well as iron deficiency anemia.  The patient is currently on treatment with hydroxyurea  1000 mg p.o. alternating with 500 mg every other day in addition to Integra +1 capsule p.o. daily. The patient has been tolerating this treatment fairly well.    Essential thrombocythemia with [name]2 mutation Essential thrombocythemia with [name]2 positive mutation, well-managed. Blood counts are at the upper limit of normal (400). No chest pain, dyspnea, bleeding, or bruising. Appetite is good, with no weight loss. Blood pressure is stable. Current hydroxyurea  treatment is effective. - Continue hydroxyurea  1000 mg PO on one day and 500 mg PO on the alternate day. - Continue baby aspirin. - Schedule follow-up in three months with repeat blood work.  Anemia Anemia, currently well-managed with Integraplus. No symptoms indicating anemia. CBC is normal. - Continue Integraplus.   She was advised to call immediately if she has any concerning symptoms in the interval.  The patient voices understanding of current disease status and treatment options  and is in agreement with the current care plan.  All questions were answered. The patient knows to call the clinic with any problems, questions or concerns. We can certainly see the patient much sooner if necessary.  Disclaimer: This note was dictated with voice recognition software. Similar sounding words can inadvertently be transcribed and may not be corrected upon review.

## 2024-01-24 DIAGNOSIS — M25519 Pain in unspecified shoulder: Secondary | ICD-10-CM | POA: Diagnosis not present

## 2024-01-24 DIAGNOSIS — M542 Cervicalgia: Secondary | ICD-10-CM | POA: Diagnosis not present

## 2024-01-24 DIAGNOSIS — M25569 Pain in unspecified knee: Secondary | ICD-10-CM | POA: Diagnosis not present

## 2024-01-24 DIAGNOSIS — M25579 Pain in unspecified ankle and joints of unspecified foot: Secondary | ICD-10-CM | POA: Diagnosis not present

## 2024-01-24 DIAGNOSIS — M545 Low back pain, unspecified: Secondary | ICD-10-CM | POA: Diagnosis not present

## 2024-01-31 DIAGNOSIS — M25519 Pain in unspecified shoulder: Secondary | ICD-10-CM | POA: Diagnosis not present

## 2024-01-31 DIAGNOSIS — M545 Low back pain, unspecified: Secondary | ICD-10-CM | POA: Diagnosis not present

## 2024-01-31 DIAGNOSIS — M25579 Pain in unspecified ankle and joints of unspecified foot: Secondary | ICD-10-CM | POA: Diagnosis not present

## 2024-01-31 DIAGNOSIS — M25569 Pain in unspecified knee: Secondary | ICD-10-CM | POA: Diagnosis not present

## 2024-01-31 DIAGNOSIS — M542 Cervicalgia: Secondary | ICD-10-CM | POA: Diagnosis not present

## 2024-02-07 DIAGNOSIS — M545 Low back pain, unspecified: Secondary | ICD-10-CM | POA: Diagnosis not present

## 2024-02-07 DIAGNOSIS — M542 Cervicalgia: Secondary | ICD-10-CM | POA: Diagnosis not present

## 2024-02-07 DIAGNOSIS — M25519 Pain in unspecified shoulder: Secondary | ICD-10-CM | POA: Diagnosis not present

## 2024-02-07 DIAGNOSIS — M25569 Pain in unspecified knee: Secondary | ICD-10-CM | POA: Diagnosis not present

## 2024-02-07 DIAGNOSIS — M25579 Pain in unspecified ankle and joints of unspecified foot: Secondary | ICD-10-CM | POA: Diagnosis not present

## 2024-02-08 ENCOUNTER — Other Ambulatory Visit: Payer: Self-pay

## 2024-02-08 MED ORDER — ATORVASTATIN CALCIUM 20 MG PO TABS
20.0000 mg | ORAL_TABLET | Freq: Every day | ORAL | 0 refills | Status: DC
Start: 2024-02-08 — End: 2024-03-20

## 2024-02-12 ENCOUNTER — Other Ambulatory Visit: Payer: Self-pay | Admitting: Internal Medicine

## 2024-02-12 DIAGNOSIS — D473 Essential (hemorrhagic) thrombocythemia: Secondary | ICD-10-CM

## 2024-02-14 DIAGNOSIS — M25519 Pain in unspecified shoulder: Secondary | ICD-10-CM | POA: Diagnosis not present

## 2024-02-14 DIAGNOSIS — M25579 Pain in unspecified ankle and joints of unspecified foot: Secondary | ICD-10-CM | POA: Diagnosis not present

## 2024-02-14 DIAGNOSIS — M25569 Pain in unspecified knee: Secondary | ICD-10-CM | POA: Diagnosis not present

## 2024-02-14 DIAGNOSIS — M542 Cervicalgia: Secondary | ICD-10-CM | POA: Diagnosis not present

## 2024-02-14 DIAGNOSIS — M545 Low back pain, unspecified: Secondary | ICD-10-CM | POA: Diagnosis not present

## 2024-02-24 ENCOUNTER — Encounter: Payer: Self-pay | Admitting: *Deleted

## 2024-02-28 ENCOUNTER — Ambulatory Visit: Admitting: Neurology

## 2024-02-28 ENCOUNTER — Encounter: Payer: Self-pay | Admitting: Neurology

## 2024-02-28 VITALS — BP 111/68 | HR 62 | Ht 67.0 in | Wt 191.0 lb

## 2024-02-28 DIAGNOSIS — F419 Anxiety disorder, unspecified: Secondary | ICD-10-CM

## 2024-02-28 DIAGNOSIS — Z8669 Personal history of other diseases of the nervous system and sense organs: Secondary | ICD-10-CM

## 2024-02-28 DIAGNOSIS — M25579 Pain in unspecified ankle and joints of unspecified foot: Secondary | ICD-10-CM | POA: Diagnosis not present

## 2024-02-28 DIAGNOSIS — M25519 Pain in unspecified shoulder: Secondary | ICD-10-CM | POA: Diagnosis not present

## 2024-02-28 DIAGNOSIS — R6889 Other general symptoms and signs: Secondary | ICD-10-CM

## 2024-02-28 DIAGNOSIS — R4789 Other speech disturbances: Secondary | ICD-10-CM

## 2024-02-28 DIAGNOSIS — Z9189 Other specified personal risk factors, not elsewhere classified: Secondary | ICD-10-CM

## 2024-02-28 DIAGNOSIS — R351 Nocturia: Secondary | ICD-10-CM | POA: Diagnosis not present

## 2024-02-28 DIAGNOSIS — M25569 Pain in unspecified knee: Secondary | ICD-10-CM | POA: Diagnosis not present

## 2024-02-28 DIAGNOSIS — M542 Cervicalgia: Secondary | ICD-10-CM | POA: Diagnosis not present

## 2024-02-28 DIAGNOSIS — M545 Low back pain, unspecified: Secondary | ICD-10-CM | POA: Diagnosis not present

## 2024-02-28 NOTE — Progress Notes (Signed)
 Subjective:    Patient ID: Ruth Sharp is a 73 y.o. female.  HPI    Ruth Fairy, MD, PhD [facility] [address], [address] [address] [place], [place] [address]  Dear Dr. Donalynn Fry,  I saw your patient, Ruth Sharp, upon your kind request in my neurologic clinic today for evaluation of her memory loss.  The patient is unaccompanied today.  As you know, [name] is a 73 year old female with an underlying medical history of rheumatoid arthritis (on Plaquenil ), tachycardia, hyperlipidemia, thrombocytopenia, uterine cancer, anxiety, tremor, and mild obesity, who reports an approximately 2-year history of forgetfulness.  She has word finding difficulty and name recall issues, does not report a family history of Alzheimer's dementia or memory loss, mom lived to be in her 35s and died suddenly, dad died from cancer and was about 30.  She has 1 younger sister who does not have any memory issues.  She denies any strokelike symptoms, no headaches, no morning headaches, sleeps a little bit better, would be willing to pursue a home sleep test at this time.  She has nocturia about once or twice per average night.  She still has residual anxiety but is better with buspirone, takes sertraline 150 mg daily, not sure if she takes Wellbutrin still.   She limits her caffeine, usually has 1 soda at lunch, tries to hydrate well, estimates that she drinks at least 48 ounces of water per day, rare alcohol, she is a non-smoker.  She denies any confusion while driving but a few weeks ago she had a short episode of confusion at home.  She has not left anything on the stove.   I had evaluated her last year for a tremor.  She reported forgetfulness at the time.  We talked about her sleep disturbance as well and she was advised to proceed with sleep testing.  She did not pursue the home sleep test.   I reviewed your office note from 09/22/2023.  She reported difficulty with name recall and word  finding difficulty.  She had depressive symptoms at the time and was started on buspirone.  She had blood work on 09/22/2023 and I have reviewed the results in her paper chart: CMP showed potassium mildly elevated at 5.7, otherwise normal findings. I reviewed your office note from 11/12/2023.  Her BuSpar was increased to 10 mg twice daily.  She had a brain MRI without contrast on 10/15/2023 and I reviewed the results:   IMPRESSION: 1. No evidence of an acute intracranial abnormality. 2. Mild chronic small vessel ischemic changes within the cerebral white matter. 3. Mild generalized cerebral volume loss.    In addition, I personally and independently reviewed images through the PACS system.  Previously:  09/17/2022: Ruth Sharp is a 73 year old LH female with an underlying medical history of arthritis, hyperlipidemia, PACs, tachycardia, thrombocythemia, history of uterine cancer, anxiety, and mildly overweight state, who reports a several week/few month history of primarily left hand tremors noticeable when she is walking.  Symptoms are particularly noticeable when she holds the leash for her dogs with her left hand.  She does not really have a right hand tremor, has not noticed a tremor with holding anything else such as a cup or with handwriting.  1 time she noticed a slight tremor in her left hand when she was holding a measuring cup while baking.  She has not had any major impairment in her day-to-day function but got concerned about the tremor.  She  feels that it is more consistent now whenever she holds the leash.  She has not had any pain.  She has not noticed any tremor in her lower extremities or upper body.  She has a family history of tremors affecting her mom, she had a tremor in her hands and her handwriting got worse when she was older.  Mom had very good handwriting before.  Patient reports that her own handwriting has not changed.  She has never been very good at cursive writing.  She is  without any major balance issues, has a history of arthritis, she has fallen recently.  She also reports some forgetfulness.  She has not had a physical and recent blood work through your office.  She is agreeable to scheduling a full physical.  As far as triggers, she has not really noticed any obvious triggers.  She drinks caffeine in limitation in the form of soda at lunch, usually 1 serving.  She may not hydrate all that well every day, depends on how thirsty she feels.  She drinks alcohol occasionally, she is a non-smoker.  She is divorced and lives alone, 2 dogs in the household, she has 1 grown son, age 53 without any obvious history of tremors.  She is retired.  She had both knees replaced, she had a tonsillectomy as a child.  Of note, she does not sleep well, she reports sleep disruption and nocturia about twice per average night, no recurrent morning headaches but has had the occasional pressure sensation.  She had a sleep study some 10 years ago and was diagnosed with sleep apnea and was prescribed a CPAP machine but never got around to getting a machine.  She would be willing to get reevaluated.  I reviewed your office note from 08/04/2022.  She was noted to have a mild postural tremor on exam.  Recent blood work was not included in the referral, she had blood work through oncology on 07/20/2022 which showed a benign CBC with differential, elevated LDH at 223, and normal CMP.  Her Past Medical History Is Significant For: Past Medical History:  Diagnosis Date   Arthritis    Tachycardia     Her Past Surgical History Is Significant For: Past Surgical History:  Procedure Laterality Date   ABDOMINAL HYSTERECTOMY     BREAST EXCISIONAL BIOPSY Right    benign   knee replacements      Her Family History Is Significant For: Family History  Problem Relation Age of Onset   Heart failure Mother    Tremor Mother    Parkinson's disease Neg Hx    Alzheimer's disease Neg Hx    Dementia Neg Hx      Her Social History Is Significant For: Social History   Socioeconomic History   Marital status: Divorced    Spouse name: Not on file   Number of children: Not on file   Years of education: Not on file   Highest education level: Not on file  Occupational History   Not on file  Tobacco Use   Smoking status: Never   Smokeless tobacco: Never  Vaping Use   Vaping status: Never Used  Substance and Sexual Activity   Alcohol use: Yes    Comment: occasionally   Drug use: Never   Sexual activity: Not on file  Other Topics Concern   Not on file  Social History Narrative   Pt lives alone    Retired    Social Drivers of Health  Financial Resource Strain: Not on file  Food Insecurity: Not on file  Transportation Needs: Not on file  Physical Activity: Not on file  Stress: Not on file  Social Connections: Not on file    Her Allergies Are:  Allergies  Allergen Reactions   Keflex [Cephalexin] Rash   Tape Rash  :   Her Current Medications Are:  Outpatient Encounter Medications as of 02/28/2024  Medication Sig   atorvastatin  (LIPITOR) 20 MG tablet Take 1 tablet (20 mg total) by mouth daily.   Bacillus Coagulans-Inulin (PROBIOTIC) 1-250 BILLION-MG CAPS Take 1 capsule by mouth daily. Pt states uses Wilmer Hash brand   Calcium  Carbonate-Vitamin D (CALTRATE 600+D PO) Take 1 tablet by mouth daily.   hydroxychloroquine  (PLAQUENIL ) 200 MG tablet Take 2 tablets (400 mg total) by mouth daily.   hydroxyurea  (HYDREA ) 500 MG capsule TAKE 1 CAPSULE BY MOUTH EVERY OTHER DAY. ALTERNATING TAKE 2 CAPSULES AS DIRECTED.   metoprolol  tartrate (LOPRESSOR ) 50 MG tablet TAKE 1 AND 1/2 TABLETS BY MOUTH TWICE A DAY   Multiple Vitamins-Minerals (CENTRUM SILVER) CHEW Chew 1 each by mouth daily.   sertraline (ZOLOFT) 50 MG tablet Take 100 mg by mouth daily.   TURMERIC PO Take by mouth.   TYRVAYA 0.03 MG/ACT SOLN Place 1 spray into both nostrils 2 (two) times daily. Per opthamology   buPROPion  (WELLBUTRIN) 75 MG tablet Take 75 mg by mouth 2 (two) times daily.   FeFum-FePoly-FA-B Cmp-C-Biot (FOLIVANE-PLUS) CAPS TAKE ONE CAPSULE BY MOUTH ONCE DAILY   ibuprofen (ADVIL) 200 MG tablet Take 1 tablet by mouth daily as needed.   No facility-administered encounter medications on file as of 02/28/2024.  :   Review of Systems:  Out of a complete 14 point review of systems, all are reviewed and negative with the exception of these symptoms as listed below:  Review of Systems  Neurological:        Pt here for memory decline  Pt states short term memory is worse     Objective:  Neurological Exam  Physical Exam Physical Examination:   Vitals:   02/28/24 0813  BP: 111/68  Pulse: 62    General Examination: The patient is a very pleasant 73 y.o. female in no acute distress. She appears well-developed and well-nourished and well groomed.   HEENT: Normocephalic, atraumatic, pupils are equal, round and reactive to light, extraocular tracking is good without limitation to gaze excursion or nystagmus noted. Hearing is grossly intact. Face is symmetric with normal facial animation. Speech is clear with no dysarthria noted. There is no hypophonia. There is no lip, neck/head, jaw or voice tremor. Neck is supple with full range of passive and active motion. There are no carotid bruits on auscultation. Oropharynx exam reveals: mild mouth dryness, good dental hygiene and mild airway crowding, due to smaller airway entry, Mallampati class II, tonsils absent.  Tongue protrudes centrally and palate elevates symmetrically.     Chest: Clear to auscultation without wheezing, rhonchi or crackles noted.   Heart: S1+S2+0, regular and normal without murmurs, rubs or gallops noted.    Abdomen: Soft, non-tender and non-distended.   Extremities: There is mild puffiness in the distal lower extremities bilaterally.    Skin: Warm and dry without trophic changes noted.    Musculoskeletal: exam reveals  arthritic changes in both hands particularly left hand.  Status post bilateral knee replacements, right knee wider than left.  Slight increase in upper back curvature.  Leans to the right with her upper body  while sitting and standing.   Neurologically:  Mental status: The patient is awake, alert and oriented in all 4 spheres. Her immediate and remote memory, attention, language skills and fund of knowledge are fairly appropriate. There is no evidence of aphasia, agnosia, apraxia or anomia. Speech is clear with normal prosody and enunciation. Thought process is linear. Mood is normal and affect is normal.  Cranial nerves II - XII are as described above under HEENT exam.  Motor exam: Normal bulk, strength and tone is noted.  No postural tremor, no action tremor, no resting tremor, no lower extremity tremor, no intention tremor.  (On 09/17/2022: On Archimedes spiral drawing there is very mild tremulousness with the right hand and minimal with the left hand.  Handwriting is legible, not tremulous, not micrographic.)     Fine motor skills and coordination: Normal finger taps, hand movements and rapid alternating patting bilaterally in the upper extremities, normal foot taps and foot agility bilaterally in the lower extremities.    Cerebellar testing: No dysmetria or intention tremor. There is no truncal or gait ataxia.  Normal finger-to-nose and normal heel-to-shin bilaterally.   Reflexes are 1+ in the upper extremities, trace in the knees and trace in the ankles bilaterally.    Sensory exam: intact to light touch in the upper and lower extremities.  Gait, station and balance: She stands without difficulty, posture is slightly stooped forward in the upper back with mild increase in curvature noted and slight tilt to the right side.  She walks without a walking aid, no shuffling, normal pace, slight reduction in arm swing bilaterally.  No tremor while walking.   Assessment and Plan:  In summary, TAEGEN LENNOX is a 73 year old female with an underlying medical history of rheumatoid arthritis (on Plaquenil ), tachycardia, hyperlipidemia, thrombocytopenia, uterine cancer, anxiety, tremor, and mild obesity, who presents for evaluation of her forgetfulness of approximately 2 years duration.  Neurological exam is nonfocal.  We talked about causes of memory loss at length today.  She is advised to proceed with additional testing.  She had a recent brain MRI we talked about the results.  We will do some blood work today including TSH and B12 level.  We will proceed with a home sleep test to evaluate her for sleep apnea.  If she has obstructive sleep apnea I will offer her AutoPap therapy.  She would be willing to consider this.  Years ago she tried CPAP therapy but could not tolerate the mask, broke out in a rash from the mask.   I would like to refer her to neuropsychology for a formal cognitive testing.  She is agreeable.  I explained this evaluation and testing to her.    We will keep her posted as to her results.  For now, we will plan a follow-up accordingly, I would be happy to reevaluate her for her tremor in the future as well.  I answered all her questions today and she was in agreement with our plan.    Thank you very much for allowing me to participate in the care of this nice patient. If I can be of any further assistance to you please do not hesitate to call me at [phone].   Sincerely,     Ruth Fairy, MD, PhD  I spent 45 minutes in total face-to-face time and in reviewing records during pre-charting, more than 50% of which was spent in counseling and coordination of care, reviewing test results, reviewing medications and treatment regimen and/or  in discussing or reviewing the diagnosis of memory loss/forgetfulness, OSA, the prognosis and treatment options. Pertinent laboratory and imaging test results that were available during this visit with the patient were reviewed by me and considered in  my medical decision making (see chart for details).

## 2024-02-29 ENCOUNTER — Encounter: Payer: Self-pay | Admitting: Psychology

## 2024-02-29 ENCOUNTER — Ambulatory Visit: Payer: Self-pay | Admitting: Neurology

## 2024-02-29 DIAGNOSIS — G4733 Obstructive sleep apnea (adult) (pediatric): Secondary | ICD-10-CM

## 2024-02-29 DIAGNOSIS — G4734 Idiopathic sleep related nonobstructive alveolar hypoventilation: Secondary | ICD-10-CM

## 2024-02-29 LAB — TSH: TSH: 2.4 u[IU]/mL (ref 0.450–4.500)

## 2024-02-29 LAB — B12 AND FOLATE PANEL
Folate: >20 ng/mL
Vitamin B-12: 582 pg/mL (ref 232–1245)

## 2024-03-01 NOTE — Telephone Encounter (Signed)
 Called pt and informed her of normal B12, folate, and TSH. Pt verbalized understanding and appreciation. She would like the results sent to pcp.   Results sent to Dr Donalynn Fry.

## 2024-03-06 DIAGNOSIS — M25519 Pain in unspecified shoulder: Secondary | ICD-10-CM | POA: Diagnosis not present

## 2024-03-06 DIAGNOSIS — M25569 Pain in unspecified knee: Secondary | ICD-10-CM | POA: Diagnosis not present

## 2024-03-06 DIAGNOSIS — M25579 Pain in unspecified ankle and joints of unspecified foot: Secondary | ICD-10-CM | POA: Diagnosis not present

## 2024-03-06 DIAGNOSIS — M545 Low back pain, unspecified: Secondary | ICD-10-CM | POA: Diagnosis not present

## 2024-03-06 DIAGNOSIS — M542 Cervicalgia: Secondary | ICD-10-CM | POA: Diagnosis not present

## 2024-03-13 DIAGNOSIS — M25519 Pain in unspecified shoulder: Secondary | ICD-10-CM | POA: Diagnosis not present

## 2024-03-13 DIAGNOSIS — M542 Cervicalgia: Secondary | ICD-10-CM | POA: Diagnosis not present

## 2024-03-13 DIAGNOSIS — M25579 Pain in unspecified ankle and joints of unspecified foot: Secondary | ICD-10-CM | POA: Diagnosis not present

## 2024-03-13 DIAGNOSIS — F331 Major depressive disorder, recurrent, moderate: Secondary | ICD-10-CM | POA: Diagnosis not present

## 2024-03-13 DIAGNOSIS — E785 Hyperlipidemia, unspecified: Secondary | ICD-10-CM | POA: Diagnosis not present

## 2024-03-13 DIAGNOSIS — F411 Generalized anxiety disorder: Secondary | ICD-10-CM | POA: Diagnosis not present

## 2024-03-13 DIAGNOSIS — N1831 Chronic kidney disease, stage 3a: Secondary | ICD-10-CM | POA: Diagnosis not present

## 2024-03-13 DIAGNOSIS — M25569 Pain in unspecified knee: Secondary | ICD-10-CM | POA: Diagnosis not present

## 2024-03-13 DIAGNOSIS — M545 Low back pain, unspecified: Secondary | ICD-10-CM | POA: Diagnosis not present

## 2024-03-14 ENCOUNTER — Other Ambulatory Visit: Payer: Self-pay | Admitting: Cardiovascular Disease

## 2024-03-14 ENCOUNTER — Ambulatory Visit (INDEPENDENT_AMBULATORY_CARE_PROVIDER_SITE_OTHER): Admitting: Neurology

## 2024-03-14 DIAGNOSIS — Z8669 Personal history of other diseases of the nervous system and sense organs: Secondary | ICD-10-CM

## 2024-03-14 DIAGNOSIS — R6889 Other general symptoms and signs: Secondary | ICD-10-CM

## 2024-03-14 DIAGNOSIS — G4734 Idiopathic sleep related nonobstructive alveolar hypoventilation: Secondary | ICD-10-CM

## 2024-03-14 DIAGNOSIS — R4789 Other speech disturbances: Secondary | ICD-10-CM

## 2024-03-14 DIAGNOSIS — G4733 Obstructive sleep apnea (adult) (pediatric): Secondary | ICD-10-CM

## 2024-03-14 DIAGNOSIS — Z9189 Other specified personal risk factors, not elsewhere classified: Secondary | ICD-10-CM

## 2024-03-14 DIAGNOSIS — R351 Nocturia: Secondary | ICD-10-CM

## 2024-03-14 DIAGNOSIS — F419 Anxiety disorder, unspecified: Secondary | ICD-10-CM

## 2024-03-15 NOTE — Procedures (Signed)
 [facility]  HOME SLEEP TEST (Watch PAT) REPORT  STUDY DATE: 03/14/2024  DOB: [birth year]  MRN: [id number]  ORDERING CLINICIAN: True Mar, MD, PhD   REFERRING CLINICIAN: Dayna Motto, DO   CLINICAL INFORMATION/HISTORY: 73 year old female with an underlying medical history of rheumatoid arthritis (on Plaquenil ), tachycardia, hyperlipidemia, thrombocytopenia, uterine cancer, anxiety, tremor, and mild obesity, who reports an approximately 2-year history of forgetfulness.  She also reports problems with her sleep.    BMI: 29.9 kg/m  FINDINGS:   Sleep Summary:   Total Recording Time (hours, min): 8 hours, 12 min  Total Sleep Time (hours, min):  7 hours, 16 min  Percent REM (%):    19.8%   Respiratory Indices:   Calculated pAHI (per hour):  13.2/hour         REM pAHI:    25.9/hour       NREM pAHI: 10/hour  Central pAHI: 0.7/hour  Oxygen Saturation Statistics:    Oxygen Saturation (%) Mean: 90%   Minimum oxygen saturation (%):                 75%   O2 Saturation Range (%): 75-97%    O2 Saturation (minutes) <=88%: 44.1 min  Pulse Rate Statistics:   Pulse Mean (bpm):    62/min    Pulse Range (53-99/min)   IMPRESSION:   OSA (obstructive sleep apnea)  Nocturnal Hypoxemia  RECOMMENDATION:  This home sleep test demonstrates overall mild obstructive sleep apnea by number of events but more significant by oxygen criteria.  Her total AHI was 13.2/h, utilizing the 4% desaturation criteria for obstructive hypopneas per Medicare guidelines.  Average oxygen saturation was only 90%, nadir was 75% with significant time below or at 88% saturation of over 40 minutes for the night,  indicating nocturnal hypoxemia.  Variable snoring was detected, mostly in the mild to moderate range and at times intermittent.  Treatment with a positive airway pressure device is recommended. Treatment can be achieved in the form of autoPAP trial/titration at home for now. A  full night, in-lab PAP titration study may aid in improving proper treatment settings and with mask fit, if needed, down the road. Alternative treatments may include weight loss (where appropriate) along with avoidance of the supine sleep position (if possible), or an oral appliance in appropriate candidates.   Please note that untreated obstructive sleep apnea may carry additional perioperative morbidity. Patients with significant obstructive sleep apnea should receive perioperative PAP therapy and the surgeons and particularly the anesthesiologist should be informed of the diagnosis and the severity of the sleep disordered breathing. The patient should be cautioned not to drive, work at heights, or operate dangerous or heavy equipment when tired or sleepy. Review and reiteration of good sleep hygiene measures should be pursued with any patient. Other causes of the patient's symptoms, including circadian rhythm disturbances, an underlying mood disorder, medication effect and/or an underlying medical problem cannot be ruled out based on this test. Clinical correlation is recommended.  The patient and her referring provider will be notified of the test results. The patient will be seen in follow up in sleep clinic at [facility], as necessary.  I certify that I have reviewed the raw data recording prior to the issuance of this report in accordance with the standards of the American Academy of Sleep Medicine (AASM).    INTERPRETING PHYSICIAN:   True Mar, MD, PhD Medical Director, [facility]) Diplomat, ABPN (Neurology and Sleep)   [facility] [address] [place], [place] [address] [phone]

## 2024-03-15 NOTE — Progress Notes (Signed)
 See procedure note.

## 2024-03-15 NOTE — Addendum Note (Signed)
 Addended by: Vennela Jutte on: 03/15/2024 05:58 PM   Modules accepted: Orders

## 2024-03-18 ENCOUNTER — Other Ambulatory Visit: Payer: Self-pay | Admitting: Cardiovascular Disease

## 2024-03-20 DIAGNOSIS — M25519 Pain in unspecified shoulder: Secondary | ICD-10-CM | POA: Diagnosis not present

## 2024-03-20 DIAGNOSIS — M25569 Pain in unspecified knee: Secondary | ICD-10-CM | POA: Diagnosis not present

## 2024-03-20 DIAGNOSIS — M542 Cervicalgia: Secondary | ICD-10-CM | POA: Diagnosis not present

## 2024-03-20 DIAGNOSIS — M25579 Pain in unspecified ankle and joints of unspecified foot: Secondary | ICD-10-CM | POA: Diagnosis not present

## 2024-03-20 DIAGNOSIS — M545 Low back pain, unspecified: Secondary | ICD-10-CM | POA: Diagnosis not present

## 2024-03-28 NOTE — Telephone Encounter (Signed)
-----   Message from True Mar sent at 03/15/2024  5:58 PM EDT ----- Patient was seen on 02/28/2024 for memory concerns.  She had a home sleep test on 03/14/2024.  Please advise patient that her home sleep test shows obstructive sleep apnea and significant oxygen  desaturations as low as 75%.  Having sleep apnea may tie in with her memory concerns.  I would recommend that we start her on home AutoPap therapy.  I have placed an order in her chart and we can  send the order to a DME company of her choice and they will educate her on using the machine and finding a mask for her.  She will need to follow-up in sleep clinic to see myself or one of our nurse  practitioners within 2 to 3 months of starting home AutoPap therapy.  Please go over compliance expectations with her as well. ----- Message ----- From: Mar True, MD Sent: 03/15/2024   5:56 PM EDT To: True Mar, MD

## 2024-03-28 NOTE — Telephone Encounter (Signed)
 I called pt and gave her the results of the sleep study as per below. OSA and  significant desaturations below 75%  autopap is recommended.  This may help her memory as well.   DME [facility] will be sent the order they will call after authorization done and then have her in the office to go over use of machine, supplies and mask fitting.  She will call for 2-3 month appt with us  for insurance compliance appt.  She understands to use 4 hrs or more (medically).  Order sent to [facility].

## 2024-04-04 DIAGNOSIS — J019 Acute sinusitis, unspecified: Secondary | ICD-10-CM | POA: Diagnosis not present

## 2024-04-05 ENCOUNTER — Encounter: Attending: Psychology | Admitting: Psychology

## 2024-04-05 DIAGNOSIS — R4189 Other symptoms and signs involving cognitive functions and awareness: Secondary | ICD-10-CM | POA: Insufficient documentation

## 2024-04-05 NOTE — Progress Notes (Signed)
 NEUROPSYCHOLOGICAL EVALUATION [place]  Physical Medicine and Rehabilitation     Patient: Ruth Sharp  MRN: [id number] DOB: 1951-03-03  Age: 73 y.o. Sex: female  Race/Ethnicity: White or Caucasian  Years of Education: 18 Handedness: Left  Collateral Information Source: Sister  Referring Provider: Buck Saucer, MD  Provider/Clinical Neuropsychologist: Evalene DOROTHA Riff, PsyD  Date of Service: 04/05/2024 Start Time: 8 AM End Time: 10 AM  Location of Service:  [facility] Physical Medicine & Rehabilitation Department [place] [address] [place] [place], [place] [address] Phone: [phone]  Billing Code/Service:            96116/96121  Individuals Present: Patient was seen accompanied by her sister with her permission, in-person, by the provider. 1 hour and 15 minutes spent in face-to-face clinical interview and remaining 45 minutes was spent in record review, documentation, and testing protocol construction.    PATIENT CONSENT AND CONFIDENTIALITY The patient's understanding of the reason for referral was intact. Discussed limits of confidentiality including, but not limited to, posting of final evaluation report in the patient's electronic medical record for both the patient and for the referring provider and appropriate medical professionals. Patient was given the opportunity to have their questions answered. The neuropsychological evaluation process was discussed with the patient and they consented to proceed with the evaluation.  Consent for Evaluation and Treatment: Signed: Yes Explanation of Privacy Policies: Signed: Yes Discussion of Confidentiality Limits: Yes  REASON FOR REFERRAL:The patient was referred for neuropsychological evaluation by her neurologist, Dr. Buck, due to concerns of cognitive difficulties involving word finding and short-term memory. The patient has a medical history of medical history of  rheumatoid arthritis (on Plaquenil ), tachycardia, hyperlipidemia, thrombocytopenia, uterine cancer, anxiety, tremor, and mild and obesity. Referring provider records indicate patient reports increased forgetfulness and word finding difficulty over the prior two years. She has no known family history of Alzheimer's dementia or memory loss. She has been diagnosed with sleep apnea. At the time of the evaluation, she was awaiting receipt of cPAP/aPAP device. She is ultimately hoping to have Inspire implant due to difficulty tolerating the mask in the past. She has a history of depression and anxiety, managed via medication. 10/15/2023 MRI reportedly showed mild chronic white matter changes and mild generalized volume loss. Records note the patient initially presented to neurology on 09/17/2022 with left hand tremor. Per records from that visit, tremor was not observed by her physician during that visit. She has family history of tremors in her mother. Neurological exam was reported to be non-focal per records from her 02/28/24 visit.    Upon interview, the patient and sister expressed interest in ongoing evaluation to determine what was underlying the cognitive changes  HISTORY OF PRESENTING CONCERNS:  Cognitive Symptom Onset & Course: Cognitive changes began approximately five years ago per patient report.  Her sister first noticed changes around two years ago. Course was described as progressive and onset was gradual.   Current Cognitive Complaints:  Memory: Per patient and collateral, the patient has some difficulty remembering conversations and recent events. She needs to write things down to track medical appointments consistently. One instance of getting lost while driving and unable to remember how to get home, though this was not a frequently visited location. Denies misplacing things more often. Sister denies noticing patient repeating herself unintentionally  Processing Speed: Slower processing speed  noted by both patient and sister, worsened over the last year. Issue relatively consistent over  time with significant day-to-day variability. Able to complete tasks in roughly same amount of time as usual. No problems keeping up with subtitles when watching TV.  Attention & Concentration: No significant changes noted other than sometimes losing train of thought, possibly related to word finding difficulties interrupting thought process. Sister did not notice significant changes in attention and concentration  Language: Difficulty with word finding. Sister did not notice paraphasic errors. Per sister, the patient appears to struggle with following complex discussions, or plans involving multiple steps. Some interference may potentially be related to hearing loss/hearing difficulties. Feels difficulty pronouncing words or that speech is slowed. Denies problems with reading or writing composition. Slightly slower with mental arithmetic.  Visual-Spatial: Denies clear indications of difficulty with visual spatial abilities  Executive Functioning: Some long-running tendencies towards indecision but no major difficulties with decision-making, problem-solving, planning, or organization. Denies significant behavioral changes involving impulsivity or major changes with respect to frustration tolerance  Motor/Sensory Complaints:   Sensory changes: Declines in sense of smell since COVID infection around two years ago.  Also endorsed declines in sense of taste. Has hearing aids for about three years, starting to wear them more frequently.  No significant difficulties with vision. Balance/coordination difficulties: Difficulties with balance, has had three falls with last being about a year ago. Falls occurred within a few months of each other. Frequent dizziness/vertigo: Denies problems with dizziness or vertigo Other motor difficulties: Per patient report, essential tremor for last three years, most prominent when  holding leash and walking dog/in action. Tremor is worse in dominant left hand although reported to be present in her right hand as well. History of tremor reported in her mother.   Emotional and Behavioral Functioning:  Patient endorsed a long history of depression starting in childhood.  She has been on medication for mood possibly dating to about 20 years ago.  She has never been involved in individual therapy.  She reported a remote history of suicidal ideation but no history of attempts or psychiatric admissions. She had some medication adjustments around two years ago which she found beneficial.  Depression: Denies currently being in depressive episode. Denies frequent feelings of sadness, feelings of guilt, hopelessness, or anhedonia.  No suicidal ideation. Anxiety: No clear indications of generalized anxiety or physiological symptoms of anxiety Other: No history of hallucination, paranoia, symptoms of mania, or apathy. No indications of PTSD.   Sleep: Indicated she may get about 7 hours of sleep per night on a good night.  Little trouble falling asleep, can usually return to sleep after waking during night. Sleep study completed, diagnosed with sleep apnea. Currently waiting to receive machine, intends to meet compliance requirements then proceed with Inspire device implant. Does not feel rested in morning and has had difficulties for number of years Appetite: Minor variability but no significant weight loss or gain Caffeine: Relatively limited consumption Alcohol Use: Relatively limited consumption Tobacco Use: Never consumed Recreational Substance Use: Never consumed  Level of Functional Independence: The patient is intact with basic activities of daily living.  Finances: Intact Shopping / Meal Preparation: Intact Household Maintenance / Chores: Therapist, nutritional / Future Obligations: Intact but described increased reliance on reminders and notes. Medication Management:  Intact with the use of pillbox. Driving: Intact although limits driving at night due to vision difficulties.  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   10/15/23 EXAM: MRI HEAD WITHOUT CONTRAST FINDINGS: Brain: Mild generalized cerebral volume loss. Multifocal T2 FLAIR hyperintense signal abnormality within the cerebral white matter, nonspecific but compatible with mild chronic small vessel ischemic disease. Prominent perivascular spaces throughout the bilateral cerebral white matter. There is no acute infarct. No evidence of an intracranial mass. No chronic intracranial blood products. No extra-axial fluid collection. No midline shift. Vascular: Maintained flow voids within the proximal large arterial vessels. Skull and upper cervical spine: No focal worrisome marrow lesion. Sinuses/Orbits: No mass or acute finding within the imaged orbits. Prior bilateral ocular lens replacement. No significant paranasal sinus disease. IMPRESSION: 1. No evidence of an acute intracranial abnormality. 2. Mild chronic small vessel ischemic changes within the cerebral white matter. 3. Mild  generalized cerebral volume loss.   Family Neurologic/Medical Hx: None Family History  Problem Relation Age of Onset   Heart failure Mother    Tremor Mother    Parkinson's disease Neg Hx    Alzheimer's disease Neg Hx    Dementia Neg Hx    Medications:  atorvastatin  (LIPITOR) 20 MG tablet Bacillus Coagulans-Inulin (PROBIOTIC) 1-250 BILLION-MG CAPS buPROPion (WELLBUTRIN) 75 MG tablet Calcium  Carbonate-Vitamin D (CALTRATE 600+D PO) FeFum-FePoly-FA-B Cmp-C-Biot (FOLIVANE-PLUS) CAPS hydroxychloroquine  (PLAQUENIL ) 200 MG tablet hydroxyurea  (HYDREA ) 500 MG capsule ibuprofen (ADVIL) 200 MG tablet metoprolol  tartrate (LOPRESSOR ) 50 MG tablet Multiple Vitamins-Minerals (CENTRUM SILVER) CHEW sertraline (ZOLOFT) 50 MG tablet TURMERIC PO TYRVAYA 0.03 MG/ACT SOLN   Academic/Vocational History:Master's degree in textiles.  Typically earned B's.  No learning difficulties or indications of attention or behavioral issues growing up. 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 Divorced 2018 Children/Grandchildren: One adult child.  Living Situation: Lives alone with her dogs.   Mental Status/Behavioral Observations: The patient was seen on an outpatient basis in the [facility] PM&R office for the clinical interview accompanied by her sister and with her permission. Sensorium/Arousal: Patient was alert.  Hearing and vision were adequate for the purposes of the visit. Orientation: WNL Appearance: Appropriate dress and hygiene Behavior: WNL Speech/Language: Expressive speech was prosodic, fluent, and well-articulated. Although not marked, her rate of speech was slightly slow at times. No clear difficulties with receptive speech.  Motor: Ambulated independently and without issue.  Social Comportment: Appropriate for the setting.  Mood: Euthymic Affect: Congruent Thought  Process/Content: Coherent, linear, goal directed. Ability to Participate in Interview: Readily answered all questions with adequate detail regarding personal history.  Insight: Good.   SUMMARY / CLINICAL IMPRESSIONS The patient was referred for neuropsychological evaluation by her neurologist, Dr. Buck, due to concerns of cognitive difficulties involving word finding and short-term memory. Referring provider records indicate patient reports increased forgetfulness over the prior two years. She has no known family history of Alzheimer's dementia or  memory loss. She has been diagnosed with sleep apnea. At the time of the evaluation, she was awaiting receipt of cPAP/aPAP device. She is ultimately hoping to have Inspire implant due to difficulty tolerating the mask in the past. She has a history of depression and anxiety, managed via medication. 10/15/2023 MRI reportedly showed mild chronic white matter changes and mild generalized volume loss. Records note the patient initially presented to neurology on 09/17/2022 with left hand tremor. She has family history of tremors in her mother. Neurological exam was reported to be non-focal per records from her 02/28/24 visit. Upon interview, the patient and sister expressed interest in ongoing evaluation to determine what was underlying the cognitive changes.  Per patient and collateral report during interview, cognitive symptoms were gradual in onset and progressive. Symptoms became noticeable to the patient around give years ago. Her sister reported noticing changes around two years ago. Difficulties involve short term memory, possible slowed processing, and difficulties tracking conversation when content complexity increases. She describes experiences of tremor primarily in her left (dominant) hand. No significant difficulties with attention, visual spatial abilities, or executive functioning reported. She described feeling less steady in terms of balance. She has  hearing loss, which may have impacted auditory receptive speech. She is wearing her hearing aids (obtained 3 years ago) more frequently now. She is functionally intact with both basic and instrumental activities of daily living. No reports of transient AMS or RBDs. She has a history of depression, but denies significant symptoms at present. Sleep difficulties are notable, and a potential contributor to her cognitive symptoms. Formal neurocognitive testing would be beneficial to determine the patient's cognitive functioning though objective performance data. Data will facilitate differential diagnosis and treatment planning.   DISPOSITION / PLAN The patient has been set up for a formal neuropsychological assessment to objectively assess her cognitive functioning across domains to establish the patient's cognitive profile. This data, in conjunction with information obtained via clinical interview and medical record review, will help clarify likely etiology and guide treatment recommendations. Once data collection and interpretation have been completed, the findings / diagnosis and recommendations will be reviewed and discussed with the patient during a feedback appointment with the neuropsychologist. Based on the collaborative dialogue with the patient during the feedback, recommendations may be adjusted / tailored as needed. A formal report will be produced and provided to the patient and the referring provider.   Note: Discussed testing date in relation to cPAP/aPAP and sleep study, and date was selected to ideally occur after patient has initiated treatment for OSA for 30+ days due to transient impact potential on cognitive functioning and test data and cognitive profile.    Diagnosis: Cognitive Changes                Evalene DOROTHA Riff, PsyD             Neuropsychologist   This report was generated using voice recognition software. While this document has been carefully reviewed, transcription  errors may be present. I apologize in advance for any inconvenience. Please contact me if further clarification is needed.

## 2024-04-06 DIAGNOSIS — U071 COVID-19: Secondary | ICD-10-CM | POA: Diagnosis not present

## 2024-04-13 DIAGNOSIS — F411 Generalized anxiety disorder: Secondary | ICD-10-CM | POA: Diagnosis not present

## 2024-04-13 DIAGNOSIS — E785 Hyperlipidemia, unspecified: Secondary | ICD-10-CM | POA: Diagnosis not present

## 2024-04-13 DIAGNOSIS — N1831 Chronic kidney disease, stage 3a: Secondary | ICD-10-CM | POA: Diagnosis not present

## 2024-04-13 DIAGNOSIS — F331 Major depressive disorder, recurrent, moderate: Secondary | ICD-10-CM | POA: Diagnosis not present

## 2024-04-19 ENCOUNTER — Encounter

## 2024-04-24 ENCOUNTER — Inpatient Hospital Stay: Attending: Internal Medicine

## 2024-04-24 ENCOUNTER — Inpatient Hospital Stay: Admitting: Internal Medicine

## 2024-04-24 VITALS — BP 114/72 | HR 62 | Temp 97.2°F | Resp 16 | Ht 67.0 in | Wt 184.0 lb

## 2024-04-24 DIAGNOSIS — D509 Iron deficiency anemia, unspecified: Secondary | ICD-10-CM | POA: Diagnosis not present

## 2024-04-24 DIAGNOSIS — D473 Essential (hemorrhagic) thrombocythemia: Secondary | ICD-10-CM

## 2024-04-24 DIAGNOSIS — Z79899 Other long term (current) drug therapy: Secondary | ICD-10-CM | POA: Insufficient documentation

## 2024-04-24 LAB — CBC WITH DIFFERENTIAL (CANCER CENTER ONLY)
Abs Immature Granulocytes: 0.02 K/uL (ref 0.00–0.07)
Basophils Absolute: 0.1 K/uL (ref 0.0–0.1)
Basophils Relative: 2 %
Eosinophils Absolute: 0.1 K/uL (ref 0.0–0.5)
Eosinophils Relative: 3 %
HCT: 42 % (ref 36.0–46.0)
Hemoglobin: 13.6 g/dL (ref 12.0–15.0)
Immature Granulocytes: 1 %
Lymphocytes Relative: 19 %
Lymphs Abs: 0.8 K/uL (ref 0.7–4.0)
MCH: 30.1 pg (ref 26.0–34.0)
MCHC: 32.4 g/dL (ref 30.0–36.0)
MCV: 92.9 fL (ref 80.0–100.0)
Monocytes Absolute: 0.4 K/uL (ref 0.1–1.0)
Monocytes Relative: 10 %
Neutro Abs: 2.8 K/uL (ref 1.7–7.7)
Neutrophils Relative %: 65 %
Platelet Count: 339 K/uL (ref 150–400)
RBC: 4.52 MIL/uL (ref 3.87–5.11)
RDW: 14.3 % (ref 11.5–15.5)
WBC Count: 4.2 K/uL (ref 4.0–10.5)
nRBC: 0 % (ref 0.0–0.2)

## 2024-04-24 LAB — CMP (CANCER CENTER ONLY)
ALT: 14 U/L (ref 0–44)
AST: 20 U/L (ref 15–41)
Albumin: 4.4 g/dL (ref 3.5–5.0)
Alkaline Phosphatase: 71 U/L (ref 38–126)
Anion gap: 4 — ABNORMAL LOW (ref 5–15)
BUN: 16 mg/dL (ref 8–23)
CO2: 29 mmol/L (ref 22–32)
Calcium: 9.1 mg/dL (ref 8.9–10.3)
Chloride: 108 mmol/L (ref 98–111)
Creatinine: 0.9 mg/dL (ref 0.44–1.00)
GFR, Estimated: >60 mL/min (ref >60)
Glucose, Bld: 101 mg/dL — ABNORMAL HIGH (ref 70–99)
Potassium: 4.4 mmol/L (ref 3.5–5.1)
Sodium: 141 mmol/L (ref 135–145)
Total Bilirubin: 0.7 mg/dL (ref 0.0–1.2)
Total Protein: 7 g/dL (ref 6.5–8.1)

## 2024-04-24 LAB — LACTATE DEHYDROGENASE: LDH: 216 U/L — ABNORMAL HIGH (ref 98–192)

## 2024-04-24 NOTE — Progress Notes (Signed)
 [facility] Telephone:([phone]   [phone]  OFFICE PROGRESS NOTE  Dayna Motto, DO [address] [place] [address]  DIAGNOSIS: Essential Thrombocythemia. [name] 2 mutation positive.    PRIOR THERAPY: None   CURRENT THERAPY: 1) Hydroxyurea  1000 mg alternating with 500 mg every other day . First dose on 07/03/20 started as 500 mg p.o. daily. 2) Integra plus  1 tablet p.o. daily  INTERVAL HISTORY: Ruth Sharp 73 y.o. female returns to the clinic today for follow-up visit.Discussed the use of AI scribe software for clinical note transcription with the patient, who gave verbal consent to proceed.  History of Present Illness Ruth Sharp is a 73 year old female with essential thrombocythemia and iron deficiency anemia who presents for evaluation and repeat blood work.  She has essential thrombocythemia with a positive [name]2 mutation and is currently on hydroxyurea , taking 1000 mg on one day and 500 mg on the alternate day. She feels 'okay' with no new complaints since her last visit three months ago. No chest pain, breathing issues, bleeding, or bruising. She mentions feeling 'regular tired' but does not report significant fatigue.  She also has iron deficiency anemia for which she is taking Integra Plus , one capsule every other day.     MEDICAL HISTORY: Past Medical History:  Diagnosis Date   Arthritis    Tachycardia     ALLERGIES:  is allergic to keflex [cephalexin] and tape.  MEDICATIONS:  Current Outpatient Medications  Medication Sig Dispense Refill   atorvastatin  (LIPITOR) 20 MG tablet TAKE 1 TABLET BY MOUTH EVERY DAY 30 tablet 3   Bacillus Coagulans-Inulin (PROBIOTIC) 1-250 BILLION-MG CAPS Take 1 capsule by mouth daily. Pt states uses Arloa Prior brand     buPROPion (WELLBUTRIN) 75 MG tablet Take 75 mg by mouth 2 (two) times daily.     Calcium  Carbonate-Vitamin D (CALTRATE 600+D PO) Take 1 tablet by mouth daily.      FeFum-FePoly-FA-B Cmp-C-Biot (FOLIVANE-PLUS) CAPS TAKE ONE CAPSULE BY MOUTH ONCE DAILY 90 capsule 0   hydroxychloroquine  (PLAQUENIL ) 200 MG tablet Take 2 tablets (400 mg total) by mouth daily. 90 tablet 3   hydroxyurea  (HYDREA ) 500 MG capsule TAKE 1 CAPSULE BY MOUTH EVERY OTHER DAY. ALTERNATING TAKE 2 CAPSULES AS DIRECTED. 135 capsule 1   ibuprofen (ADVIL) 200 MG tablet Take 1 tablet by mouth daily as needed.     metoprolol  tartrate (LOPRESSOR ) 50 MG tablet TAKE 1 AND 1/2 TABLETS BY MOUTH TWICE A DAY 270 tablet 2   Multiple Vitamins-Minerals (CENTRUM SILVER) CHEW Chew 1 each by mouth daily.     sertraline (ZOLOFT) 50 MG tablet Take 100 mg by mouth daily.     TURMERIC PO Take by mouth.     TYRVAYA 0.03 MG/ACT SOLN Place 1 spray into both nostrils 2 (two) times daily. Per opthamology     No current facility-administered medications for this visit.    SURGICAL HISTORY:  Past Surgical History:  Procedure Laterality Date   ABDOMINAL HYSTERECTOMY     BREAST EXCISIONAL BIOPSY Right    benign   knee replacements      REVIEW OF SYSTEMS:  A comprehensive review of systems was negative.   PHYSICAL EXAMINATION: General appearance: alert, cooperative, and no distress Head: Normocephalic, without obvious abnormality, atraumatic Neck: no adenopathy, no JVD, supple, symmetrical, trachea midline, and thyroid  not enlarged, symmetric, no tenderness/mass/nodules Lymph nodes: Cervical, supraclavicular, and axillary nodes normal. Resp: clear to auscultation bilaterally Back: symmetric,  no curvature. ROM normal. No CVA tenderness. Cardio: regular rate and rhythm, S1, S2 normal, no murmur, click, rub or gallop GI: soft, non-tender; bowel sounds normal; no masses,  no organomegaly Extremities: extremities normal, atraumatic, no cyanosis or edema  ECOG PERFORMANCE STATUS: 0 - Asymptomatic  Blood pressure 114/72, pulse 62, temperature (!) 97.2 F (36.2 C), temperature source Temporal, resp. rate 16, height  5' 7 (1.702 m), weight 184 lb (83.5 kg), SpO2 97%.  LABORATORY DATA: Lab Results  Component Value Date   WBC 4.2 04/24/2024   HGB 13.6 04/24/2024   HCT 42.0 04/24/2024   MCV 92.9 04/24/2024   PLT 339 04/24/2024      Chemistry      Component Value Date/Time   NA 142 01/17/2024 1025   NA 142 04/20/2017 1033   K 4.2 01/17/2024 1025   CL 109 01/17/2024 1025   CO2 27 01/17/2024 1025   BUN 17 01/17/2024 1025   BUN 16 04/20/2017 1033   CREATININE 0.84 01/17/2024 1025      Component Value Date/Time   CALCIUM  8.6 (L) 01/17/2024 1025   ALKPHOS 71 01/17/2024 1025   AST 24 01/17/2024 1025   ALT 17 01/17/2024 1025   BILITOT 0.7 01/17/2024 1025       RADIOGRAPHIC STUDIES: No results found.   ASSESSMENT AND PLAN: This is a very pleasant 73 years old white female recently diagnosed with essential thrombocythemia as well as iron deficiency anemia.  The patient is currently on treatment with hydroxyurea  1000 mg p.o. alternating with 500 mg every other day in addition to Integra +1 capsule p.o. daily. The patient has been tolerating this treatment fairly well. Repeat CBC today showed no significant abnormalities. 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 She is on hydroxyurea  1000 mg alternating with 500 mg every other day. CBC shows a blood count of 339, indicating effective management. No new symptoms such as chest pain, dyspnea, bleeding, or bruising reported. - Continue hydroxyurea  1000 mg alternating with 500 mg every other day - Repeat blood work in three months  Iron deficiency anemia She is taking Integra Plus , one capsule every other day. Hemoglobin levels are stable with no new symptoms reported. - Continue Integra Plus , one capsule every other day The patient was advised to call immediately if she has any concerning symptoms in the interval  The patient voices  understanding of current disease status and treatment options and is in agreement with the current care plan.  All questions were answered. The patient knows to call the clinic with any problems, questions or concerns. We can certainly see the patient much sooner if necessary.  Disclaimer: This note was dictated with voice recognition software. Similar sounding words can inadvertently be transcribed and may not be corrected upon review.

## 2024-04-25 ENCOUNTER — Ambulatory Visit: Admitting: Psychology

## 2024-05-01 DIAGNOSIS — M25569 Pain in unspecified knee: Secondary | ICD-10-CM | POA: Diagnosis not present

## 2024-05-01 DIAGNOSIS — M545 Low back pain, unspecified: Secondary | ICD-10-CM | POA: Diagnosis not present

## 2024-05-01 DIAGNOSIS — M25579 Pain in unspecified ankle and joints of unspecified foot: Secondary | ICD-10-CM | POA: Diagnosis not present

## 2024-05-01 DIAGNOSIS — M542 Cervicalgia: Secondary | ICD-10-CM | POA: Diagnosis not present

## 2024-05-01 DIAGNOSIS — M25519 Pain in unspecified shoulder: Secondary | ICD-10-CM | POA: Diagnosis not present

## 2024-05-03 ENCOUNTER — Other Ambulatory Visit: Payer: Self-pay | Admitting: Cardiovascular Disease

## 2024-05-03 DIAGNOSIS — R002 Palpitations: Secondary | ICD-10-CM

## 2024-05-03 DIAGNOSIS — G4733 Obstructive sleep apnea (adult) (pediatric): Secondary | ICD-10-CM

## 2024-05-03 DIAGNOSIS — I341 Nonrheumatic mitral (valve) prolapse: Secondary | ICD-10-CM

## 2024-05-05 ENCOUNTER — Ambulatory Visit: Admitting: Psychology

## 2024-05-08 ENCOUNTER — Encounter: Payer: Self-pay | Admitting: Psychology

## 2024-05-08 ENCOUNTER — Encounter: Attending: Psychology

## 2024-05-08 DIAGNOSIS — R4189 Other symptoms and signs involving cognitive functions and awareness: Secondary | ICD-10-CM | POA: Diagnosis not present

## 2024-05-08 NOTE — Progress Notes (Unsigned)
 Mental Status/Behavioral Observations (05/08/2024):  Orientation: The patient was mostly oriented to self, place, and time (unable to provide exact name of medical organization). Sensory/Arousal: Hearing and vision were adequate for testing. The patient was alert. Appearance: Dress and hygiene were appropriate for the setting.  Speech/Language: In conversation, the patient's speech was prosodic, fluent, and well-articulated. The patient displayed no indications of word finding difficulties and no paraphasic errors were observed. There were no indications of problems with auditory-verbal comprehension based on the patient's behavior during testing.  Motor: The patient ambulated independently and without issue. No tremors were observed during testing, however the patient reported that she sometimes has a tremor on her left hand when carrying out other tasks.  Social Comportment: Social behavior was appropriate to the setting. Mood/Affect: Mood was neutral to positive. Affect was consistent with mood.  Attention/Concentration: The patient participated in testing as instructed. No frank attentional lapses were appreciated. Thought Process/Content: The patient's thought process was coherent, linear, goal directed. There were no indications of psychosis.  Ability to Participate in Testing / Additional Observations: No difficulties understanding task instructions.   Neuropsychology Note Ruth Sharp completed 120 minutes of neuropsychological testing with technician, Josue Ned, BA, under the supervision of Evalene Riff, PsyD., Clinical Neuropsychologist. The patient did not appear overtly distressed by the testing session, per behavioral observation or via self-report to the technician. Rest breaks were offered.   Clinical Decision Making: In considering the patient's current level of functioning, level of presumed impairment, nature of symptoms, emotional and behavioral responses during clinical  interview, level of literacy, and observed level of motivation/effort, a battery of tests was selected by Dr. Riff during initial consultation on 04/05/2024. This was communicated to the technician. Communication between the neuropsychologist and technician was ongoing throughout the testing session and changes were made as deemed necessary based on patient performance on testing, technician observations and additional pertinent factors such as those listed above.  Tests Administered: Automatic Data Edition (BNT-2) Boston Diagnostic Aphasia Examination- Third Edition (BDAE); select subtests Brief Visuospatial Memory Test-Revised (BVMT-R) Clock Drawing Test Controlled Oral Word Association Test (FAS & Animals) Delis-Kaplan Executive Function System (D-KEFS), select subtests Grooved Pegboard Test [employer] Verbal Learning Test - Revised (HVLT-R) Repeatable Battery for the Assessment of Neuropsychological Status Update (RBANS) Trail Making Test (TMT; Part A & B) Wechsler Adult Intelligence Scale-Fourth Edition (WAIS-IV), select subtests Wechsler Memory Scale-Fourth Edition (WMS-IV) , select subtests Wechsler Memory Scale-Third Edition (WMS-III), select subtests  Wechsler Test of Adult Reading (WTAR) Geriatric Depression Scale-Short Form (GDS-SF) Geriatric Anxiety Inventory (GAI)   Results: Note: This summary of test scores accompanies the interpretive report and should not be interpreted by unqualified individuals or in isolation without reference to the report. Test scores are relative to age, gender, and educational history as available and appropriate. Measurement properties of test scores: IQ, Index, and Standard Scores (SS): Mean = 100; Standard Deviation = 15; Scaled Scores (ss): Mean = 10; Standard Deviation = 3; Z scores (Z): Mean = 0; Standard Deviation = 1; T scores (T); Mean = 50; Standard Deviation = 10  Intellectual/Premorbid Functioning Estimate   Norm Score Percentile  Range  Wechsler Test of Adult Reading  SS = 126 96 %ile Above Average   ATTENTION AND WORKING MEMORY   Norm Score Percentile Range  WAIS-IV          Digit Span  ss = 11 63 %ile Average   DSF  ss = 10 50 %ile Average  Span:    6      DSB  ss = 11 63 %ile Average   Span:    6      DSS  ss = 10 50 %ile Average   Span:    5     WMS-III          Spatial Span  ss = 11 63 %ile Average   SSF  ss = 12 75 %ile High Average   Span:    6      SSB  ss = 10 50 %ile Average   Span:    4      PROCESSING SPEED    Norm Score Percentile  Range  WAIS-IV          Coding  ss = 13 84 %ile High Average   PSYCHOMOTION    Norm Score Percentile  Range  Grooved Pegboard - Dominant  t = 34 5 %ile Below Average   # of drops    0     Grooved Pegboard - Nondominant  t = 36 8 %ile Below Average   # of drops    1      LANGUAGE    Norm Score Percentile  Range  Boston Naming Test (BNT-2)  t = 46 32 %ile Average  COWAT          FAS  t = 47 37 %ile Average   Animals  t = 54 66 %ile Average   EXECUTIVE FUNCTIONING    Norm Score Percentile  Range  DKEFS - Color-Word Interference          Color Naming  ss = 8 25 %ile Average   Word Reading  ss = 11 63 %ile Average   Inhibition  ss = 12 75 %ile High Average   Errors  ss = 12 75 %ile High Average   Inhibition Switching  ss = 12 75 %ile High Average   Errors  ss = 9 37 %ile Average  Trails A  t = 47 37 %ile Average  Trails B  t = 54 66 %ile Average   MEMORY    Norm Score Percentile  Range  BVMT-R          Trial 1  t = 61.0 86 %ile High Average   Trial 2  t = 62 88 %ile High Average   Trial 3  t = 66.0 95 %ile Above Average   Total Recall  t = 65.0 94 %ile Above Average   Learning  t = 57.0 75 %ile High Average   Delayed Recall  t = 63.0 91 %ile Above Average   % Retained    92 >16 %ile WNL   Hits     >16 %ile WNL   False Alarms     >16 %ile WNL   Recognition Discriminability     >16 %ile WNL  HVLT          Total Recall  t = 60.0 84 %ile High Average    Delayed Recall  t = 60 84 %ile High Average   %Retention  t = 56.0 73 %ile Average   Recognition Discriminability  t = 47.0 37 %ile Average  Wechsler Memory Scale, 4th Edition (WMS-4)         Log. Mem. Immediate Recall  ss = 12 75 %ile High Average   Logical Memory Delayed Recall  ss = 12 75 %ile High Average   Logical Recognition  51st-75th  %ile Average   VISUAL-SPATIAL    Norm Score Percentile  Range  WAIS-IV          Block Design  ss = 11 63 %ile Average  Clock       WNL            RBANS Visuospatial Index          RBANS Figure Copy  ss = 9 37 %ile Average   RBANS Line Orientation      51st-75th %ile   Average to High Average   PERSONALITY AND BEHAVIORAL FUNCTIONING      Score/Interpretation  GDS-SF Raw       8  GDS-SF Severity       Mild.  GAI Raw       6  GAI Severity       Minimal.    Feedback to Patient: Ruth Sharp will return on 05/18/2024 for an interactive feedback session with Dr. Hayden at which time her test performances, clinical impressions and treatment recommendations will be reviewed in detail. The patient understands she can contact our office should she require our assistance before this time.  120 minutes spent face-to-face with patient administering standardized tests, 30 minutes spent scoring Radiographer, therapeutic). [CPT [id number], [id number]]  Full report to follow.

## 2024-05-09 DIAGNOSIS — M8589 Other specified disorders of bone density and structure, multiple sites: Secondary | ICD-10-CM | POA: Diagnosis not present

## 2024-05-09 DIAGNOSIS — R5383 Other fatigue: Secondary | ICD-10-CM | POA: Diagnosis not present

## 2024-05-09 DIAGNOSIS — E669 Obesity, unspecified: Secondary | ICD-10-CM | POA: Diagnosis not present

## 2024-05-09 DIAGNOSIS — Z6831 Body mass index (BMI) 31.0-31.9, adult: Secondary | ICD-10-CM | POA: Diagnosis not present

## 2024-05-09 DIAGNOSIS — M1991 Primary osteoarthritis, unspecified site: Secondary | ICD-10-CM | POA: Diagnosis not present

## 2024-05-09 DIAGNOSIS — M25471 Effusion, right ankle: Secondary | ICD-10-CM | POA: Diagnosis not present

## 2024-05-09 DIAGNOSIS — D473 Essential (hemorrhagic) thrombocythemia: Secondary | ICD-10-CM | POA: Diagnosis not present

## 2024-05-09 DIAGNOSIS — M0579 Rheumatoid arthritis with rheumatoid factor of multiple sites without organ or systems involvement: Secondary | ICD-10-CM | POA: Diagnosis not present

## 2024-05-14 DIAGNOSIS — F411 Generalized anxiety disorder: Secondary | ICD-10-CM | POA: Diagnosis not present

## 2024-05-14 DIAGNOSIS — E785 Hyperlipidemia, unspecified: Secondary | ICD-10-CM | POA: Diagnosis not present

## 2024-05-14 DIAGNOSIS — N1831 Chronic kidney disease, stage 3a: Secondary | ICD-10-CM | POA: Diagnosis not present

## 2024-05-14 DIAGNOSIS — F331 Major depressive disorder, recurrent, moderate: Secondary | ICD-10-CM | POA: Diagnosis not present

## 2024-05-15 ENCOUNTER — Telehealth: Admitting: Physician Assistant

## 2024-05-15 DIAGNOSIS — Z20822 Contact with and (suspected) exposure to covid-19: Secondary | ICD-10-CM | POA: Diagnosis not present

## 2024-05-15 MED ORDER — NIRMATRELVIR/RITONAVIR (PAXLOVID)TABLET: 3.0000 | ORAL_TABLET | Freq: Two times a day (BID) | ORAL | 0 refills | Status: AC

## 2024-05-15 NOTE — Patient Instructions (Signed)
 Ruth Sharp, thank you for joining Lynden GORMAN Snuffer, PA-C for today's virtual visit.  While this provider is not your primary care provider (PCP), if your PCP is located in our provider database this encounter information will be shared with them immediately following your visit.   A [facility] MyChart account gives you access to today's visit and all your visits, tests, and labs performed at [facility]  click here if you don't have a [facility] MyChart account or go to [email or web address]  Consent: (Patient) Ruth Sharp provided verbal consent for this virtual visit at the beginning of the encounter.  Current Medications:  Current Outpatient Medications:    atorvastatin  (LIPITOR) 20 MG tablet, TAKE 1 TABLET BY MOUTH EVERY DAY, Disp: 30 tablet, Rfl: 3   Bacillus Coagulans-Inulin (PROBIOTIC) 1-250 BILLION-MG CAPS, Take 1 capsule by mouth daily. Pt states uses [employer] brand, Disp: , Rfl:    buPROPion (WELLBUTRIN) 75 MG tablet, Take 75 mg by mouth 2 (two) times daily., Disp: , Rfl:    Calcium  Carbonate-Vitamin D (CALTRATE 600+D PO), Take 1 tablet by mouth daily., Disp: , Rfl:    FeFum-FePoly-FA-B Cmp-C-Biot (FOLIVANE-PLUS) CAPS, TAKE ONE CAPSULE BY MOUTH ONCE DAILY, Disp: 90 capsule, Rfl: 0   hydroxychloroquine  (PLAQUENIL ) 200 MG tablet, Take 2 tablets (400 mg total) by mouth daily., Disp: 90 tablet, Rfl: 3   hydroxyurea  (HYDREA ) 500 MG capsule, TAKE 1 CAPSULE BY MOUTH EVERY OTHER DAY. ALTERNATING TAKE 2 CAPSULES AS DIRECTED., Disp: 135 capsule, Rfl: 1   ibuprofen (ADVIL) 200 MG tablet, Take 1 tablet by mouth daily as needed., Disp: , Rfl:    metoprolol  tartrate (LOPRESSOR ) 50 MG tablet, TAKE 1 AND 1/2 TABLETS BY MOUTH TWICE A DAY, Disp: 120 tablet, Rfl: 0   Multiple Vitamins-Minerals (CENTRUM SILVER) CHEW, Chew 1 each by mouth daily., Disp: , Rfl:    sertraline (ZOLOFT) 50 MG tablet, Take 100 mg by mouth daily., Disp: , Rfl:    TURMERIC PO, Take by mouth., Disp:  , Rfl:    TYRVAYA 0.03 MG/ACT SOLN, Place 1 spray into both nostrils 2 (two) times daily. Per opthamology, Disp: , Rfl:    Medications ordered in this encounter:  No orders of the defined types were placed in this encounter.    *If you need refills on other medications prior to your next appointment, please contact your pharmacy*  Follow-Up: Call back or seek an in-person evaluation if the symptoms worsen or if the condition fails to improve as anticipated.  [facility] [phone]  Other Instructions Take paxlovid  as directed   Hold your atorvastatin  while on this medication. Your buprorion may not be as effective while on this medication.  Follow up with your regular doctor in 1 week for reassessment and seek care sooner if your symptoms worsen or fail to improve.    If you have been instructed to have an in-person evaluation today at a local Urgent Care facility, please use the link below. It will take you to a list of all of our available [facility], including address, phone number and hours of operation. Please do not delay care.  [facility]  If you or a family member do not have a primary care provider, use the link below to schedule a visit and establish care. When you choose a [facility] primary care physician or advanced practice provider, you gain a long-term partner in health. Find a Primary Care Provider  Learn more  about [facility]'s in-office and virtual care options: [facility] - Get Care Now

## 2024-05-15 NOTE — Progress Notes (Signed)
 Ms. Ruth, Sharp are scheduled for a virtual visit with your provider today.    Just as we do with appointments in the office, we must obtain your consent to participate.  Your consent will be active for this visit and any virtual visit you may have with one of our providers in the next 365 days.    If you have a MyChart account, I can also send a copy of this consent to you electronically.  All virtual visits are billed to your insurance company just like a traditional visit in the office.  As this is a virtual visit, video technology does not allow for your provider to perform a traditional examination.  This may limit your provider's ability to fully assess your condition.  If your provider identifies any concerns that need to be evaluated in person or the need to arrange testing such as labs, EKG, etc, we will make arrangements to do so.    Although advances in technology are sophisticated, we cannot ensure that it will always work on either your end or our end.  If the connection with a video visit is poor, we may have to switch to a telephone visit.  With either a video or telephone visit, we are not always able to ensure that we have a secure connection.   I need to obtain your verbal consent now.   Are you willing to proceed with your visit today?   Ruth Sharp has provided verbal consent on 05/15/2024 for a virtual visit (video or telephone).   Lynden GORMAN Snuffer, PA-C 05/15/2024  2:26 PM   Date:  05/15/2024   ID:  Ruth Sharp, DOB 08-16-1951, MRN [id number]  Patient Location: Home Provider Location: Home Office   Participants: Patient and Provider for Visit and Wrap up  Method of visit: Video  Location of Patient: Home Location of Provider: Home Office Consent was obtain for visit over the video. Services rendered by provider: Visit was performed via video  A video enabled telemedicine application was used and I verified that I am speaking with the correct person using two  identifiers.  PCP:  Dayna Motto, DO   Chief Complaint:  covid  History of Present Illness:    Ruth Sharp is a 73 y.o. female with history as stated below. Presents video telehealth for an acute care visit  Pt states she tested positive for Covid today. She has had symptoms for 2 days. She reports sore throat and cough. Reports chronic rhinorrhea. Denies fevers, chest pain, shortness of breath.   Past Medical, Surgical, Social History, Allergies, and Medications have been Reviewed.  Past Medical History:  Diagnosis Date   Arthritis    Tachycardia     Current Meds  Medication Sig   nirmatrelvir /ritonavir  (PAXLOVID ) 20 x 150 MG & 10 x 100MG  TABS Take 3 tablets by mouth 2 (two) times daily for 5 days.     Allergies:   Amoxicillin, Keflex [cephalexin], and Tape   ROS See HPI for history of present illness.  Physical Exam Constitutional:      Appearance: Normal appearance. She is not ill-appearing.  Pulmonary:     Effort: Pulmonary effort is normal.  Neurological:     Mental Status: She is alert.               MDM: Pt states she tested positive for covid, Sxs ongoing for 2 days. Will give rx for paxlovid . Advised on drug interactions and need to hold atorvastatin  while on  this medication    Tests Ordered: No orders of the defined types were placed in this encounter.   Medication Changes: Meds ordered this encounter  Medications   nirmatrelvir /ritonavir  (PAXLOVID ) 20 x 150 MG & 10 x 100MG  TABS    Sig: Take 3 tablets by mouth 2 (two) times daily for 5 days.    Dispense:  30 tablet    Refill:  0     Disposition:  Follow up  Signed, Ezrah Dembeck GORMAN Snuffer, PA-C  05/15/2024 2:26 PM

## 2024-05-16 ENCOUNTER — Encounter: Attending: Psychology | Admitting: Psychology

## 2024-05-16 DIAGNOSIS — R4189 Other symptoms and signs involving cognitive functions and awareness: Secondary | ICD-10-CM | POA: Insufficient documentation

## 2024-05-18 ENCOUNTER — Encounter: Admitting: Psychology

## 2024-05-26 ENCOUNTER — Encounter: Admitting: Psychology

## 2024-05-26 ENCOUNTER — Encounter (HOSPITAL_BASED_OUTPATIENT_CLINIC_OR_DEPARTMENT_OTHER): Admitting: Psychology

## 2024-05-26 DIAGNOSIS — R4189 Other symptoms and signs involving cognitive functions and awareness: Secondary | ICD-10-CM | POA: Diagnosis not present

## 2024-05-26 NOTE — Progress Notes (Signed)
 NEUROPSYCHOLOGICAL EVALUATION [place]. [facility]  Physical Medicine and Rehabilitation     Patient: Ruth Sharp  MRN: [id number] DOB: 1951/08/23  Age: 73 y.o. Sex: female  Race/Ethnicity: White or Caucasian  Years of Education: 18 Handedness: Left  Collateral Information Source: Sister  Referring Provider: True Mar, MD, PhD  Provider/Clinical Neuropsychologist: Evalene DOROTHA Riff, PsyD  Date of Service: 05/16/2024 Start Time: 11 AM End Time: 12 PM  Location of Service:  [facility] Physical Medicine & Rehabilitation Department [place]. [facility] [address] [place]. [address] [place], [place] [address] Phone: [phone]  Billing Code/Service: [phone]  Individuals Present: Evalene Riff, PsyD 1 hour was spent on interpretation of patient data, interpretation of standardized test results and clinical data, clinical decision making, initial treatment planning/recommendations, and report writing. The report will be amended as needed based on any additional information collected during interactive feedback session.   REASON FOR REFERRAL:The patient was referred for neuropsychological evaluation by her neurologist, Dr. Mar, due to concerns of cognitive difficulties involving word finding and short-term memory. The patient has a medical history of medical history of rheumatoid arthritis (on Plaquenil ), tachycardia, hyperlipidemia, thrombocytopenia, uterine cancer, anxiety, tremor, and mild and obesity. Referring provider records indicate patient reports increased forgetfulness and word finding difficulty over the prior two years. She has no known family history of Alzheimer's dementia or memory loss. She has been diagnosed with sleep apnea. At the time of the evaluation, she was awaiting receipt of cPAP/aPAP device. She is ultimately hoping to have Inspire implant due to difficulty tolerating the mask in the past. She has a history of depression and anxiety,  managed via medication. 10/15/2023 MRI reportedly showed mild chronic white matter changes and mild generalized volume loss. Records note the patient initially presented to neurology on 09/17/2022 with left hand tremor. Per records from that visit, tremor was not observed by her physician during that visit. She has family history of tremors in her mother. Neurological exam was reported to be non-focal per records from her 02/28/24 visit.    Upon interview, the patient and sister expressed interest in ongoing evaluation to determine what was underlying the cognitive changes  HISTORY OF PRESENTING CONCERNS:  Cognitive Symptom Onset & Course: Cognitive changes began approximately five years ago per patient report.  Her sister first noticed changes around two years ago. Course was described as progressive and onset was gradual.   Current Cognitive Complaints:  Memory: Per patient and collateral, the patient has some difficulty remembering conversations and recent events. She needs to write things down to track medical appointments consistently. One instance of getting lost while driving and unable to remember how to get home, though this was not a frequently visited location. Denies misplacing things more often. Sister denies noticing patient repeating herself unintentionally  Processing Speed: Slower processing speed noted by both patient and sister, worsened over the last year. Issue relatively consistent over time with significant day-to-day variability. Able to complete tasks in roughly same amount of time as usual. No problems keeping up with subtitles when watching TV.  Attention & Concentration: No significant changes noted other than sometimes losing train of thought, possibly related to word finding difficulties interrupting thought process. Sister did not notice significant changes in attention and concentration  Language: Difficulty with word finding. Sister did not notice paraphasic errors. Per  sister, the patient appears to struggle with following complex discussions, or plans involving multiple steps. Some interference may potentially be related to hearing loss/hearing  difficulties. Feels difficulty pronouncing words or that speech is slowed. Denies problems with reading or writing composition. Slightly slower with mental arithmetic.  Visual-Spatial: Denies clear indications of difficulty with visual spatial abilities  Executive Functioning: Some long-running tendencies towards indecision but no major difficulties with decision-making, problem-solving, planning, or organization. Denies significant behavioral changes involving impulsivity or major changes with respect to frustration tolerance  Motor/Sensory Complaints:   Sensory changes: Declines in sense of smell since COVID infection around two years ago.  Also endorsed declines in sense of taste. Has hearing aids for about three years, starting to wear them more frequently.  No significant difficulties with vision. Balance/coordination difficulties: Difficulties with balance, has had three falls with last being about a year ago. Falls occurred within a few months of each other. Frequent dizziness/vertigo: Denies problems with dizziness or vertigo Other motor difficulties: Per patient report, essential tremor for last three years, most prominent when holding leash and walking dog/in action. Tremor is worse in dominant left hand although reported to be present in her right hand as well. History of tremor reported in her mother.   Emotional and Behavioral Functioning:  Patient endorsed a long history of depression starting in childhood.  She has been on medication for mood possibly dating to about 20 years ago.  She has never been involved in individual therapy.  She reported a remote history of suicidal ideation but no history of attempts or psychiatric admissions. She had some medication adjustments around two years ago which she found  beneficial.  Depression: Denies currently being in depressive episode. Denies frequent feelings of sadness, feelings of guilt, hopelessness, or anhedonia.  No suicidal ideation. Anxiety: No clear indications of generalized anxiety or physiological symptoms of anxiety Other: No history of hallucination, paranoia, symptoms of mania, or apathy. No indications of PTSD.   Sleep: Indicated she may get about 7 hours of sleep per night on a good night.  Little trouble falling asleep, can usually return to sleep after waking during night. Sleep study completed, diagnosed with sleep apnea. Currently waiting to receive machine, intends to meet compliance requirements then proceed with Inspire device implant. Does not feel rested in morning and has had difficulties for number of years Appetite: Minor variability but no significant weight loss or gain Caffeine: Relatively limited consumption Alcohol Use: Relatively limited consumption Tobacco Use: Never consumed Recreational Substance Use: Never consumed  Level of Functional Independence: The patient is intact with basic activities of daily living.  Finances: Intact Shopping / Meal Preparation: Intact Household Maintenance / Chores: Therapist, nutritional / Future Obligations: Intact but described increased reliance on reminders and notes. Medication Management: Intact with the use of pillbox. Driving: Intact although limits driving at night due to vision difficulties.  Medical History/Record Review:  History of traumatic brain injury/concussion: No History of stroke: No History of heart attack: No History of cancer/chemotherapy: No chemotherapy or radiation of the head History of seizure activity: No Symptoms of chronic pain: Very mild chronic pain in neck, about one out of 10 in severity Experience of frequent headaches/migraines: No  Past Medical History:  Diagnosis Date   Arthritis    Tachycardia    Patient Active Problem List    Diagnosis Date Noted   Iron deficiency anemia 08/13/2020   Abdominal cramping 07/16/2020   Essential thrombocythemia (HCC) 07/03/2020   Thrombocytosis 06/11/2020   Palpitations 05/28/2020   Hyperlipidemia 05/28/2020   Mitral valve prolapse 04/20/2017   Obstructive sleep apnea 04/20/2017   Tachycardia  Imaging/Lab Results:  Lab work reportedly normal per records.   10/15/23 EXAM: MRI HEAD WITHOUT CONTRAST FINDINGS: Brain: Mild generalized cerebral volume loss. Multifocal T2 FLAIR hyperintense signal abnormality within the cerebral white matter, nonspecific but compatible with mild chronic small vessel ischemic disease. Prominent perivascular spaces throughout the bilateral cerebral white matter. There is no acute infarct. No evidence of an intracranial mass. No chronic intracranial blood products. No extra-axial fluid collection. No midline shift. Vascular: Maintained flow voids within the proximal large arterial vessels. Skull and upper cervical spine: No focal worrisome marrow lesion. Sinuses/Orbits: No mass or acute finding within the imaged orbits. Prior bilateral ocular lens replacement. No significant paranasal sinus disease. IMPRESSION: 1. No evidence of an acute intracranial abnormality. 2. Mild chronic small vessel ischemic changes within the cerebral white matter. 3. Mild generalized cerebral volume loss.   Family Neurologic/Medical Hx: None Family History  Problem Relation Age of Onset   Heart failure Mother    Tremor Mother    Parkinson's disease Neg Hx    Alzheimer's disease Neg Hx    Dementia Neg Hx    Medications:  atorvastatin  (LIPITOR) 20 MG tablet Bacillus Coagulans-Inulin (PROBIOTIC) 1-250 BILLION-MG CAPS buPROPion (WELLBUTRIN) 75 MG tablet Calcium  Carbonate-Vitamin D (CALTRATE 600+D PO) FeFum-FePoly-FA-B Cmp-C-Biot (FOLIVANE-PLUS) CAPS hydroxychloroquine  (PLAQUENIL ) 200 MG tablet hydroxyurea  (HYDREA ) 500 MG capsule ibuprofen (ADVIL) 200  MG tablet metoprolol  tartrate (LOPRESSOR ) 50 MG tablet Multiple Vitamins-Minerals (CENTRUM SILVER) CHEW sertraline (ZOLOFT) 50 MG tablet TURMERIC PO TYRVAYA 0.03 MG/ACT SOLN   Academic/Vocational History:Master's degree in textiles.  Typically earned B's.  No learning difficulties or indications of attention or behavioral issues growing up. 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 Divorced 2018 Children/Grandchildren: One adult child.  Living Situation: Lives alone with her dogs.    NEUROPSYCHODIAGNOSTIC FINDINGS: Mental Status/Behavioral Observations (05/08/2024):  Orientation: The patient was oriented to self, time, and most aspects of place (oriented to city but not facility/medical organization name) Sensory/Arousal: Hearing and vision were adequate for testing. The patient was alert. Appearance: Dress and hygiene were appropriate for the setting.  Speech/Language: In conversation, the patient's speech was prosodic, fluent, and well-articulated. The patient displayed no indications of word finding difficulties and no paraphasic errors were observed. There were no indications of problems with auditory-verbal comprehension based on the patient's behavior during testing.  Motor: The patient ambulated independently and without issue. No tremors were observed during testing, however the patient reported that she sometimes has a tremor on her left hand when carrying out other tasks.  Social Comportment: Social behavior was appropriate to the setting. Mood/Affect: Mood was neutral to positive. Affect was consistent with mood.  Attention/Concentration: The patient participated in testing as instructed. No frank attentional lapses were appreciated. Thought Process/Content: The patient's thought process was coherent, linear, goal directed. There were no indications of psychosis.   Ability to Participate in Testing / Additional Observations: No difficulties understanding task instructions.  Tests Administered: Automatic Data Edition (BNT-2) Boston Diagnostic Aphasia Examination- Third Edition (BDAE); select subtests Brief Visuospatial Memory Test-Revised (BVMT-R) Clock Drawing Test Controlled Oral Word Association Test (FAS & Animals) Delis-Kaplan Executive Function System (D-KEFS), select subtests Grooved Pegboard Test Hopkins Verbal Learning Test - Revised (HVLT-R) Repeatable Battery for the Assessment of Neuropsychological Status Update (RBANS) Trail Making Test (TMT; Part A & B) Wechsler Adult Intelligence Scale-Fourth Edition (WAIS-IV), select subtests Wechsler Memory Scale-Fourth Edition (  WMS-IV) , select subtests Wechsler Memory Scale-Third Edition (WMS-III), select subtests  Wechsler Test of Adult Reading (WTAR) Geriatric Depression Scale-Short Form (GDS-SF) Geriatric Anxiety Inventory (GAI)  Results: Test scores are relative to age and, when possible, age+education. Measurement properties of test scores: IQ, Index, and Standard Scores (SS): Mean = 100; Standard Deviation = 15; Scaled Scores (ss): Mean = 10; Standard Deviation = 3; Z scores (Z): Mean = 0; Standard Deviation = 1; T scores (T); Mean = 50; Standard Deviation = 10  Intellectual/Premorbid Functioning Estimate   Norm Score Percentile Range  Wechsler Test of Adult Reading  SS = 126 96 %ile Above Average  Based on the patient's performance on a word-reading/recognition measure and patient history, the patient's premorbid cognitive abilities are estimated to be around the high average range.   ATTENTION AND WORKING MEMORY   Norm Score Percentile Range  WAIS-IV          Digit Span  ss = 11 63 %ile Average   DSF  ss = 10 50 %ile Average   Span:    6      DSB  ss = 11 63 %ile Average   Span:    6      DSS  ss = 10 50 %ile Average   Span:    5     WMS-III          Spatial Span  ss =  11 63 %ile Average   SSF  ss = 12 75 %ile High Average   Span:    6      SSB  ss = 10 50 %ile Average   Span:    4     Overall performance in auditory-verbal attention was average. She scored consistently between subtests of basic span and working memory.  Visual attention was scored average overall. Subtest performances were high average and average, but the difference was not statistically significant.   PROCESSING SPEED    Norm Score Percentile  Range  WAIS-IV          Coding  ss = 13 84 %ile High Average  Processing speed as measured by rapid digit symbol translation was high average by age.  PSYCHOMOTION    Norm Score Percentile  Range  Grooved Pegboard - Dominant  t = 34 5 %ile Below Average   # of drops    0     Grooved Pegboard - Nondominant  t = 36 8 %ile Below Average   # of drops    1      Motor Praxis        WNL   Speeded motor dexterity was below average bilaterally.  There is was no significant lateralization.   LANGUAGE    Norm Score Percentile  Range  Boston Naming Test (BNT-2)  t = 46 32 %ile Average  COWAT          FAS  t = 47 37 %ile Average   Animals  t = 54 66 %ile Average   BDAE          Complex Ideational Material      12/12  WNL  Sentence Repetition       10/10  WNL  Measures of language functioning were normed by combined age and education.  She scored within the average range on confrontation naming/word retrieval.  She scored average for phonemic (letter) verbal fluency and average for semantic (category) verbal fluency. She scored slightly better on the semantic  verbal fluency task relative to phonemic, but it was not a statistically significant difference.   She made no errors and had no difficulty on a verbal comprehension task with complex ideational material and she made no errors with sentence repetition.   EXECUTIVE FUNCTIONING    Norm Score Percentile  Range  DKEFS - Color-Word Interference          Color Naming  ss = 8 25 %ile Average   Word  Reading  ss = 11 63 %ile Average   Inhibition  ss = 12 75 %ile High Average   Errors  ss = 12 75 %ile High Average   Inhibition Switching  ss = 12 75 %ile High Average   Errors  ss = 9 37 %ile Average  Trails A  t = 47 37 %ile Average  Trails B  t = 54 66 %ile Average  On baseline metrics the patient scored within the average range for both rapid color naming and rapid color word reading.  Her speed and accuracy were high average by age on the basic response inhibition task.  She had slightly more errors on the combination response inhibition/set shifting task, scoring in the average range for accuracy, but was high average in speed.  Basic sequencing speed with and without set shifting was average by age and education.   MEMORY    Norm Score Percentile  Range  BVMT-R          Trial 1  t = 61.0 86 %ile High Average   Trial 2  t = 62 88 %ile High Average   Trial 3  t = 66.0 95 %ile Above Average   Total Recall  t = 65.0 94 %ile Above Average   Learning  t = 57.0 75 %ile High Average   Delayed Recall  t = 63.0 91 %ile Above Average   % Retained    92 >16 %ile WNL   Hits     >16 %ile WNL   False Alarms     >16 %ile WNL   Recognition Discriminability     >16 %ile WNL  HVLT          Total Recall  t = 60.0 84 %ile High Average   Delayed Recall  t = 60 84 %ile High Average   %Retention (100%) t = 56.0 73 %ile Average   Recognition Discriminability  t = 47.0 37 %ile Average  Wechsler Memory Scale, 4th Edition (WMS-4)         Log. Mem. Immediate Recall  ss = 12 75 %ile High Average   Logical Memory Delayed Recall  ss = 12 75 %ile High Average   Logical Recognition    51st-75th  %ile Average  The patient completed one visually based memory test into auditory-verbal memory tests.  On the visually based memory test her performances were high average to above average for the individual learning trials and above average for total information produced across those trials.  Her learning performance,  relative gains from repetition, was high average and qualitatively she showed a typical learning curve with improvements upon repetition.  She scored in the above average range for delayed free recall and she scored within normal limits for percent retention, having retained 92% of the information she initially encoded.  She scored within normal limits on all metrics of the delayed recognition task.   On an auditory-verbal memory test involving a word list the patient scored within the high average range  for total words across the three learning trials.  Her delayed free recall performance was within the high average range as well.  She retained all information she initially encoded. She scored within the average range on the delayed recognition task, correctly identifying all target words but having to semantically related false positives.  On the auditory-verbal memory test involving two short stories she scored within the high average range for both immediate and delayed free recall. She was average on the delayed recognition task.    VISUAL-SPATIAL    Norm Score Percentile  Range  WAIS-IV          Block Design  ss = 11 63 %ile Average  Clock       WNL  RBANS Visuospatial Index          RBANS Figure Copy  ss = 9 37 %ile Average   RBANS Line Orientation      51st-75th   %ile Average to High Average  The patient's performance on a timed visual construction task requiring her to replicate a two-dimensional figure using three-dimensional blocks was scored in the average range by age.  She had no difficulty with a visual construction task involving copy of a geometric figure and was average to high average and adjustment of line orientation task.  PERSONALITY AND BEHAVIORAL FUNCTIONING      Score/Interpretation  GDS-SF Raw       8  GDS-SF Severity       Mild.  GAI Raw       6  GAI Severity       Minimal.  Endorses on self-report measures generated a score in the mild range for depression related  symptoms and in the subclinical range for anxiety related symptoms.   SUMMARY / CLINICAL IMPRESSIONS The patient was referred for neuropsychological evaluation by her neurologist, Dr. Buck, due to concerns of cognitive difficulties involving word finding and short-term memory. Referring provider records indicate patient reports increased forgetfulness over the prior two years. She has no known family history of Alzheimer's dementia or memory loss. She has been diagnosed with sleep apnea. At the time of the evaluation, she was awaiting receipt of cPAP/aPAP device. She is ultimately hoping to have Inspire implant due to difficulty tolerating the mask in the past. She has a history of depression and anxiety, managed via medication. 10/15/2023 MRI reportedly showed mild chronic white matter changes and mild generalized volume loss. Records note the patient initially presented to neurology on 09/17/2022 with left hand tremor. She has family history of tremors in her mother. Neurological exam was reported to be non-focal per records from her 02/28/24 visit. Upon interview, the patient and sister expressed interest in ongoing evaluation to determine what was underlying the cognitive changes.  Per patient and collateral report during interview, cognitive symptoms were gradual in onset and progressive. Symptoms became noticeable to the patient around five years ago. Her sister reported noticing changes around two years ago. Difficulties involve short term memory, possible slowed processing, and difficulties tracking conversation when content complexity increases. She describes experiences of tremor primarily in her left (dominant) hand. No significant difficulties with attention, visual spatial abilities, or executive functioning reported. She described feeling less steady in terms of balance. She has hearing loss, which may have impacted auditory receptive speech. She is wearing her hearing aids (obtained 3 years ago)  more frequently now. She is functionally intact with both basic and instrumental activities of daily living. No reports of transient AMS or RBDs.  She has a history of depression, but denies significant symptoms at present. Sleep difficulties and OSA notable and a potential factor in her cognitive symptoms.   The patient's cognitive test profile showed no evidence of broad based cognitive impairment and no compelling evidence of cognitive decline. She had no clear difficulties within measures of processing speed, attention, and no clear declines in working memory. All measures of language functioning were consistent with age and education and auditory-verbal comprehension is intact. She showed no clear impairments within the domain of executive functioning. Her scores on memory testing were quite strong and consistent across modalities. Visual spatial test performances showed no notable deficits. Psychiatric symptoms on self-report measures showed some mild depressive symptoms, but minimal indications of anxiety.   The results of the current evaluation are reassuring overall. Test score show no compelling evidence of neurocognitive impairment or indications of unidentified neurodegenerative pathology at this time. It is possible that her average range performances in some areas are reflecting declines. This cannot be asserted with confidence in the absence of premorbid test data for comparison. A re-evaluation could be conducted in ~12 months if desired by the patient and compared to the current evaluation if this remains a significant concern. However, individuals have normal variability in their cognitive profile and the consistency of scores (no indications of working-memory specific declines) remains reassuring. Intact performances on testing suggest intact neurological functioning, but it does not preclude having cognitive difficulties in day to day life. Variable sleep quality, stress, difficulties with  mood/anxiety are a few examples of things which can interfere with ones' ability to apply their cognitive abilities in day to day life. At this point, a formal neurocognitive disorder diagnosis is not indicated given the patient's test performances and indications of essentially intact independent functioning overall. Again, a future re-evaluation can be conducted if desired.   Diagnosis: Cognitive Changes   Recommendations:              Evalene DOROTHA Riff, PsyD             Neuropsychologist This report was generated using voice recognition software. While this document has been carefully reviewed, transcription errors may be present. I apologize in advance for any inconvenience. Please contact me if further clarification is needed.

## 2024-05-27 NOTE — Progress Notes (Signed)
   NEUROPSYCHOLOGICAL EVALUATION [place]. [facility]  Physical Medicine and Rehabilitation     Patient: Ruth Sharp  MRN: [id number] DOB: [birth year]   Service Provider/Clinical Neuropsychologist: Evalene DOROTHA Riff, PsyD  Date of Service: 05/26/24 Start Time: 3 PM End Time: 4 PM  Location of Service:  [facility] Physical Medicine & Rehabilitation Department [place]. [facility] [address] [place]. [address] [place], [place] [address] Phone: [phone]   Billing Code/Service: [id number]    Individuals present: Patient, patient's sister (with patient's permission), Provider Laurier DOROTHA Riff, PsyD)  Provider conducted the 60-minute interactive feedback appointment in-person with the patient and patient's sister.  The provider reviewed and discussed the results of neuropsychological evaluation. Follow-up interviewing was conducted as needed to refine interpretation of findings as needed. Review of results included overall findings, diagnosis, and treatment planning/recommendations that were derived from integration of patient data, interpretation of standardized rest results and clinical data, and clinical decision making, which are documented in the patient's electronic medical record with the full report (date listed below). A copy of the full report will also be mailed to the patient.   The patient expressed understanding of the information reviewed. The patient was provided opportunity to ask questions which were then answered by the provider. The provider worked collaboratively to tailor treatment recommendations to the patient when possible. The patient was informed they could reach out to the provider should additional questions related to the evaluation arise.    The final neuropsychological evaluation report, documented in the patient's chart (DATE 05/16/24), was amended to reflect any additional information obtained during the feedback appointment including treatment  planning collaboration.               Evalene DOROTHA Riff, PsyD             Neuropsychologist   This report was generated using voice recognition software. While this document has been carefully reviewed, transcription errors may be present. I apologize in advance for any inconvenience. Please contact me if further clarification is needed.

## 2024-05-29 DIAGNOSIS — M25519 Pain in unspecified shoulder: Secondary | ICD-10-CM | POA: Diagnosis not present

## 2024-05-29 DIAGNOSIS — M25579 Pain in unspecified ankle and joints of unspecified foot: Secondary | ICD-10-CM | POA: Diagnosis not present

## 2024-05-29 DIAGNOSIS — M542 Cervicalgia: Secondary | ICD-10-CM | POA: Diagnosis not present

## 2024-05-29 DIAGNOSIS — M545 Low back pain, unspecified: Secondary | ICD-10-CM | POA: Diagnosis not present

## 2024-05-29 DIAGNOSIS — M25569 Pain in unspecified knee: Secondary | ICD-10-CM | POA: Diagnosis not present

## 2024-06-05 DIAGNOSIS — M545 Low back pain, unspecified: Secondary | ICD-10-CM | POA: Diagnosis not present

## 2024-06-05 DIAGNOSIS — M25579 Pain in unspecified ankle and joints of unspecified foot: Secondary | ICD-10-CM | POA: Diagnosis not present

## 2024-06-05 DIAGNOSIS — M25519 Pain in unspecified shoulder: Secondary | ICD-10-CM | POA: Diagnosis not present

## 2024-06-05 DIAGNOSIS — M542 Cervicalgia: Secondary | ICD-10-CM | POA: Diagnosis not present

## 2024-06-05 DIAGNOSIS — M25569 Pain in unspecified knee: Secondary | ICD-10-CM | POA: Diagnosis not present

## 2024-06-07 DIAGNOSIS — Z008 Encounter for other general examination: Secondary | ICD-10-CM | POA: Diagnosis not present

## 2024-06-09 ENCOUNTER — Other Ambulatory Visit: Payer: Self-pay | Admitting: Cardiovascular Disease

## 2024-06-09 DIAGNOSIS — I341 Nonrheumatic mitral (valve) prolapse: Secondary | ICD-10-CM

## 2024-06-09 DIAGNOSIS — R002 Palpitations: Secondary | ICD-10-CM

## 2024-06-09 DIAGNOSIS — G4733 Obstructive sleep apnea (adult) (pediatric): Secondary | ICD-10-CM

## 2024-06-12 DIAGNOSIS — Z961 Presence of intraocular lens: Secondary | ICD-10-CM | POA: Diagnosis not present

## 2024-06-12 DIAGNOSIS — H0102B Squamous blepharitis left eye, upper and lower eyelids: Secondary | ICD-10-CM | POA: Diagnosis not present

## 2024-06-12 DIAGNOSIS — H16223 Keratoconjunctivitis sicca, not specified as Sjogren's, bilateral: Secondary | ICD-10-CM | POA: Diagnosis not present

## 2024-06-12 DIAGNOSIS — Z79899 Other long term (current) drug therapy: Secondary | ICD-10-CM | POA: Diagnosis not present

## 2024-06-12 NOTE — Patient Instructions (Incomplete)
 Please continue using your CPAP regularly. While your insurance requires that you use CPAP at least 4 hours each night on 70% of the nights, I recommend, that you not skip any nights and use it throughout the night if you can. Getting used to CPAP and staying with the treatment long term does take time and patience and discipline. Untreated obstructive sleep apnea when it is moderate to severe can have an adverse impact on cardiovascular health and raise her risk for heart disease, arrhythmias, hypertension, congestive heart failure, stroke and diabetes. Untreated obstructive sleep apnea causes sleep disruption, nonrestorative sleep, and sleep deprivation. This can have an impact on your day to day functioning and cause daytime sleepiness and impairment of cognitive function, memory loss, mood disturbance, and problems focussing. Using CPAP regularly can improve these symptoms.  Management of Memory Problems   There are some general things you can do to help manage your memory problems.  Your memory may not in fact recover, but by using techniques and strategies you will be able to manage your memory difficulties better.   1)  Establish a routine. Try to establish and then stick to a regular routine.  By doing this, you will get used to what to expect and you will reduce the need to rely on your memory.  Also, try to do things at the same time of day, such as taking your medication or checking your calendar first thing in the morning. Think about think that you can do as a part of a regular routine and make a list.  Then enter them into a daily planner to remind you.  This will help you establish a routine.   2)  Organize your environment. Organize your environment so that it is uncluttered.  Decrease visual stimulation.  Place everyday items such as keys or cell phone in the same place every day (ie.  Basket next to front door) Use post it notes with a brief message to yourself (ie. Turn off light, lock  the door) Use labels to indicate where things go (ie. Which cupboards are for food, dishes, etc.) Keep a notepad and pen by the telephone to take messages   3)  Memory Aids A diary or journal/notebook/daily planner Making a list (shopping list, chore list, to do list that needs to be done) Using an alarm as a reminder (kitchen timer or cell phone alarm) Using cell phone to store information (Notes, Calendar, Reminders) Calendar/White board placed in a prominent position Post-it notes   In order for memory aids to be useful, you need to have good habits.  It's no good remembering to make a note in your journal if you don't remember to look in it.  Try setting aside a certain time of day to look in journal.   4)  Improving mood and managing fatigue. There may be other factors that contribute to memory difficulties.  Factors, such as anxiety, depression and tiredness can affect memory. Regular gentle exercise can help improve your mood and give you more energy. Exercise: there are short videos created by the [employer] on Health specially for older adults: https://bit.ly/2I30q97.  Mediterranean diet: which emphasizes fruits, vegetables, whole grains, legumes, fish, and other seafood; unsaturated fats such as olive oils; and low amounts of red meat, eggs, and sweets. A variation of this, called MIND [facility] Intervention for Neurodegenerative Delay) incorporates the DASH (Dietary Approaches to Stop Hypertension) diet, which has been shown to lower high blood pressure, a risk factor for  Alzheimer's disease. More information at: ExitMarketing.de.  Aerobic exercise that improve heart health is also good for the mind.  [employer] on Aging have short videos for exercises that you can do at home: BlindWorkshop.com.pt Simple relaxation techniques may help relieve symptoms of anxiety Try to get back to  completing activities or hobbies you enjoyed doing in the past. Learn to pace yourself through activities to decrease fatigue. Find out about some local support groups where you can share experiences with others. Try and achieve 7-8 hours of sleep at night.   Tasks to improve attention/working memory 1. Good sleep hygiene (7-8 hrs of sleep) 2. Learning a new skill (Painting, Carpentry, Pottery, new language, Knitting). 3.Cognitive exercises (keep a daily journal, Puzzles) 4. Physical exercise and training  (30 min/day X 4 days week) 5. Being on Antidepressant if needed 6.Yoga, Meditation, Tai Chi 7. Decrease alcohol intake 8.Have a clear schedule and structure in daily routine   MIND Diet: The Mediterranean-DASH Diet Intervention for Neurodegenerative Delay, or MIND diet, targets the health of the aging brain. Research participants with the highest MIND diet scores had a significantly slower rate of cognitive decline compared with those with the lowest scores. The effects of the MIND diet on cognition showed greater effects than either the Mediterranean or the DASH diet alone.   The healthy items the MIND diet guidelines suggest include:   3+ servings a day of whole grains 1+ servings a day of vegetables (other than green leafy) 6+ servings a week of green leafy vegetables 5+ servings a week of nuts 4+ meals a week of beans 2+ servings a week of berries 2+ meals a week of poultry 1+ meals a week of fish Mainly olive oil if added fat is used   The unhealthy items, which are higher in saturated and trans fat, include: Less than 5 servings a week of pastries and sweets Less than 4 servings a week of red meat (including beef, pork, lamb, and products made from these meats) Less than one serving a week of cheese and fried foods Less than 1 tablespoon a day of butter/stick margarine

## 2024-06-12 NOTE — Progress Notes (Deleted)
 PATIENT: Ruth Sharp DOB: 02-15-1951  REASON FOR VISIT: follow up HISTORY FROM: patient  No chief complaint on file.    HISTORY OF PRESENT ILLNESS:  06/12/24 ALL:  MEERA VASCO is a 73 y.o. female here today for follow up for recently diagnosed OSA started on CPAP. She was seen in consult with Dr Buck 02/2024 for memory loss. Neurocognitive evaluation recommended and referral placed. HST advised and showed overall mild obstructive sleep apnea by number of events but more significant by oxygen criteria. Her total AHI was 13.2/h, utilizing the 4% desaturation criteria for obstructive hypopneas per Medicare guidelines. Average oxygen saturation was only 90%, nadir was 75% with significant time below or at 88% saturation of over 40 minutes for the night, indicating nocturnal hypoxemia. AutoPAP advised. Since,   Memory?    HISTORY: (copied from Dr Obie previous note)  Dear Dr. Dayna,   I saw your patient, Ruth Sharp, upon your kind request in my neurologic clinic today for evaluation of her memory loss.  The patient is unaccompanied today.  As you know, Ruth Sharp is a 73 year old female with an underlying medical history of rheumatoid arthritis (on Plaquenil ), tachycardia, hyperlipidemia, thrombocytopenia, uterine cancer, anxiety, tremor, and mild obesity, who reports an approximately 2-year history of forgetfulness.  She has word finding difficulty and name recall issues, does not report a family history of Alzheimer's dementia or memory loss, mom lived to be in her 49s and died suddenly, dad died from cancer and was about 59.  She has 1 younger sister who does not have any memory issues.  She denies any strokelike symptoms, no headaches, no morning headaches, sleeps a little bit better, would be willing to pursue a home sleep test at this time.  She has nocturia about once or twice per average night.  She still has residual anxiety but is better with buspirone, takes sertraline  150 mg daily, not sure if she takes Wellbutrin still.    She limits her caffeine, usually has 1 soda at lunch, tries to hydrate well, estimates that she drinks at least 48 ounces of water per day, rare alcohol, she is a non-smoker.  She denies any confusion while driving but a few weeks ago she had a short episode of confusion at home.  She has not left anything on the stove.     I had evaluated her last year for a tremor.  She reported forgetfulness at the time.  We talked about her sleep disturbance as well and she was advised to proceed with sleep testing.  She did not pursue the home sleep test.    I reviewed your office note from 09/22/2023.  She reported difficulty with name recall and word finding difficulty.  She had depressive symptoms at the time and was started on buspirone.  She had blood work on 09/22/2023 and I have reviewed the results in her paper chart: CMP showed potassium mildly elevated at 5.7, otherwise normal findings. I reviewed your office note from 11/12/2023.  Her BuSpar was increased to 10 mg twice daily.   She had a brain MRI without contrast on 10/15/2023 and I reviewed the results:     IMPRESSION: 1. No evidence of an acute intracranial abnormality. 2. Mild chronic small vessel ischemic changes within the cerebral white matter. 3. Mild generalized cerebral volume loss.     In addition, I personally and independently reviewed images through the PACS system.   Previously:   09/17/2022: Ruth Sharp is a  73 year old LH female with an underlying medical history of arthritis, hyperlipidemia, PACs, tachycardia, thrombocythemia, history of uterine cancer, anxiety, and mildly overweight state, who reports a several week/few month history of primarily left hand tremors noticeable when she is walking.  Symptoms are particularly noticeable when she holds the leash for her dogs with her left hand.  She does not really have a right hand tremor, has not noticed a tremor with holding  anything else such as a cup or with handwriting.  1 time she noticed a slight tremor in her left hand when she was holding a measuring cup while baking.  She has not had any major impairment in her day-to-day function but got concerned about the tremor.  She feels that it is more consistent now whenever she holds the leash.  She has not had any pain.  She has not noticed any tremor in her lower extremities or upper body.  She has a family history of tremors affecting her mom, she had a tremor in her hands and her handwriting got worse when she was older.  Mom had very good handwriting before.  Patient reports that her own handwriting has not changed.  She has never been very good at cursive writing.  She is without any major balance issues, has a history of arthritis, she has fallen recently.  She also reports some forgetfulness.  She has not had a physical and recent blood work through your office.  She is agreeable to scheduling a full physical.  As far as triggers, she has not really noticed any obvious triggers.  She drinks caffeine in limitation in the form of soda at lunch, usually 1 serving.  She may not hydrate all that well every day, depends on how thirsty she feels.  She drinks alcohol occasionally, she is a non-smoker.  She is divorced and lives alone, 2 dogs in the household, she has 1 grown son, age 36 without any obvious history of tremors.  She is retired.  She had both knees replaced, she had a tonsillectomy as a child.  Of note, she does not sleep well, she reports sleep disruption and nocturia about twice per average night, no recurrent morning headaches but has had the occasional pressure sensation.  She had a sleep study some 10 years ago and was diagnosed with sleep apnea and was prescribed a CPAP machine but never got around to getting a machine.  She would be willing to get reevaluated.  I reviewed your office note from 08/04/2022.  She was noted to have a mild postural tremor on exam.   Recent blood work was not included in the referral, she had blood work through oncology on 07/20/2022 which showed a benign CBC with differential, elevated LDH at 223, and normal CMP.   REVIEW OF SYSTEMS: Out of a complete 14 system review of symptoms, the patient complains only of the following symptoms, and all other reviewed systems are negative.  ESS:  ALLERGIES: Allergies  Allergen Reactions   Amoxicillin Diarrhea    Pt said she cannot take Amoxicillin  due to diarrhea   Keflex [Cephalexin] Rash   Tape Rash    HOME MEDICATIONS: Outpatient Medications Prior to Visit  Medication Sig Dispense Refill   atorvastatin  (LIPITOR) 20 MG tablet TAKE 1 TABLET BY MOUTH EVERY DAY 30 tablet 3   Bacillus Coagulans-Inulin (PROBIOTIC) 1-250 BILLION-MG CAPS Take 1 capsule by mouth daily. Pt states uses Arloa Prior brand     buPROPion (WELLBUTRIN) 75 MG tablet Take  75 mg by mouth 2 (two) times daily.     Calcium  Carbonate-Vitamin D (CALTRATE 600+D PO) Take 1 tablet by mouth daily.     FeFum-FePoly-FA-B Cmp-C-Biot (FOLIVANE-PLUS) CAPS TAKE ONE CAPSULE BY MOUTH ONCE DAILY 90 capsule 0   hydroxychloroquine  (PLAQUENIL ) 200 MG tablet Take 2 tablets (400 mg total) by mouth daily. 90 tablet 3   hydroxyurea  (HYDREA ) 500 MG capsule TAKE 1 CAPSULE BY MOUTH EVERY OTHER DAY. ALTERNATING TAKE 2 CAPSULES AS DIRECTED. 135 capsule 1   ibuprofen (ADVIL) 200 MG tablet Take 1 tablet by mouth daily as needed.     metoprolol  tartrate (LOPRESSOR ) 50 MG tablet TAKE 1 AND 1/2 TABLETS BY MOUTH TWICE A DAY 15 tablet 0   Multiple Vitamins-Minerals (CENTRUM SILVER) CHEW Chew 1 each by mouth daily.     sertraline (ZOLOFT) 50 MG tablet Take 100 mg by mouth daily.     TURMERIC PO Take by mouth.     TYRVAYA 0.03 MG/ACT SOLN Place 1 spray into both nostrils 2 (two) times daily. Per opthamology     No facility-administered medications prior to visit.    PAST MEDICAL HISTORY: Past Medical History:  Diagnosis Date   Arthritis     Tachycardia     PAST SURGICAL HISTORY: Past Surgical History:  Procedure Laterality Date   ABDOMINAL HYSTERECTOMY     BREAST EXCISIONAL BIOPSY Right    benign   knee replacements      FAMILY HISTORY: Family History  Problem Relation Age of Onset   Heart failure Mother    Tremor Mother    Parkinson's disease Neg Hx    Alzheimer's disease Neg Hx    Dementia Neg Hx     SOCIAL HISTORY: Social History   Socioeconomic History   Marital status: Divorced    Spouse name: Not on file   Number of children: Not on file   Years of education: Not on file   Highest education level: Not on file  Occupational History   Not on file  Tobacco Use   Smoking status: Never   Smokeless tobacco: Never  Vaping Use   Vaping status: Never Used  Substance and Sexual Activity   Alcohol use: Yes    Comment: occasionally   Drug use: Never   Sexual activity: Not on file  Other Topics Concern   Not on file  Social History Narrative   Pt lives alone    Retired    Social Drivers of Corporate investment banker Strain: Not on file  Food Insecurity: Not on file  Transportation Needs: Not on file  Physical Activity: Not on file  Stress: Not on file  Social Connections: Not on file  Intimate Partner Violence: Not on file     PHYSICAL EXAM  There were no vitals filed for this visit. There is no height or weight on file to calculate BMI.  Generalized: Well developed, in no acute distress  Cardiology: normal rate and rhythm, no murmur noted Respiratory: clear to auscultation bilaterally  Neurological examination  Mentation: Alert oriented to time, place, history taking. Follows all commands speech and language fluent Cranial nerve II-XII: Pupils were equal round reactive to light. Extraocular movements were full, visual field were full  Motor: The motor testing reveals 5 over 5 strength of all 4 extremities. Good symmetric motor tone is noted throughout.  Gait and station: Gait is  normal.    DIAGNOSTIC DATA (LABS, IMAGING, TESTING) - I reviewed patient records, labs, notes, testing and imaging  myself where available.      No data to display           Lab Results  Component Value Date   WBC 4.2 04/24/2024   HGB 13.6 04/24/2024   HCT 42.0 04/24/2024   MCV 92.9 04/24/2024   PLT 339 04/24/2024      Component Value Date/Time   NA 141 04/24/2024 1120   NA 142 04/20/2017 1033   K 4.4 04/24/2024 1120   CL 108 04/24/2024 1120   CO2 29 04/24/2024 1120   GLUCOSE 101 (H) 04/24/2024 1120   BUN 16 04/24/2024 1120   BUN 16 04/20/2017 1033   CREATININE 0.90 04/24/2024 1120   CALCIUM  9.1 04/24/2024 1120   PROT 7.0 04/24/2024 1120   PROT 6.6 01/13/2023 0953   ALBUMIN 4.4 04/24/2024 1120   ALBUMIN 4.5 01/13/2023 0953   AST 20 04/24/2024 1120   ALT 14 04/24/2024 1120   ALKPHOS 71 04/24/2024 1120   BILITOT 0.7 04/24/2024 1120   GFRNONAA >60 04/24/2024 1120   GFRAA 57 (L) 06/11/2020 1251   Lab Results  Component Value Date   CHOL 159 01/13/2023   HDL 93 01/13/2023   LDLCALC 53 01/13/2023   TRIG 66 01/13/2023   CHOLHDL 1.7 01/13/2023   Lab Results  Component Value Date   HGBA1C 5.9 (H) 04/20/2017   Lab Results  Component Value Date   VITAMINB12 582 02/28/2024   Lab Results  Component Value Date   TSH 2.400 02/28/2024       No data to display              No data to display           ASSESSMENT AND PLAN 72 y.o. year old female  has a past medical history of Arthritis and Tachycardia. here with   No diagnosis found.    Ruth Sharp is doing well on CPAP therapy. Compliance report reveals ***. *** was encouraged to continue using CPAP nightly and for greater than 4 hours each night. We will update supply orders as indicated. Risks of untreated sleep apnea review and education materials provided. Healthy lifestyle habits encouraged. *** will follow up in ***, sooner if needed. *** verbalizes understanding and agreement with this plan.     No orders of the defined types were placed in this encounter.    No orders of the defined types were placed in this encounter.     Greig Forbes, FNP-C 06/12/2024, 4:31 PM [facility] [address], [address] [place], [place] [address] [phone]

## 2024-06-13 ENCOUNTER — Telehealth: Payer: Self-pay | Admitting: *Deleted

## 2024-06-13 DIAGNOSIS — F331 Major depressive disorder, recurrent, moderate: Secondary | ICD-10-CM | POA: Diagnosis not present

## 2024-06-13 DIAGNOSIS — F411 Generalized anxiety disorder: Secondary | ICD-10-CM | POA: Diagnosis not present

## 2024-06-13 DIAGNOSIS — M542 Cervicalgia: Secondary | ICD-10-CM | POA: Diagnosis not present

## 2024-06-13 DIAGNOSIS — M545 Low back pain, unspecified: Secondary | ICD-10-CM | POA: Diagnosis not present

## 2024-06-13 DIAGNOSIS — M25579 Pain in unspecified ankle and joints of unspecified foot: Secondary | ICD-10-CM | POA: Diagnosis not present

## 2024-06-13 DIAGNOSIS — E785 Hyperlipidemia, unspecified: Secondary | ICD-10-CM | POA: Diagnosis not present

## 2024-06-13 DIAGNOSIS — M25519 Pain in unspecified shoulder: Secondary | ICD-10-CM | POA: Diagnosis not present

## 2024-06-13 DIAGNOSIS — M25569 Pain in unspecified knee: Secondary | ICD-10-CM | POA: Diagnosis not present

## 2024-06-13 DIAGNOSIS — N1831 Chronic kidney disease, stage 3a: Secondary | ICD-10-CM | POA: Diagnosis not present

## 2024-06-13 NOTE — Telephone Encounter (Signed)
 I called pt back to discuss the below. Pt said she never heard from DME company [facility]. Pt received sleep study results, I see in chart documentation order was sent to DME. Per Amy cancel visit and pt can reach out to DME company.   I called and explained this visit for tomorrow is canceled and to reach out to DME # given to patient [phone]. Pt verbalized she understood.

## 2024-06-13 NOTE — Telephone Encounter (Signed)
 I called and left detailed message on voicemail asking pt to bring cpap machine along with powercord to visit on 06/14/24.

## 2024-06-13 NOTE — Telephone Encounter (Signed)
 Pt called back stating she doesn't have Cpap Machine  , Asked pt 2 times

## 2024-06-14 ENCOUNTER — Encounter: Admitting: Family Medicine

## 2024-06-14 ENCOUNTER — Telehealth: Payer: Self-pay | Admitting: Neurology

## 2024-06-14 DIAGNOSIS — G4734 Idiopathic sleep related nonobstructive alveolar hypoventilation: Secondary | ICD-10-CM

## 2024-06-14 DIAGNOSIS — R6889 Other general symptoms and signs: Secondary | ICD-10-CM

## 2024-06-14 DIAGNOSIS — G4733 Obstructive sleep apnea (adult) (pediatric): Secondary | ICD-10-CM

## 2024-06-14 DIAGNOSIS — R4789 Other speech disturbances: Secondary | ICD-10-CM

## 2024-06-14 NOTE — Telephone Encounter (Signed)
 Pt reporting that the Rx for her CPAP needs to be sent to DME

## 2024-06-14 NOTE — Telephone Encounter (Signed)
 Message sent to [facility] new autopap user.(High priority).

## 2024-06-15 NOTE — Telephone Encounter (Signed)
 RE: new autopap user Received: Today Zott, Glade Salt, Nena RAMAN, RN; Lavon Milling; Darrel Boyer Got It Thank you       Previous Messages    ----- Message ----- From: Salt Nena RAMAN, RN Sent: 06/14/2024   1:36 PM EDT To: Milling Donnice Boyer Darrel; Glade Zott Subject: new autopap user                              Hi This pt has new order in epic.  Needs autopap (new user)  Ruth MATSU. Sharp Female, 73 y.o., 1951-06-07 MRN: [id number] Thank you  [facility]

## 2024-06-16 ENCOUNTER — Telehealth: Payer: Self-pay | Admitting: Cardiovascular Disease

## 2024-06-16 DIAGNOSIS — G4733 Obstructive sleep apnea (adult) (pediatric): Secondary | ICD-10-CM

## 2024-06-16 DIAGNOSIS — I341 Nonrheumatic mitral (valve) prolapse: Secondary | ICD-10-CM

## 2024-06-16 DIAGNOSIS — R002 Palpitations: Secondary | ICD-10-CM

## 2024-06-16 MED ORDER — METOPROLOL TARTRATE 50 MG PO TABS: 75.0000 mg | ORAL_TABLET | Freq: Two times a day (BID) | ORAL | 0 refills | Status: DC

## 2024-06-16 NOTE — Telephone Encounter (Signed)
*  STAT* If patient is at the pharmacy, call can be transferred to refill team.   1. Which medications need to be refilled? (please list name of each medication and dose if known)   metoprolol  tartrate (LOPRESSOR ) 50 MG tablet     2. Would you like to learn more about the convenience, safety, & potential cost savings by using the [facility]? NO    3. Are you open to using the [facility] (Type [facility]. No    4. Which pharmacy/location (including street and city if local pharmacy) is medication to be sent to? [id number] - [place], [place] - [address]     5. Do they need a 30 day or 90 day supply? 90 day   Pt has annual f/u scheduled 06/20/24.

## 2024-06-16 NOTE — Telephone Encounter (Signed)
 RX sent in

## 2024-06-18 ENCOUNTER — Other Ambulatory Visit: Payer: Self-pay | Admitting: Cardiovascular Disease

## 2024-06-19 DIAGNOSIS — M542 Cervicalgia: Secondary | ICD-10-CM | POA: Diagnosis not present

## 2024-06-19 DIAGNOSIS — M25569 Pain in unspecified knee: Secondary | ICD-10-CM | POA: Diagnosis not present

## 2024-06-19 DIAGNOSIS — M25579 Pain in unspecified ankle and joints of unspecified foot: Secondary | ICD-10-CM | POA: Diagnosis not present

## 2024-06-19 DIAGNOSIS — M25519 Pain in unspecified shoulder: Secondary | ICD-10-CM | POA: Diagnosis not present

## 2024-06-19 DIAGNOSIS — M545 Low back pain, unspecified: Secondary | ICD-10-CM | POA: Diagnosis not present

## 2024-06-20 ENCOUNTER — Encounter (HOSPITAL_BASED_OUTPATIENT_CLINIC_OR_DEPARTMENT_OTHER): Payer: Self-pay | Admitting: *Deleted

## 2024-06-20 ENCOUNTER — Encounter: Payer: Self-pay | Admitting: Cardiovascular Disease

## 2024-06-20 ENCOUNTER — Ambulatory Visit: Attending: Cardiovascular Disease | Admitting: Cardiovascular Disease

## 2024-06-20 VITALS — BP 110/68 | HR 63 | Ht 67.5 in | Wt 185.0 lb

## 2024-06-20 DIAGNOSIS — G4733 Obstructive sleep apnea (adult) (pediatric): Secondary | ICD-10-CM | POA: Diagnosis not present

## 2024-06-20 DIAGNOSIS — R002 Palpitations: Secondary | ICD-10-CM | POA: Diagnosis not present

## 2024-06-20 DIAGNOSIS — I341 Nonrheumatic mitral (valve) prolapse: Secondary | ICD-10-CM | POA: Diagnosis not present

## 2024-06-20 DIAGNOSIS — E782 Mixed hyperlipidemia: Secondary | ICD-10-CM | POA: Diagnosis not present

## 2024-06-20 MED ORDER — ATORVASTATIN CALCIUM 20 MG PO TABS: 20.0000 mg | ORAL_TABLET | Freq: Every day | ORAL | 4 refills | Status: AC

## 2024-06-20 MED ORDER — METOPROLOL TARTRATE 50 MG PO TABS: 75.0000 mg | ORAL_TABLET | Freq: Two times a day (BID) | ORAL | 4 refills | Status: AC

## 2024-06-20 NOTE — Assessment & Plan Note (Signed)
 2D echo performed 05/04/2017 did not show mitral valve prolapse.

## 2024-06-20 NOTE — Assessment & Plan Note (Signed)
 History of mitral valve prolapse currently not on CPAP

## 2024-06-20 NOTE — Assessment & Plan Note (Signed)
 Resolved

## 2024-06-20 NOTE — Assessment & Plan Note (Signed)
 History of hyperlipidemia on atorvastatin  lipid profile performed 01/13/2023 revealing total cholesterol 159, LDL 53 and HDL 93.

## 2024-06-20 NOTE — Patient Instructions (Signed)

## 2024-06-20 NOTE — Progress Notes (Signed)
 06/20/2024 Ruth Sharp   Jun 17, 1951  [id number]  Primary Physician Dayna Motto, DO Primary Cardiologist: Dorn JINNY Lesches MD FACP, [place], [place], [place]  HPI:  Ruth Sharp is a 73 y.o.   separated, mother of one child with no grandchildren who worked in a gym in the past and recently relocated from [place]  down to [place] to be closer to family. She had been seeing cardiology group in [place].  I last saw her in the office 01/13/2023.  She has a history of palpitations thought to be related to PACs on event monitor on low-dose beta blocker she had a 2D echo performed 05/04/2017 that revealed normal LV systolic function, grade 2 diastolic dysfunction and mild MR without prolapse.  She did have symptoms of obstructive sleep apnea as well, and apparently had a positive sleep study but has not pursued CPAP.   I saw her a year ago she did well.  She denies chest pain or shortness of breath.  She no longer complains of palpitations.   Current Meds  Medication Sig   Bacillus Coagulans-Inulin (PROBIOTIC) 1-250 BILLION-MG CAPS Take 1 capsule by mouth daily. Pt states uses Arloa Prior brand   Calcium  Carbonate-Vitamin D (CALTRATE 600+D PO) Take 1 tablet by mouth daily.   hydroxychloroquine  (PLAQUENIL ) 200 MG tablet Take 2 tablets (400 mg total) by mouth daily.   hydroxyurea  (HYDREA ) 500 MG capsule TAKE 1 CAPSULE BY MOUTH EVERY OTHER DAY. ALTERNATING TAKE 2 CAPSULES AS DIRECTED.   Multiple Vitamins-Minerals (CENTRUM SILVER) CHEW Chew 1 each by mouth daily.   sertraline (ZOLOFT) 50 MG tablet Take 100 mg by mouth daily.   TURMERIC PO Take by mouth.   TYRVAYA 0.03 MG/ACT SOLN Place 1 spray into both nostrils 2 (two) times daily. Per opthamology   [DISCONTINUED] atorvastatin  (LIPITOR) 20 MG tablet TAKE 1 TABLET BY MOUTH EVERY DAY   [DISCONTINUED] metoprolol  tartrate (LOPRESSOR ) 50 MG tablet Take 1.5 tablets (75 mg total) by mouth 2 (two) times daily.     Allergies  Allergen  Reactions   Amoxicillin Diarrhea    Pt said she cannot take Amoxicillin  due to diarrhea   Keflex [Cephalexin] Rash   Tape Rash    Social History   Socioeconomic History   Marital status: Divorced    Spouse name: Not on file   Number of children: Not on file   Years of education: Not on file   Highest education level: Not on file  Occupational History   Not on file  Tobacco Use   Smoking status: Never   Smokeless tobacco: Never  Vaping Use   Vaping status: Never Used  Substance and Sexual Activity   Alcohol use: Yes    Comment: occasionally   Drug use: Never   Sexual activity: Not on file  Other Topics Concern   Not on file  Social History Narrative   Pt lives alone    Retired    Social Drivers of Corporate investment banker Strain: Not on file  Food Insecurity: Not on file  Transportation Needs: Not on file  Physical Activity: Not on file  Stress: Not on file  Social Connections: Not on file  Intimate Partner Violence: Not on file     Review of Systems: General: negative for chills, fever, night sweats or weight changes.  Cardiovascular: negative for chest pain, dyspnea on exertion, edema, orthopnea, palpitations, paroxysmal nocturnal dyspnea or shortness of breath Dermatological: negative for rash Respiratory: negative for cough or  wheezing Urologic: negative for hematuria Abdominal: negative for nausea, vomiting, diarrhea, bright red blood per rectum, melena, or hematemesis Neurologic: negative for visual changes, syncope, or dizziness All other systems reviewed and are otherwise negative except as noted above.    Blood pressure 110/68, pulse 63, height 5' 7.5 (1.715 m), weight 185 lb (83.9 kg), SpO2 97%.  General appearance: alert and no distress Neck: no adenopathy, no carotid bruit, no JVD, supple, symmetrical, trachea midline, and thyroid  not enlarged, symmetric, no tenderness/mass/nodules Lungs: clear to auscultation bilaterally Heart: regular rate  and rhythm, S1, S2 normal, no murmur, click, rub or gallop Extremities: extremities normal, atraumatic, no cyanosis or edema Pulses: 2+ and symmetric Skin: Skin color, texture, turgor normal. No rashes or lesions Neurologic: Grossly normal  EKG EKG Interpretation Date/Time:  Tuesday June 20 2024 16:02:36 EDT Ventricular Rate:  63 PR Interval:  176 QRS Duration:  80 QT Interval:  438 QTC Calculation: 448 R Axis:   -3  Text Interpretation: Normal sinus rhythm Minimal voltage criteria for LVH, may be normal variant ( R in aVL ) No previous ECGs available Confirmed by Court Carrier [phone]) on 06/20/2024 4:09:51 PM    ASSESSMENT AND PLAN:   Obstructive sleep apnea History of mitral valve prolapse currently not on CPAP  Palpitations Resolved  Hyperlipidemia History of hyperlipidemia on atorvastatin  lipid profile performed 01/13/2023 revealing total cholesterol 159, LDL 53 and HDL 93.  Mitral valve prolapse 2D echo performed 05/04/2017 did not show mitral valve prolapse.     Carrier DOROTHA Court MD FACP,FACC,FAHA, [facility] 06/20/2024 4:18 PM

## 2024-06-26 DIAGNOSIS — M25569 Pain in unspecified knee: Secondary | ICD-10-CM | POA: Diagnosis not present

## 2024-06-26 DIAGNOSIS — M542 Cervicalgia: Secondary | ICD-10-CM | POA: Diagnosis not present

## 2024-06-26 DIAGNOSIS — M25579 Pain in unspecified ankle and joints of unspecified foot: Secondary | ICD-10-CM | POA: Diagnosis not present

## 2024-06-26 DIAGNOSIS — M545 Low back pain, unspecified: Secondary | ICD-10-CM | POA: Diagnosis not present

## 2024-06-26 DIAGNOSIS — M25519 Pain in unspecified shoulder: Secondary | ICD-10-CM | POA: Diagnosis not present

## 2024-06-28 DIAGNOSIS — F341 Dysthymic disorder: Secondary | ICD-10-CM | POA: Diagnosis not present

## 2024-06-29 DIAGNOSIS — N1831 Chronic kidney disease, stage 3a: Secondary | ICD-10-CM | POA: Diagnosis not present

## 2024-06-29 DIAGNOSIS — Z1159 Encounter for screening for other viral diseases: Secondary | ICD-10-CM | POA: Diagnosis not present

## 2024-06-29 DIAGNOSIS — E785 Hyperlipidemia, unspecified: Secondary | ICD-10-CM | POA: Diagnosis not present

## 2024-07-03 DIAGNOSIS — M542 Cervicalgia: Secondary | ICD-10-CM | POA: Diagnosis not present

## 2024-07-03 DIAGNOSIS — M25569 Pain in unspecified knee: Secondary | ICD-10-CM | POA: Diagnosis not present

## 2024-07-03 DIAGNOSIS — M545 Low back pain, unspecified: Secondary | ICD-10-CM | POA: Diagnosis not present

## 2024-07-03 DIAGNOSIS — M25519 Pain in unspecified shoulder: Secondary | ICD-10-CM | POA: Diagnosis not present

## 2024-07-03 DIAGNOSIS — M25579 Pain in unspecified ankle and joints of unspecified foot: Secondary | ICD-10-CM | POA: Diagnosis not present

## 2024-07-10 DIAGNOSIS — M25569 Pain in unspecified knee: Secondary | ICD-10-CM | POA: Diagnosis not present

## 2024-07-10 DIAGNOSIS — M545 Low back pain, unspecified: Secondary | ICD-10-CM | POA: Diagnosis not present

## 2024-07-10 DIAGNOSIS — M25519 Pain in unspecified shoulder: Secondary | ICD-10-CM | POA: Diagnosis not present

## 2024-07-10 DIAGNOSIS — M25579 Pain in unspecified ankle and joints of unspecified foot: Secondary | ICD-10-CM | POA: Diagnosis not present

## 2024-07-10 DIAGNOSIS — M542 Cervicalgia: Secondary | ICD-10-CM | POA: Diagnosis not present

## 2024-07-14 DIAGNOSIS — F411 Generalized anxiety disorder: Secondary | ICD-10-CM | POA: Diagnosis not present

## 2024-07-14 DIAGNOSIS — N1831 Chronic kidney disease, stage 3a: Secondary | ICD-10-CM | POA: Diagnosis not present

## 2024-07-14 DIAGNOSIS — F331 Major depressive disorder, recurrent, moderate: Secondary | ICD-10-CM | POA: Diagnosis not present

## 2024-07-14 DIAGNOSIS — E785 Hyperlipidemia, unspecified: Secondary | ICD-10-CM | POA: Diagnosis not present

## 2024-07-14 DIAGNOSIS — F341 Dysthymic disorder: Secondary | ICD-10-CM | POA: Diagnosis not present

## 2024-07-24 DIAGNOSIS — M545 Low back pain, unspecified: Secondary | ICD-10-CM | POA: Diagnosis not present

## 2024-07-24 DIAGNOSIS — M25579 Pain in unspecified ankle and joints of unspecified foot: Secondary | ICD-10-CM | POA: Diagnosis not present

## 2024-07-24 DIAGNOSIS — M25519 Pain in unspecified shoulder: Secondary | ICD-10-CM | POA: Diagnosis not present

## 2024-07-24 DIAGNOSIS — M25569 Pain in unspecified knee: Secondary | ICD-10-CM | POA: Diagnosis not present

## 2024-07-24 DIAGNOSIS — M542 Cervicalgia: Secondary | ICD-10-CM | POA: Diagnosis not present

## 2024-07-29 ENCOUNTER — Other Ambulatory Visit: Payer: Self-pay | Admitting: Physician Assistant

## 2024-07-29 DIAGNOSIS — G4733 Obstructive sleep apnea (adult) (pediatric): Secondary | ICD-10-CM | POA: Diagnosis not present

## 2024-07-29 DIAGNOSIS — D473 Essential (hemorrhagic) thrombocythemia: Secondary | ICD-10-CM

## 2024-07-29 DIAGNOSIS — F341 Dysthymic disorder: Secondary | ICD-10-CM | POA: Diagnosis not present

## 2024-08-02 ENCOUNTER — Telehealth: Payer: Self-pay | Admitting: Internal Medicine

## 2024-08-02 NOTE — Telephone Encounter (Signed)
 Scheduled patient for next appointment. Called and left a voicemail with the details.

## 2024-08-03 DIAGNOSIS — J019 Acute sinusitis, unspecified: Secondary | ICD-10-CM | POA: Diagnosis not present

## 2024-08-07 DIAGNOSIS — M25579 Pain in unspecified ankle and joints of unspecified foot: Secondary | ICD-10-CM | POA: Diagnosis not present

## 2024-08-07 DIAGNOSIS — M25519 Pain in unspecified shoulder: Secondary | ICD-10-CM | POA: Diagnosis not present

## 2024-08-07 DIAGNOSIS — M25569 Pain in unspecified knee: Secondary | ICD-10-CM | POA: Diagnosis not present

## 2024-08-07 DIAGNOSIS — M542 Cervicalgia: Secondary | ICD-10-CM | POA: Diagnosis not present

## 2024-08-07 DIAGNOSIS — M545 Low back pain, unspecified: Secondary | ICD-10-CM | POA: Diagnosis not present

## 2024-08-09 ENCOUNTER — Inpatient Hospital Stay

## 2024-08-09 ENCOUNTER — Inpatient Hospital Stay: Admitting: Internal Medicine

## 2024-08-13 DIAGNOSIS — N1831 Chronic kidney disease, stage 3a: Secondary | ICD-10-CM | POA: Diagnosis not present

## 2024-08-13 DIAGNOSIS — F331 Major depressive disorder, recurrent, moderate: Secondary | ICD-10-CM | POA: Diagnosis not present

## 2024-08-13 DIAGNOSIS — E785 Hyperlipidemia, unspecified: Secondary | ICD-10-CM | POA: Diagnosis not present

## 2024-08-13 DIAGNOSIS — F411 Generalized anxiety disorder: Secondary | ICD-10-CM | POA: Diagnosis not present

## 2024-08-14 ENCOUNTER — Inpatient Hospital Stay: Attending: Internal Medicine

## 2024-08-14 ENCOUNTER — Inpatient Hospital Stay: Admitting: Internal Medicine

## 2024-08-14 VITALS — BP 112/76 | HR 69 | Temp 97.2°F | Resp 17 | Ht 67.5 in | Wt 184.0 lb

## 2024-08-14 DIAGNOSIS — Z79899 Other long term (current) drug therapy: Secondary | ICD-10-CM | POA: Insufficient documentation

## 2024-08-14 DIAGNOSIS — M545 Low back pain, unspecified: Secondary | ICD-10-CM | POA: Diagnosis not present

## 2024-08-14 DIAGNOSIS — D473 Essential (hemorrhagic) thrombocythemia: Secondary | ICD-10-CM

## 2024-08-14 DIAGNOSIS — M25579 Pain in unspecified ankle and joints of unspecified foot: Secondary | ICD-10-CM | POA: Diagnosis not present

## 2024-08-14 DIAGNOSIS — M542 Cervicalgia: Secondary | ICD-10-CM | POA: Diagnosis not present

## 2024-08-14 DIAGNOSIS — D509 Iron deficiency anemia, unspecified: Secondary | ICD-10-CM | POA: Diagnosis present

## 2024-08-14 DIAGNOSIS — M25569 Pain in unspecified knee: Secondary | ICD-10-CM | POA: Diagnosis not present

## 2024-08-14 DIAGNOSIS — M25519 Pain in unspecified shoulder: Secondary | ICD-10-CM | POA: Diagnosis not present

## 2024-08-14 LAB — CBC WITH DIFFERENTIAL (CANCER CENTER ONLY)
Abs Immature Granulocytes: 0.02 K/uL (ref 0.00–0.07)
Basophils Absolute: 0.1 K/uL (ref 0.0–0.1)
Basophils Relative: 1 %
Eosinophils Absolute: 0.1 K/uL (ref 0.0–0.5)
Eosinophils Relative: 2 %
HCT: 41 % (ref 36.0–46.0)
Hemoglobin: 13.4 g/dL (ref 12.0–15.0)
Immature Granulocytes: 0 %
Lymphocytes Relative: 16 %
Lymphs Abs: 0.9 K/uL (ref 0.7–4.0)
MCH: 30 pg (ref 26.0–34.0)
MCHC: 32.7 g/dL (ref 30.0–36.0)
MCV: 91.7 fL (ref 80.0–100.0)
Monocytes Absolute: 0.4 K/uL (ref 0.1–1.0)
Monocytes Relative: 7 %
Neutro Abs: 4 K/uL (ref 1.7–7.7)
Neutrophils Relative %: 74 %
Platelet Count: 376 K/uL (ref 150–400)
RBC: 4.47 MIL/uL (ref 3.87–5.11)
RDW: 14.6 % (ref 11.5–15.5)
WBC Count: 5.5 K/uL (ref 4.0–10.5)
nRBC: 0 % (ref 0.0–0.2)

## 2024-08-14 LAB — LACTATE DEHYDROGENASE: LDH: 262 U/L — ABNORMAL HIGH (ref 105–235)

## 2024-08-14 LAB — CMP (CANCER CENTER ONLY)
ALT: 15 U/L (ref 0–44)
AST: 27 U/L (ref 15–41)
Albumin: 4.4 g/dL (ref 3.5–5.0)
Alkaline Phosphatase: 80 U/L (ref 38–126)
Anion gap: 10 (ref 5–15)
BUN: 12 mg/dL (ref 8–23)
CO2: 26 mmol/L (ref 22–32)
Calcium: 9.1 mg/dL (ref 8.9–10.3)
Chloride: 107 mmol/L (ref 98–111)
Creatinine: 0.76 mg/dL (ref 0.44–1.00)
GFR, Estimated: >60 mL/min (ref >60)
Glucose, Bld: 99 mg/dL (ref 70–99)
Potassium: 4.2 mmol/L (ref 3.5–5.1)
Sodium: 143 mmol/L (ref 135–145)
Total Bilirubin: 0.7 mg/dL (ref 0.0–1.2)
Total Protein: 7 g/dL (ref 6.5–8.1)

## 2024-08-14 NOTE — Progress Notes (Signed)
 [facility] [phone]   [phone]  OFFICE PROGRESS NOTE  Ruth Motto, DO [address] [place] [address]  DIAGNOSIS: Essential Thrombocythemia. [name] 2 mutation positive.    PRIOR THERAPY: None   CURRENT THERAPY: 1) Hydroxyurea  1000 mg alternating with 500 mg every other day . First dose on 07/03/20 started as 500 mg p.o. daily. 2) Integra plus  1 tablet p.o. daily  INTERVAL HISTORY: Ruth Sharp 73 y.o. female returns to the clinic today for follow-up visit.Discussed the use of AI scribe software for clinical note transcription with the patient, who gave verbal consent to proceed.  History of Present Illness Ruth Sharp is a 73 year old female with essential thrombocythemia who presents for evaluation and repeat blood work.  She is currently on hydroxyurea , alternating between 500 mg and 1000 mg every other day, with no reported side effects such as bleeding. Recent lab work shows a platelet count of 375,000, hemoglobin levels, and a white blood cell count. LDH levels are above normal.  She takes gentlease one capsule per odd day for deficiency anemia.  She spent Thanksgiving with her sister and a couple of cousins, indicating a small family gathering. She feels good with no complaints or side effects from her current medication regimen, including no bleeding or other issues.      MEDICAL HISTORY: Past Medical History:  Diagnosis Date   Arthritis    Tachycardia     ALLERGIES:  is allergic to amoxicillin, keflex [cephalexin], and tape.  MEDICATIONS:  Current Outpatient Medications  Medication Sig Dispense Refill   atorvastatin  (LIPITOR) 20 MG tablet Take 1 tablet (20 mg total) by mouth daily. 90 tablet 4   Bacillus Coagulans-Inulin (PROBIOTIC) 1-250 BILLION-MG CAPS Take 1 capsule by mouth daily. Pt states uses Arloa Prior brand     Calcium  Carbonate-Vitamin D (CALTRATE 600+D PO) Take 1 tablet by mouth daily.      hydroxychloroquine  (PLAQUENIL ) 200 MG tablet Take 2 tablets (400 mg total) by mouth daily. 90 tablet 3   hydroxyurea  (HYDREA ) 500 MG capsule TAKE 1 CAPSULE BY MOUTH EVERY OTHER DAY. ALTERNATING TAKE 2 CAPSULES AS DIRECTED. 135 capsule 1   metoprolol  tartrate (LOPRESSOR ) 50 MG tablet Take 1.5 tablets (75 mg total) by mouth 2 (two) times daily. 225 tablet 4   Multiple Vitamins-Minerals (CENTRUM SILVER) CHEW Chew 1 each by mouth daily.     sertraline (ZOLOFT) 50 MG tablet Take 100 mg by mouth daily.     TURMERIC PO Take by mouth.     TYRVAYA 0.03 MG/ACT SOLN Place 1 spray into both nostrils 2 (two) times daily. Per opthamology     No current facility-administered medications for this visit.    SURGICAL HISTORY:  Past Surgical History:  Procedure Laterality Date   ABDOMINAL HYSTERECTOMY     BREAST EXCISIONAL BIOPSY Right    benign   knee replacements      REVIEW OF SYSTEMS:  A comprehensive review of systems was negative.   PHYSICAL EXAMINATION: General appearance: alert, cooperative, and no distress Head: Normocephalic, without obvious abnormality, atraumatic Neck: no adenopathy, no JVD, supple, symmetrical, trachea midline, and thyroid  not enlarged, symmetric, no tenderness/mass/nodules Lymph nodes: Cervical, supraclavicular, and axillary nodes normal. Resp: clear to auscultation bilaterally Back: symmetric, no curvature. ROM normal. No CVA tenderness. Cardio: regular rate and rhythm, S1, S2 normal, no murmur, click, rub or gallop GI: soft, non-tender; bowel sounds normal; no masses,  no organomegaly Extremities: extremities  normal, atraumatic, no cyanosis or edema  ECOG PERFORMANCE STATUS: 0 - Asymptomatic  Blood pressure 112/76, pulse 69, temperature (!) 97.2 F (36.2 C), temperature source Temporal, resp. rate 17, height 5' 7.5 (1.715 m), weight 184 lb (83.5 kg), SpO2 97%.  LABORATORY DATA: Lab Results  Component Value Date   WBC 5.5 08/14/2024   HGB 13.4 08/14/2024   HCT  41.0 08/14/2024   MCV 91.7 08/14/2024   PLT 376 08/14/2024      Chemistry      Component Value Date/Time   NA 141 04/24/2024 1120   NA 142 04/20/2017 1033   K 4.4 04/24/2024 1120   CL 108 04/24/2024 1120   CO2 29 04/24/2024 1120   BUN 16 04/24/2024 1120   BUN 16 04/20/2017 1033   CREATININE 0.90 04/24/2024 1120      Component Value Date/Time   CALCIUM  9.1 04/24/2024 1120   ALKPHOS 71 04/24/2024 1120   AST 20 04/24/2024 1120   ALT 14 04/24/2024 1120   BILITOT 0.7 04/24/2024 1120       RADIOGRAPHIC STUDIES: No results found.   ASSESSMENT AND PLAN: This is a very pleasant 73 years old white female recently diagnosed with essential thrombocythemia as well as iron deficiency anemia.  The patient is currently on treatment with hydroxyurea  1000 mg p.o. alternating with 500 mg every other day in addition to Integra +1 capsule p.o. daily. The patient has been tolerating this treatment fairly well. Repeat CBC today showed platelets count of 376,000 with normal hemoglobin, hematocrit and white blood count.  LDH is slightly elevated. Assessment and Plan Assessment & Plan Essential thrombocythemia with [name]2 mutation Well-managed with hydroxyurea . Platelet count is 375,000, within acceptable range. Hemoglobin and white blood count are normal. LDH is elevated but stable. No side effects from hydroxyurea  reported. - Continue hydroxyurea  1000 mg one day and 500 mg the next day. - Scheduled follow-up in three months.  Deficiency anemia No new symptoms or complications reported. - Continue gentlease one capsule every other day. The patient was advised to call immediately if she has any concerning symptoms in the interval. The patient voices understanding of current disease status and treatment options and is in agreement with the current care plan.  All questions were answered. The patient knows to call the clinic with any problems, questions or concerns. We can certainly see the patient  much sooner if necessary.  Disclaimer: This note was dictated with voice recognition software. Similar sounding words can inadvertently be transcribed and may not be corrected upon review.

## 2024-08-21 DIAGNOSIS — M25569 Pain in unspecified knee: Secondary | ICD-10-CM | POA: Diagnosis not present

## 2024-08-21 DIAGNOSIS — M25519 Pain in unspecified shoulder: Secondary | ICD-10-CM | POA: Diagnosis not present

## 2024-08-21 DIAGNOSIS — M542 Cervicalgia: Secondary | ICD-10-CM | POA: Diagnosis not present

## 2024-08-21 DIAGNOSIS — M25579 Pain in unspecified ankle and joints of unspecified foot: Secondary | ICD-10-CM | POA: Diagnosis not present

## 2024-08-21 DIAGNOSIS — M545 Low back pain, unspecified: Secondary | ICD-10-CM | POA: Diagnosis not present

## 2024-08-28 DIAGNOSIS — M25569 Pain in unspecified knee: Secondary | ICD-10-CM | POA: Diagnosis not present

## 2024-08-28 DIAGNOSIS — M542 Cervicalgia: Secondary | ICD-10-CM | POA: Diagnosis not present

## 2024-08-28 DIAGNOSIS — M545 Low back pain, unspecified: Secondary | ICD-10-CM | POA: Diagnosis not present

## 2024-08-28 DIAGNOSIS — G4733 Obstructive sleep apnea (adult) (pediatric): Secondary | ICD-10-CM | POA: Diagnosis not present

## 2024-08-28 DIAGNOSIS — F341 Dysthymic disorder: Secondary | ICD-10-CM | POA: Diagnosis not present

## 2024-08-28 DIAGNOSIS — M25519 Pain in unspecified shoulder: Secondary | ICD-10-CM | POA: Diagnosis not present

## 2024-08-28 DIAGNOSIS — M25579 Pain in unspecified ankle and joints of unspecified foot: Secondary | ICD-10-CM | POA: Diagnosis not present

## 2024-09-21 ENCOUNTER — Telehealth: Payer: Self-pay | Admitting: Neurology

## 2024-09-21 NOTE — Telephone Encounter (Signed)
 Pt has called and scheduled her initial CPAP f/u, pt aware to bring CPAP and power cord

## 2024-09-25 NOTE — Progress Notes (Unsigned)
 SABRA

## 2024-09-26 ENCOUNTER — Encounter: Payer: Self-pay | Admitting: Adult Health

## 2024-09-26 ENCOUNTER — Ambulatory Visit: Admitting: Adult Health

## 2024-09-26 ENCOUNTER — Telehealth: Payer: Self-pay | Admitting: *Deleted

## 2024-09-26 VITALS — BP 112/69 | HR 61 | Ht 67.0 in | Wt 190.0 lb

## 2024-09-26 DIAGNOSIS — G4733 Obstructive sleep apnea (adult) (pediatric): Secondary | ICD-10-CM | POA: Diagnosis not present

## 2024-09-26 NOTE — Progress Notes (Signed)
 "   PATIENT: Ruth Sharp DOB: 1951-04-18  REASON FOR VISIT: follow up HISTORY FROM: patient PRIMARY NEUROLOGIST: Dr. Buck  Chief Complaint  Patient presents with   Follow-up    Patient in room 4 alone. Patient is here for cpap follow up. Patient states no issues or concerns at the moment.  ESS score is 6 FSS score is 15     HISTORY OF PRESENT ILLNESS: Today 09/26/2024:  Ruth Sharp is a 74 y.o. female with a history of obstructive sleep apnea on CPAP.  She had a sleep study March 14, 2024 that showed mild sleep apnea but significant oxygen desaturation.  She is here for her initial CPAP compliance visit.  She feels that the CPAP has been beneficial.  She does not wake up as groggy.  She states that she has noticed some improvement in her memory.  Reports that she is not having as many problems with word finding but still has trouble with people's names.  She did have neuropsychological testing with Dr. Hayden.  No organic memory cause identified.  Patient uses the DreamWear mask.  She is curious if her oxygen saturation is better with the CPAP.  Her download is below.     HISTORY   REVIEW OF SYSTEMS: Out of a complete 14 system review of symptoms, the patient complains only of the following symptoms, and all other reviewed systems are negative.  FSS ESS  ALLERGIES: Allergies[1]  HOME MEDICATIONS: Outpatient Medications Prior to Visit  Medication Sig Dispense Refill   atorvastatin  (LIPITOR) 20 MG tablet Take 1 tablet (20 mg total) by mouth daily. 90 tablet 4   Bacillus Coagulans-Inulin (PROBIOTIC) 1-250 BILLION-MG CAPS Take 1 capsule by mouth daily. Pt states uses Arloa Prior brand     Calcium  Carbonate-Vitamin D (CALTRATE 600+D PO) Take 1 tablet by mouth daily.     hydroxychloroquine  (PLAQUENIL ) 200 MG tablet Take 2 tablets (400 mg total) by mouth daily. 90 tablet 3   hydroxyurea  (HYDREA ) 500 MG capsule TAKE 1 CAPSULE BY MOUTH EVERY OTHER DAY. ALTERNATING TAKE 2  CAPSULES AS DIRECTED. 135 capsule 1   metoprolol  tartrate (LOPRESSOR ) 50 MG tablet Take 1.5 tablets (75 mg total) by mouth 2 (two) times daily. 225 tablet 4   Multiple Vitamins-Minerals (CENTRUM SILVER) CHEW Chew 1 each by mouth daily.     sertraline (ZOLOFT) 50 MG tablet Take 100 mg by mouth daily.     TURMERIC PO Take by mouth.     TYRVAYA 0.03 MG/ACT SOLN Place 1 spray into both nostrils 2 (two) times daily. Per opthamology     No facility-administered medications prior to visit.    PAST MEDICAL HISTORY: Past Medical History:  Diagnosis Date   Arthritis    Tachycardia     PAST SURGICAL HISTORY: Past Surgical History:  Procedure Laterality Date   ABDOMINAL HYSTERECTOMY     BREAST EXCISIONAL BIOPSY Right    benign   knee replacements      FAMILY HISTORY: Family History  Problem Relation Age of Onset   Heart failure Mother    Tremor Mother    Parkinson's disease Neg Hx    Alzheimer's disease Neg Hx    Dementia Neg Hx     SOCIAL HISTORY: Social History   Socioeconomic History   Marital status: Divorced    Spouse name: Not on file   Number of children: Not on file   Years of education: Not on file   Highest education level: Not on file  Occupational History   Not on file  Tobacco Use   Smoking status: Never   Smokeless tobacco: Never  Vaping Use   Vaping status: Never Used  Substance and Sexual Activity   Alcohol use: Yes    Comment: occasionally   Drug use: Never   Sexual activity: Not on file  Other Topics Concern   Not on file  Social History Narrative   Pt lives alone    Retired    Social Drivers of Health   Tobacco Use: Low Risk (06/20/2024)   Patient History    Smoking Tobacco Use: Never    Smokeless Tobacco Use: Never    Passive Exposure: Not on file  Financial Resource Strain: Not on file  Food Insecurity: No Food Insecurity (08/14/2024)   Epic    Worried About Programme Researcher, Broadcasting/film/video in the Last Year: Never true    Ran Out of Food in the Last  Year: Never true  Transportation Needs: No Transportation Needs (08/14/2024)   Epic    Lack of Transportation (Medical): No    Lack of Transportation (Non-Medical): No  Physical Activity: Not on file  Stress: Not on file  Social Connections: Not on file  Intimate Partner Violence: Not At Risk (08/14/2024)   Epic    Fear of Current or Ex-Partner: No    Emotionally Abused: No    Physically Abused: No    Sexually Abused: No  Depression (PHQ2-9): Low Risk (08/14/2024)   Depression (PHQ2-9)    PHQ-2 Score: 0  Alcohol Screen: Not on file  Housing: Low Risk (08/14/2024)   Epic    Unable to Pay for Housing in the Last Year: No    Number of Times Moved in the Last Year: 0    Homeless in the Last Year: No  Utilities: Not At Risk (08/14/2024)   Epic    Threatened with loss of utilities: No  Health Literacy: Not on file      PHYSICAL EXAM  Vitals:   09/26/24 1457  BP: 112/69  Pulse: 61  Weight: 190 lb (86.2 kg)  Height: 5' 7 (1.702 m)   Body mass index is 29.76 kg/m.  Generalized: Well developed, in no acute distress  Chest: Lungs clear to auscultation bilaterally  Neurological examination  Mentation: Alert oriented to time, place, history taking. Follows all commands speech and language fluent Cranial nerve II-XII: Facial symmetry noted   DIAGNOSTIC DATA (LABS, IMAGING, TESTING) - I reviewed patient records, labs, notes, testing and imaging myself where available.  Lab Results  Component Value Date   WBC 5.5 08/14/2024   HGB 13.4 08/14/2024   HCT 41.0 08/14/2024   MCV 91.7 08/14/2024   PLT 376 08/14/2024      Component Value Date/Time   NA 143 08/14/2024 0918   NA 142 04/20/2017 1033   K 4.2 08/14/2024 0918   CL 107 08/14/2024 0918   CO2 26 08/14/2024 0918   GLUCOSE 99 08/14/2024 0918   BUN 12 08/14/2024 0918   BUN 16 04/20/2017 1033   CREATININE 0.76 08/14/2024 0918   CALCIUM  9.1 08/14/2024 0918   PROT 7.0 08/14/2024 0918   PROT 6.6 01/13/2023 0953    ALBUMIN 4.4 08/14/2024 0918   ALBUMIN 4.5 01/13/2023 0953   AST 27 08/14/2024 0918   ALT 15 08/14/2024 0918   ALKPHOS 80 08/14/2024 0918   BILITOT 0.7 08/14/2024 0918   GFRNONAA >60 08/14/2024 0918   GFRAA 57 (L) 06/11/2020 1251   Lab Results  Component  Value Date   CHOL 159 01/13/2023   HDL 93 01/13/2023   LDLCALC 53 01/13/2023   TRIG 66 01/13/2023   CHOLHDL 1.7 01/13/2023   Lab Results  Component Value Date   HGBA1C 5.9 (H) 04/20/2017   Lab Results  Component Value Date   VITAMINB12 582 02/28/2024   Lab Results  Component Value Date   TSH 2.400 02/28/2024      ASSESSMENT AND PLAN 74 y.o. year old female  has a past medical history of Arthritis and Tachycardia. here with:  OSA on CPAP  - CPAP compliance excellent - Good treatment of AHI  - Encourage patient to use CPAP nightly and > 4 hours each night -Will do an overnight pulse oximetry to ensure her oxygen levels have improved on CPAP - F/U in 1 year or sooner if needed  Orders Placed This Encounter  Procedures   Pulse oximetry, overnight    While on CPAP    Standing Status:   Future    Expiration Date:   09/26/2025     Duwaine Russell, MSN, NP-C 09/26/2024, 2:57 PM [facility] [address], [address] [place], [place] [address] [phone]      [1]  Allergies Allergen Reactions   Amoxicillin Diarrhea    Pt said she cannot take Amoxicillin  due to diarrhea   Keflex [Cephalexin] Rash   Tape Rash   "

## 2024-09-26 NOTE — Telephone Encounter (Signed)
-----   Message from Duwaine Russell, NP sent at 09/26/2024  3:55 PM EST ----- Please send overnight pulse oximetry order to DME

## 2024-09-26 NOTE — Patient Instructions (Signed)
 Continue using CPAP nightly and greater than 4 hours each night Overnight pulse ox test If your symptoms worsen or you develop new symptoms please let us  know.

## 2024-11-13 ENCOUNTER — Inpatient Hospital Stay: Admitting: Internal Medicine

## 2024-11-13 ENCOUNTER — Inpatient Hospital Stay: Attending: Internal Medicine

## 2025-09-10 ENCOUNTER — Ambulatory Visit: Admitting: Adult Health
